# Patient Record
Sex: Male | Born: 1950 | Race: White | Hispanic: No | Marital: Married | Smoking: Never smoker
Health system: Southern US, Community
[De-identification: ages and names within clinical notes are randomized; demographics above are authoritative.]

## PROBLEM LIST (undated history)

## (undated) DIAGNOSIS — M1990 Unspecified osteoarthritis, unspecified site: Secondary | ICD-10-CM

## (undated) DIAGNOSIS — Z87438 Personal history of other diseases of male genital organs: Secondary | ICD-10-CM

## (undated) DIAGNOSIS — H269 Unspecified cataract: Secondary | ICD-10-CM

## (undated) DIAGNOSIS — N529 Male erectile dysfunction, unspecified: Secondary | ICD-10-CM

## (undated) DIAGNOSIS — K635 Polyp of colon: Secondary | ICD-10-CM

## (undated) DIAGNOSIS — C4492 Squamous cell carcinoma of skin, unspecified: Secondary | ICD-10-CM

## (undated) DIAGNOSIS — I2510 Atherosclerotic heart disease of native coronary artery without angina pectoris: Secondary | ICD-10-CM

## (undated) DIAGNOSIS — K219 Gastro-esophageal reflux disease without esophagitis: Secondary | ICD-10-CM

## (undated) DIAGNOSIS — A6920 Lyme disease, unspecified: Secondary | ICD-10-CM

## (undated) HISTORY — DX: Atherosclerotic heart disease of native coronary artery without angina pectoris: I25.10

## (undated) HISTORY — DX: Lyme disease, unspecified: A69.20

## (undated) HISTORY — DX: Squamous cell carcinoma of skin, unspecified: C44.92

## (undated) HISTORY — PX: UPPER GASTROINTESTINAL ENDOSCOPY: SHX188

## (undated) HISTORY — DX: Male erectile dysfunction, unspecified: N52.9

## (undated) HISTORY — DX: Polyp of colon: K63.5

## (undated) HISTORY — DX: Gastro-esophageal reflux disease without esophagitis: K21.9

## (undated) HISTORY — DX: Unspecified cataract: H26.9

## (undated) HISTORY — DX: Personal history of other diseases of male genital organs: Z87.438

## (undated) HISTORY — DX: Unspecified osteoarthritis, unspecified site: M19.90

## (undated) HISTORY — PX: TONSILLECTOMY: SUR1361

---

## 1990-08-16 HISTORY — PX: LUMBAR DISC SURGERY: SHX700

## 1995-08-17 HISTORY — PX: VARICOSE VEIN SURGERY: SHX832

## 2000-08-16 HISTORY — PX: TUMOR REMOVAL: SHX12

## 2004-08-16 HISTORY — PX: HEMICOLECTOMY: SHX854

## 2005-02-05 ENCOUNTER — Encounter: Payer: Self-pay | Admitting: Gastroenterology

## 2005-02-15 ENCOUNTER — Encounter: Payer: Self-pay | Admitting: Gastroenterology

## 2005-03-01 ENCOUNTER — Encounter: Payer: Self-pay | Admitting: Gastroenterology

## 2005-11-22 ENCOUNTER — Encounter: Payer: Self-pay | Admitting: Gastroenterology

## 2005-11-26 ENCOUNTER — Encounter: Payer: Self-pay | Admitting: Gastroenterology

## 2006-08-16 HISTORY — PX: HERNIA REPAIR: SHX51

## 2008-09-26 ENCOUNTER — Ambulatory Visit: Payer: Self-pay | Admitting: Family Medicine

## 2008-09-26 DIAGNOSIS — J309 Allergic rhinitis, unspecified: Secondary | ICD-10-CM | POA: Insufficient documentation

## 2008-09-26 DIAGNOSIS — K219 Gastro-esophageal reflux disease without esophagitis: Secondary | ICD-10-CM

## 2008-09-26 DIAGNOSIS — Z860100 Personal history of colon polyps, unspecified: Secondary | ICD-10-CM | POA: Insufficient documentation

## 2008-09-26 DIAGNOSIS — F528 Other sexual dysfunction not due to a substance or known physiological condition: Secondary | ICD-10-CM

## 2008-09-26 DIAGNOSIS — R5383 Other fatigue: Secondary | ICD-10-CM

## 2008-09-26 DIAGNOSIS — R5381 Other malaise: Secondary | ICD-10-CM | POA: Insufficient documentation

## 2008-12-02 ENCOUNTER — Ambulatory Visit: Payer: Self-pay | Admitting: Family Medicine

## 2008-12-02 LAB — CONVERTED CEMR LAB
ALT: 18 units/L (ref 0–53)
BUN: 12 mg/dL (ref 6–23)
Basophils Absolute: 0 10*3/uL (ref 0.0–0.1)
Basophils Relative: 0.3 % (ref 0.0–3.0)
CO2: 30 meq/L (ref 19–32)
Chloride: 109 meq/L (ref 96–112)
Cholesterol: 118 mg/dL (ref 0–200)
Creatinine, Ser: 1.1 mg/dL (ref 0.4–1.5)
HCT: 42.6 % (ref 39.0–52.0)
Hemoglobin: 14.3 g/dL (ref 13.0–17.0)
Lymphs Abs: 1.1 10*3/uL (ref 0.7–4.0)
MCHC: 33.7 g/dL (ref 30.0–36.0)
Monocytes Relative: 8.3 % (ref 3.0–12.0)
Neutro Abs: 2 10*3/uL (ref 1.4–7.7)
RDW: 13.8 % (ref 11.5–14.6)
Total CHOL/HDL Ratio: 6
Total Protein: 6.7 g/dL (ref 6.0–8.3)
Triglycerides: 98 mg/dL (ref 0.0–149.0)

## 2008-12-14 HISTORY — PX: ESOPHAGOGASTRODUODENOSCOPY: SHX1529

## 2008-12-14 HISTORY — PX: COLONOSCOPY: SHX174

## 2008-12-16 ENCOUNTER — Ambulatory Visit: Payer: Self-pay | Admitting: Family Medicine

## 2008-12-16 DIAGNOSIS — K2270 Barrett's esophagus without dysplasia: Secondary | ICD-10-CM

## 2008-12-16 HISTORY — DX: Barrett's esophagus without dysplasia: K22.70

## 2009-01-03 ENCOUNTER — Ambulatory Visit: Payer: Self-pay | Admitting: Gastroenterology

## 2009-01-08 ENCOUNTER — Ambulatory Visit: Payer: Self-pay | Admitting: Gastroenterology

## 2009-01-08 ENCOUNTER — Encounter: Payer: Self-pay | Admitting: Gastroenterology

## 2009-01-13 ENCOUNTER — Encounter: Payer: Self-pay | Admitting: Gastroenterology

## 2009-04-07 ENCOUNTER — Ambulatory Visit: Payer: Self-pay | Admitting: Family Medicine

## 2009-04-07 DIAGNOSIS — H6980 Other specified disorders of Eustachian tube, unspecified ear: Secondary | ICD-10-CM

## 2010-03-30 ENCOUNTER — Encounter: Payer: Self-pay | Admitting: Family Medicine

## 2010-03-31 ENCOUNTER — Ambulatory Visit: Payer: Self-pay | Admitting: Internal Medicine

## 2010-03-31 DIAGNOSIS — R0789 Other chest pain: Secondary | ICD-10-CM | POA: Insufficient documentation

## 2010-04-01 ENCOUNTER — Ambulatory Visit: Payer: Self-pay | Admitting: Cardiovascular Disease

## 2010-09-15 NOTE — Assessment & Plan Note (Signed)
Summary: 2:15 chest pain x 1 week/alc   Vital Signs:  Patient profile:   60 year old male Height:      73 inches Weight:      236 pounds BMI:     31.25 Temp:     98.1 degrees F oral Pulse rate:   60 / minute Pulse rhythm:   regular BP sitting:   138 / 80  (left arm) Cuff size:   large  Vitals Entered By: Selena Batten Dance CMA Duncan Dull) (March 31, 2010 12:02 PM) CC: Chest pain x1 week (intermitant)   History of Present Illness: CC: chest pain  60 yo with h/o GERD with barrett's presents with 1 wk h/o intermittent chest pain.  Dull in nature, substernal.  Occasional twinge in jaw, no pain down arm.  No nausea/sweating/SOB.  Not exertional, not relieved by rest.  Somewhat positional.  Usually occurs at rest.  Lasts a few hours when he does get it.  In AM completely pain free.  Has tried naprosyn, aspirin.  currently 1-2/10 dull pain.  at worst, 4/10.  Risk factors - current BP today 138/80.  + father with MI/CAD age 41yo (obesity), uncles with MIs.  No smoking.  No HTN, HLD.  + low HDL 22.  No DM, CRI.  Had nuclear study done 10 years ago for similar sxs, normal per patient.  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  + mobic really helped in past.  + right hand swelling in AM.  GERD usually controlled with 40 mg omeprazole daily.  No recent GERD flares.  1 wk ago had bug bite right leg that blistered then scabbed, then cluster of blisters developed right inner thigh, now returning to normal.  No ticks seen.  Not pruritic.  No other rashes.  + C spine arthralgias.  No knee or other joint pains.  No HA, abd pain, nausea/vomiting, changes in BM.  Allergies: 1)  Pcn  Past History:  Past medical, surgical, family and social histories (including risk factors) reviewed for relevance to current acute and chronic problems.  Past Medical History: Reviewed history from 01/03/2009 and no changes required. Arthritis Allergic rhinitis colon polyps ( severe dysplasia in a  right-sided polyp removed surgically in [place], 2006, final pathology showed this was not a cancer), followup colonoscopy at one-year interval was normal. GERD, h/o Barrett's esopphagus, originally diagnosed 2006, biopsies showed no dysplasia.. followup EGD 2007 also showed no dysplasia L4-L5 disc 1992 Lyme Disease x 2 Prostate infection Erectile Dysfunction h/o BPH  Past Surgical History: Reviewed history from 01/15/2009 and no changes required. Tonsillectomy (as a child) Hernia surgery 2008 Fatty tumor (neck) 2002 L4-5 diskectomy, 1992 Varicose vein removal, 1997, R right hemicolectomy 2006 for severely dysplastic right colon polyp  Colonoscopy 12/2008, repeat 5 years EGD, 12/2008, Barrett's, repeat 3 years  Family History: Reviewed history from 01/03/2009 and no changes required. Family History Diabetes 1st degree relative (parent) Family History Hypertension (parent) Fam Hx of Lymphoma(parent) Fam Hx of Heart Disease(parent) Family History of Alcoholism/Addiction (grandparent)   Social History: Reviewed history from 01/03/2009 and no changes required. Occupation:Pastor, [employer] Married Never Smoked Alcohol use-no Drug use-no  Regular exercise-yes  Review of Systems       per HPI  Physical Exam  General:  Well-developed,well-nourished,in no acute distress; alert,appropriate and cooperative throughout examination Head:  Normocephalic and atraumatic without obvious abnormalities. No apparent alopecia or balding. Eyes:  No corneal or conjunctival inflammation noted. EOMI. Perrla.  Mouth:  Oral  mucosa and oropharynx without lesions or exudates.  Teeth in good repair. Neck:  No deformities, masses, or tenderness noted.  no bruits, no LAD Lungs:  Normal respiratory effort, chest expands symmetrically. Lungs are clear to auscultation, no crackles or wheezes. Heart:  Normal rate and regular rhythm. S1 and S2 normal without gallop, murmur, click, rub or other extra  sounds. Abdomen:  Bowel sounds positive,abdomen soft and non-tender without masses, organomegaly or hernias noted.  + tender epigastric region to palpation Pulses:  2+ periph pulses Extremities:  No clubbing, cyanosis, edema, or deformity noted with normal full range of motion of all joints.   Skin:  right inner leg with scabbed blister, right inner thigh with cluster of rubptured blisters with dy blood, not erythematous, not pruritic.   Impression & Recommendations:  Problem # 1:  CHEST PAIN, ATYPICAL (ICD-786.59) atypical sxs in patient with low cardiac risk.  GI cocktail didn't change sxs so doubt coming from GI system.  Sounds more MSK given positional and worse pain in thoracic back.  Pain not consistent with dissecting aorta.  However, given age and low HDL, send to Cards for ETT this week.  Red flags to seek urgent medical care reviewed.  Trial of flexeril and mobic once cardiac r/o.  EKG - sinus bradycardia, normal axis, intervals, no hypertrophy, no infarct.  Orders: EKG w/ Interpretation (93000) EMR miscellaneous medications [facility]) Cardiology Referral (Cardiology)  Complete Medication List: 1)  Omeprazole 40 Mg Cpdr (Omeprazole) .... Take 1 tablet by mouth once a day 2)  Folic Acid 1 Mg Tabs (Folic acid) .... Take 1 tablet by mouth once a day 3)  Saw Palmetto 80 Mg Caps (Saw palmetto (serenoa repens)) .... One tablet by mouth once daily 4)  Mobic 7.5 Mg Tabs (Meloxicam) .... One daily for back pain 5)  Cyclobenzaprine Hcl 10 Mg Tabs (Cyclobenzaprine hcl) .... One by mouth two times a day as needed muscle spasm  Patient Instructions: 1)  It doesn't sound like it is the heart, but we will set you up with referral for cardiology appointment for an exercise stress test. 2)  If that one is low risk, we can try trial of mobic for the pain.  flexeril for back.  (prescriptions given today) 3)  Pleasure to see you today.  Call clinic with questions. Prescriptions: CYCLOBENZAPRINE  HCL 10 MG TABS (CYCLOBENZAPRINE HCL) one by mouth two times a day as needed muscle spasm  #30 x 0   Entered and Authorized by:   Eustaquio Boyden  MD   Signed by:   Eustaquio Boyden  MD on 03/31/2010   Method used:   Print then Give to Patient   RxID:   [id number] MOBIC 7.5 MG TABS (MELOXICAM) one daily for back pain  #30 x 0   Entered and Authorized by:   Eustaquio Boyden  MD   Signed by:   Eustaquio Boyden  MD on 03/31/2010   Method used:   Print then Give to Patient   RxID:   [id number]   Current Allergies (reviewed today): PCN   Medication Administration  Medication # 1:    Medication: EMR miscellaneous medications    Diagnosis: CHEST PAIN, ATYPICAL (ICD-786.59)    Dose: 45cc    Route: oral    Exp Date: 04/30/2010    Comments: GI Cocktail (30cc Mylanta, 5cc Benadryl, 10cc Donnatol) per Dr. Sharen Hones    Patient tolerated medication without complications    Given by: Selena Batten Dance CMA Duncan Dull) (March 31, 2010 1:11  PM)  Orders Added: 1)  EKG w/ Interpretation [93000] 2)  Est. Patient Level IV [81191] 3)  EMR miscellaneous medications [EMRORAL] 4)  Cardiology Referral [Cardiology]

## 2010-09-15 NOTE — Assessment & Plan Note (Signed)
Summary: NP6/AMD   Visit Type:  Initial Consult Referring Alliyah Roesler:  Hannah Beat, MD Primary Athol Bolds:  Hannah Beat, MD  CC:  c/o chest pain off & on for the past week or so.Marland Kitchen  History of Present Illness: very pleasant 60 year old gentleman with a history of arthritis in his back, colon resection and ventral hernia repair in 2006, EGD at that time showing Barrett's esophagus, also history of GERD who presents for evaluation of recent chest discomfort.  He actually thought that he had a treadmill scheduled today but this was not mentioned to our office. He reports having dull, upper chest discomfort for one week that has been waxing and waning, 3-4/10 in intensity. It comes on with rest, is not exacerbated by exertion. Rarely he has gone to his jaw. He has otherwise been active with no complaints. Typically he plays golf with no symptoms of chest discomfort.   He does not believe that his symptoms are due to GI etiology. He strongly believes it might be due to arthritic pain from his back. 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  cholesterol is at an excellent level on no medications with total cholesterol less than 150   Current Medications (verified): 1)  Omeprazole 40 Mg Cpdr (Omeprazole) .... Take 1 Tablet By Mouth Once A Day 2)  Folic Acid 1 Mg Tabs (Folic Acid) .... Take 1 Tablet By Mouth Once A Day 3)  Saw Palmetto 80 Mg Caps (Saw Palmetto (Serenoa Repens)) .... One Tablet By Mouth Once Daily 4)  Mobic 7.5 Mg Tabs (Meloxicam) .... One Daily For Back Pain 5)  Cyclobenzaprine Hcl 10 Mg Tabs (Cyclobenzaprine Hcl) .... One By Mouth Two Times A Day As Needed Muscle Spasm  Allergies (verified): 1)  Pcn  Past History:  Past Medical History: Last updated: 01/03/2009 Arthritis Allergic rhinitis colon polyps ( severe dysplasia in a right-sided polyp removed surgically in [place], 2006, final  pathology showed this was not a cancer), followup colonoscopy at one-year interval was normal. GERD, h/o Barrett's esopphagus, originally diagnosed 2006, biopsies showed no dysplasia.. followup EGD 2007 also showed no dysplasia L4-L5 disc 1992 Lyme Disease x 2 Prostate infection Erectile Dysfunction h/o BPH  Past Surgical History: Last updated: 01/15/2009 Tonsillectomy (as a child) Hernia surgery 2008 Fatty tumor (neck) 2002 L4-5 diskectomy, 1992 Varicose vein removal, 1997, R right hemicolectomy 2006 for severely dysplastic right colon polyp  Colonoscopy 12/2008, repeat 5 years EGD, 12/2008, Barrett's, repeat 3 years  Family History: Last updated: 01/03/2009 Family History Diabetes 1st degree relative (parent) Family History Hypertension (parent) Fam Hx of Lymphoma(parent) Fam Hx of Heart Disease(parent) Family History of Alcoholism/Addiction (grandparent)   Social History: Last updated: 01/03/2009 Occupation:Pastor, [employer]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  The patient denies fever, weight loss, weight gain, vision loss, decreased hearing, hoarseness, syncope, dyspnea on exertion, peripheral edema, prolonged cough, abdominal pain, incontinence, muscle weakness, depression, and enlarged lymph nodes.    Vital Signs:  Patient profile:   60 year old male Height:      73 inches Weight:      232 pounds BMI:     30.72 Pulse rate:   66 / minute BP sitting:   125 / 81  (right  arm) Cuff size:   regular  Vitals Entered By: Bishop Dublin, CMA (April 01, 2010 11:25 AM)  Physical Exam  General:  Well developed, well nourished, in no acute distress. Head:  normocephalic and atraumatic Neck:  Neck supple, no JVD. No masses, thyromegaly or abnormal  cervical nodes. Lungs:  Clear bilaterally to auscultation and percussion. Heart:  Non-displaced PMI, chest non-tender; regular rate and rhythm, S1, S2 without murmurs, rubs or gallops. Carotid upstroke normal, no bruit. Normal abdominal aortic size, no bruits.  Pedals normal pulses. No edema, no varicosities. Abdomen:   abdomen soft and non-tender without masses,  Msk:  Back normal, normal gait. Muscle strength and tone normal. Pulses:  pulses normal in all 4 extremities Extremities:  No clubbing or cyanosis. Neurologic:  Alert and oriented x 3. Skin:  Intact without lesions or rashes. Psych:  Normal affect.   Impression & Recommendations:  Problem # 1:  CHEST PAIN, ATYPICAL (ICD-786.59) etiology of his chest pain is uncertain. He does have a history of Barrett's esophagus though this was 5 years ago and he reports the symptoms are not similar. His presentation is atypical as it comes on at rest, is not worse with exertion. We have suggested that he try his Mobic for his arthritis of the back. If his symptoms resolve, we could hold off on performing a treadmill study. If he continues to have symptoms on Mobic, we could have him perform a treadmill study at [facility].  It is certainly possible that this could be pericarditis as he does have nonspecific ST changes in the precordial leads, but my suspicion is that this is due to early repolarization abnormality. There is no positional nature to his discomfort per his report, arguing against pericarditis. This would be treated with high-dose ibuprofen or aspirin. Starting Mobic and a recommendation to start low-dose aspirin could help if this is due to pericarditis.   Patient Instructions: 1)  Your physician recommends that you continue on your current medications as directed. Please refer to the Current Medication list given to you today. 2)  Your physician wants you to follow-up in:   6 months You will receive a reminder letter in the mail two  months in advance. If you don't receive a letter, please call our office to schedule the follow-up appointment.

## 2010-09-15 NOTE — Letter (Signed)
Summary: PHI  PHI   Imported By: Harlon Flor 04/02/2010 15:23:17  _____________________________________________________________________  External Attachment:    Type:   Image     Comment:   External Document

## 2011-02-22 ENCOUNTER — Encounter: Payer: Self-pay | Admitting: Cardiovascular Disease

## 2011-09-02 ENCOUNTER — Ambulatory Visit (INDEPENDENT_AMBULATORY_CARE_PROVIDER_SITE_OTHER): Payer: BC Managed Care – PPO | Admitting: Family Medicine

## 2011-09-02 ENCOUNTER — Encounter: Payer: Self-pay | Admitting: Family Medicine

## 2011-09-02 DIAGNOSIS — D696 Thrombocytopenia, unspecified: Secondary | ICD-10-CM

## 2011-09-02 DIAGNOSIS — M2550 Pain in unspecified joint: Secondary | ICD-10-CM

## 2011-09-02 LAB — RHEUMATOID FACTOR: Rhuematoid fact SerPl-aCnc: 12 IU/mL (ref <=14)

## 2011-09-02 LAB — HIGH SENSITIVITY CRP: CRP, High Sensitivity: 1.1 mg/L (ref 0.000–5.000)

## 2011-09-02 LAB — SEDIMENTATION RATE: Sed Rate: 6 mm/hr (ref 0–22)

## 2011-09-02 NOTE — Progress Notes (Signed)
  Patient Name: Phillip [name] Date of Birth: 01-09-1951 Age: 61 y.o. Medical Record Number: [id number] Gender: male Date of Encounter: 09/02/2011  History of Present Illness:  Phillip [name] is a 61 y.o. 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   A few years ago, there was some concerns about RA. Some of the work-up was positive, but right hand has been swelling at night. Stiff in the morning. Will loosen up. Lasting at least an hour but not fully sure on time. WIll have some boggy MCP's Will have some occ flare up. More with gripping.  Even better the last few days.  R > L He was seeing a rheumatologist a number of years ago - at first felt RA, then maybe not  Plays a lot of golf at least a few times a week. Cut some wood the other day.  Also will have some edema occ in the LE. Extensive varicose veins  CBC:    Component Value Date/Time   WBC 3.5* 12/02/2008 1036   HGB 14.3 12/02/2008 1036   HCT 42.6 12/02/2008 1036   PLT 109.0* 12/02/2008 1036   MCV 88.5 12/02/2008 1036   NEUTROABS 2.0 12/02/2008 1036   LYMPHSABS 1.1 12/02/2008 1036   MONOABS 0.3 12/02/2008 1036   EOSABS 0.1 12/02/2008 1036   BASOSABS 0.0 12/02/2008 1036   at fire dept physical, had thrombocytopenia with normal # WBC, normal distribution of WBC types.  Mom died of lymphoma at 24.  Spot on the back on his left ear. Recent boil  Spot in abdomen. Not particularly painful, s/p hernia repair with mesh. Rare mild pain but no bulging.  Past Medical History, Surgical History, Social History, Family History, Problem List, Medications, and Allergies have been reviewed and updated if relevant.  Review of Systems: As above. No cp, sob. No fever.   Physical Examination: Filed Vitals:   09/02/11 1350  BP: 130/82  Pulse: 63  Temp: 98.9 F (37.2 C)  TempSrc: Oral  Height: 6\' 1"  (1.854 m)  Weight: 232 lb 12.8 oz (105.597 kg)  SpO2: 100%    Body mass index  is 30.71 kg/(m^2).   GEN: WDWN, NAD, Non-toxic, A & O x 3 HEENT: Atraumatic, Normocephalic. Neck supple. No masses, No LAD. Ears and Nose: No external deformity. CV: RRR, No M/G/R. No JVD. No thrill. No extra heart sounds. PULM: CTA B, no wheezes, crackles, rhonchi. No retractions. No resp. distress. No accessory muscle use. MSK: R MCP's are boggy with moderate synovitis, 2-5. None at DIP or PIP. Normal wrist ROM EXTR: No c/c/e NEURO Normal gait.  PSYCH: Normally interactive. Conversant. Not depressed or anxious appearing.  Calm demeanor.   Skin: full check, few seb k's and hemangiomas  Assessment and Plan: 1. Arthralgia  Sedimentation rate, High sensitivity CRP, Cyclic citrul peptide antibody, IgG, Rheumatoid factor, Uric acid  2. Thrombocytopenia  Pathologist smear review    >25 minutes spent in face to face time with patient, >50% spent in counselling or coordination of care: reviewed all labs with patient, answered questions. + FH mother with lymphoma with persistent thrombocytopenia. Check peripheral smear first and review. Reviewed various rheum disease - ? If systemic vs oa

## 2011-09-03 LAB — CYCLIC CITRUL PEPTIDE ANTIBODY, IGG: Cyclic Citrullin Peptide Ab: <2 U/mL (ref 0.0–5.0)

## 2011-09-06 ENCOUNTER — Telehealth: Payer: Self-pay | Admitting: Radiology

## 2011-09-06 LAB — PATHOLOGIST SMEAR REVIEW

## 2011-09-06 NOTE — Telephone Encounter (Signed)
[facility] called to inform us that the Pathology Review was never done., now sample is to old for them to do the test. It was ordered and sent correctly.

## 2011-09-07 ENCOUNTER — Encounter: Payer: Self-pay | Admitting: *Deleted

## 2011-09-07 NOTE — Telephone Encounter (Signed)
Can you call pt, explain that the lab unfortunately did not do the smear in a timely manner, so the blood sample is too old now to do.  I would recommend that he come again at his convenience for a redraw. At that time, do now charge any "draw" or facility fee.   Please let know I am sorry - there is not much else we can say except it was a lab mistake.

## 2011-09-10 ENCOUNTER — Telehealth: Payer: Self-pay | Admitting: Family Medicine

## 2011-09-13 NOTE — Telephone Encounter (Signed)
discussed

## 2011-09-13 NOTE — Telephone Encounter (Signed)
Patient wanted results of smear and I told him as soon as Dr. Patsy Lager has chance to look at i would call him with results

## 2011-11-24 ENCOUNTER — Encounter: Payer: Self-pay | Admitting: Gastroenterology

## 2011-12-14 ENCOUNTER — Encounter: Payer: Self-pay | Admitting: Family Medicine

## 2011-12-14 ENCOUNTER — Ambulatory Visit (INDEPENDENT_AMBULATORY_CARE_PROVIDER_SITE_OTHER): Payer: BC Managed Care – PPO | Admitting: Family Medicine

## 2011-12-14 VITALS — BP 130/90 | HR 76 | Temp 97.9°F | Wt 231.0 lb

## 2011-12-14 DIAGNOSIS — J029 Acute pharyngitis, unspecified: Secondary | ICD-10-CM

## 2011-12-14 MED ORDER — AMOXICILLIN 500 MG PO CAPS: 1000.0000 mg | ORAL_CAPSULE | Freq: Two times a day (BID) | ORAL | Status: AC

## 2011-12-14 NOTE — Progress Notes (Signed)
Subjective:    Patient ID: Phillip [name], male    DOB: 1951-03-18, 61 y.o.   MRN: [id number]  HPI 61 yo male here for ? Strep throat. Wife Harriett Sine, here yesterday, diagnosed with strep and they have had same symptoms.  Over 1.5 weeks of sore throat, now having sinus pressure and tooth pain.  Dry cough. Using OTC antihistamine and Tylenol.  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  Patient Active Problem List  Diagnoses  . ERECTILE DYSFUNCTION  . ALLERGIC RHINITIS  . GERD  . BARRETTS ESOPHAGUS  . COLONIC POLYPS, HX OF   Past Medical History  Diagnosis Date  . Arthritis   . Allergic rhinitis   . Colon polyps     Severe dysplasia in a right-sided polyp removed surgically in [place], 2006; final pathology showed this was not a cancer; follow up colonoscopy at one year interval was normal  . GERD (gastroesophageal reflux disease)     H/o Barrett's esophagus, orginally diagnosed in 2006, biopsies showed no dysplasia; follow up EGD also showed no dysplasia  . Lyme disease     x2  . Prostate infection   . Erectile dysfunction   . History of BPH    Past Surgical History  Procedure Date  . Tonsillectomy     As a child  . Hernia repair 2008  . Tumor removal 2002    Fatty tumor (neck)  . Lumbar disc surgery 1992    L4-5  . Varicose vein surgery 1997    Removal, right  . Hemicolectomy 2006    Right, for severely dysplastic right colon polyp  . Colonoscopy 12/2008    Repeat 5 years  . Esophagogastroduodenoscopy 12/2008    Barrett's, repeat 3 years   History  Substance Use Topics  . Smoking status: Never Smoker   . Smokeless tobacco: Not on file  . Alcohol Use: No   Family History  Problem Relation Age of Onset  . Hypertension Other     Parent  . Diabetes Other     Parent  . Lymphoma Other     Parent  . Alcohol abuse Other     Grandparent  . Heart disease Other     Parent   Allergies  Allergen Reactions  . Penicillins    REACTION: Rash as a child   Current Outpatient Prescriptions on File Prior to Visit  Medication Sig Dispense Refill  . folic acid (FOLVITE) 1 MG tablet Take 1 mg by mouth daily.        Marland Kitchen omeprazole (PRILOSEC) 40 MG capsule Take 40 mg by mouth daily.        . saw palmetto 80 MG capsule Take 80 mg by mouth daily.         The PMH, PSH, Social History, Family History, Medications, and allergies have been reviewed in [facility], and have been updated if relevant.    Review of Systems    See HPI No fever No n/v/d Objective:   Physical Exam BP 130/90  Pulse 76  Temp(Src) 97.9 F (36.6 C) (Oral)  Wt 231 lb (104.781 kg) General:  overweght male in NAD Eyes:  PERRL Ears:  External ear exam shows no significant lesions or deformities.  Otoscopic examination reveals clear canals, tympanic membranes are intact bilaterally without bulging, retraction, inflammation or discharge. Hearing is grossly normal bilaterally. Nose:  Pos bilateral boggy turbinates, sinuses +/- TTP Mouth:  Pos pharyngeal erythema, no exudate,Teeth in good repair. Neck:  no carotid bruit or thyromegaly no cervical or supraclavicular lymphadenopathy  Lungs:  Normal respiratory effort, chest expands symmetrically. Lungs are clear to auscultation, no crackles or wheezes. Heart:  Normal rate and regular rhythm. S1 and S2 normal without gallop, murmur, click, rub or other extra sounds. Pulses:  R and L posterior tibial pulses are full and equal bilaterally  Extremities:  no edema      Assessment & Plan:   1. Sore throat  POCT rapid strep A   New- rapid strep neg but wife with diagnosed strep. Also probable developing bacterial sinusitis. States that PCN allergy was from childhood and has had amoxicillin without difficulty multiple times. Will treat with amoxicillin. The patient indicates understanding of these issues and agrees with the plan.

## 2011-12-16 ENCOUNTER — Encounter: Payer: Self-pay | Admitting: Family Medicine

## 2011-12-16 ENCOUNTER — Ambulatory Visit (INDEPENDENT_AMBULATORY_CARE_PROVIDER_SITE_OTHER): Payer: BC Managed Care – PPO | Admitting: Family Medicine

## 2011-12-16 VITALS — BP 120/72 | HR 106 | Temp 98.9°F | Ht 73.0 in | Wt 226.8 lb

## 2011-12-16 DIAGNOSIS — H109 Unspecified conjunctivitis: Secondary | ICD-10-CM

## 2011-12-16 MED ORDER — POLYMYXIN B-TRIMETHOPRIM 10000-0.1 UNIT/ML-% OP SOLN: 1.0000 [drp] | OPHTHALMIC | Status: AC

## 2011-12-16 NOTE — Progress Notes (Signed)
  Patient Name: Phillip [name] Date of Birth: [birth year] Age: 61 y.o. Medical Record Number: [id number] Gender: male Date of Encounter: 12/16/2011  History of Present Illness:  Phillip [name] is a 61 y.o. very pleasant male patient who presents with the following:  Pink eye: Pleasant gentleman awoke with right-sided crusting very much, and his left side is also now becoming itchy. Both are pinkish in the conjunctiva. No known contacts.  Past Medical History, Surgical History, Social History, Family History, Problem List, Medications, and Allergies have been reviewed and updated if relevant.  Review of Systems: ROS: GEN: Acute illness details above GI: Tolerating PO intake GU: maintaining adequate hydration and urination Pulm: No SOB Interactive and getting along well at home.  Otherwise, ROS is as per the HPI.   Physical Examination: Filed Vitals:   12/16/11 1052  BP: 120/72  Pulse: 106  Temp: 98.9 F (37.2 C)  TempSrc: Oral  Height: 6\' 1"  (1.854 m)  Weight: 226 lb 12.8 oz (102.876 kg)  SpO2: 99%    Body mass index is 29.92 kg/(m^2).   GEN: WDWN, NAD, Non-toxic, Alert & Oriented x 3 HEENT: Atraumatic, Normocephalic.  Crusting on the right eyelid. Extraocular movements are intact. Conjunctiva, particularly on the right is quite injected Ears and Nose: No external deformity. EXTR: No clubbing/cyanosis/edema NEURO: Normal gait.  PSYCH: Normally interactive. Conversant. Not depressed or anxious appearing.  Calm demeanor.    Assessment and Plan:  1. Conjunctivitis     Orders Today: No orders of the defined types were placed in this encounter.    Medications Today: Meds ordered this encounter  Medications  . aspirin 81 MG tablet    Sig: Take 81 mg by mouth daily.  Marland Kitchen trimethoprim-polymyxin b (POLYTRIM) ophthalmic solution    Sig: Place 1 drop into both eyes every 4 (four) hours.    Dispense:  10 mL    Refill:  0     Pink eye --- ok if his wife calls in  with pink eye to call her in some eye drops without being seen.

## 2012-04-10 ENCOUNTER — Other Ambulatory Visit (INDEPENDENT_AMBULATORY_CARE_PROVIDER_SITE_OTHER): Payer: BC Managed Care – PPO

## 2012-04-10 DIAGNOSIS — M129 Arthropathy, unspecified: Secondary | ICD-10-CM

## 2012-04-10 DIAGNOSIS — M1990 Unspecified osteoarthritis, unspecified site: Secondary | ICD-10-CM

## 2012-04-10 LAB — CBC WITH DIFFERENTIAL/PLATELET
Basophils Absolute: 0 10*3/uL (ref 0.0–0.1)
Eosinophils Absolute: 0.1 10*3/uL (ref 0.0–0.7)
Lymphs Abs: 1.5 10*3/uL (ref 0.7–4.0)
MCHC: 32.3 g/dL (ref 30.0–36.0)
MCV: 90.9 fl (ref 78.0–100.0)
Monocytes Absolute: 0.4 10*3/uL (ref 0.1–1.0)
Neutrophils Relative %: 53.6 % (ref 43.0–77.0)
Platelets: 120 10*3/uL — ABNORMAL LOW (ref 150.0–400.0)
RDW: 14.8 % — ABNORMAL HIGH (ref 11.5–14.6)
WBC: 4.3 10*3/uL — ABNORMAL LOW (ref 4.5–10.5)

## 2012-04-19 ENCOUNTER — Encounter: Payer: Self-pay | Admitting: Family Medicine

## 2012-04-19 ENCOUNTER — Ambulatory Visit (INDEPENDENT_AMBULATORY_CARE_PROVIDER_SITE_OTHER): Payer: BC Managed Care – PPO | Admitting: Family Medicine

## 2012-04-19 ENCOUNTER — Telehealth: Payer: Self-pay | Admitting: Gastroenterology

## 2012-04-19 VITALS — BP 118/70 | HR 76 | Temp 98.1°F | Wt 233.0 lb

## 2012-04-19 DIAGNOSIS — D696 Thrombocytopenia, unspecified: Secondary | ICD-10-CM | POA: Insufficient documentation

## 2012-04-19 DIAGNOSIS — R109 Unspecified abdominal pain: Secondary | ICD-10-CM

## 2012-04-19 NOTE — Progress Notes (Signed)
Nature conservation officer at [facility] [address] [place] [address] Phone: [phone] Fax: [phone]  Date:  04/19/2012   Name:  Phillip [name]   DOB:  21-Apr-1951   MRN:  [id number] Gender: male Age: 61 y.o.  PCP:  Hannah Beat, MD    Chief Complaint: Follow up after labs. and Pain in right side- follow up   History of Present Illness:  Phillip [name] is a 61 y.o. very pleasant male patient who presents with the following:  Intermittent right-sided abdominal pain over the last 2 months. It has been intermittent off and on before then, but it has been worsening over that time.  He denies any black color stools or bloody stools. No nausea. No diarrhea.  Pain in side, a couple of weeks before the trip, was had some discomfort, felt like maybe pulled his a muscles, and pain seems to be separate there. Also will take some NASAIDS frequently. Felt fairly bad a few weeks ago. To the point of feeling short of breath. Was pretty bad and got up there, mowed yard, felt ok.  Repeat Endo recall right now. H/o Barrett's.  Also with a history of thrombocytopenia, CBC reviewed with the patient. Also his peripheral smear from 6 months ago is reviewed with the patient. It has been globally improving, both his white blood cell count and his platelet count over time.  Patient Active Problem List  Diagnosis  . ERECTILE DYSFUNCTION  . ALLERGIC RHINITIS  . GERD  . BARRETTS ESOPHAGUS  . COLONIC POLYPS, HX OF  . Thrombocytopenia    Past Medical History  Diagnosis Date  . Arthritis   . Allergic rhinitis   . Colon polyps     Severe dysplasia in a right-sided polyp removed surgically in [place], 2006; final pathology showed this was not a cancer; follow up colonoscopy at one year interval was normal  . GERD (gastroesophageal reflux disease)     H/o Barrett's esophagus, orginally diagnosed in 2006, biopsies showed no dysplasia; follow up EGD also showed no dysplasia  . Lyme  disease     x2  . Prostate infection   . Erectile dysfunction   . History of BPH     Past Surgical History  Procedure Date  . Tonsillectomy     As a child  . Hernia repair 2008  . Tumor removal 2002    Fatty tumor (neck)  . Lumbar disc surgery 1992    L4-5  . Varicose vein surgery 1997    Removal, right  . Hemicolectomy 2006    Right, for severely dysplastic right colon polyp  . Colonoscopy 12/2008    Repeat 5 years  . Esophagogastroduodenoscopy 12/2008    Barrett's, repeat 3 years    History  Substance Use Topics  . Smoking status: Never Smoker   . Smokeless tobacco: Not on file  . Alcohol Use: No    Family History  Problem Relation Age of Onset  . Hypertension Other     Parent  . Diabetes Other     Parent  . Lymphoma Other     Parent  . Alcohol abuse Other     Grandparent  . Heart disease Other     Parent    Allergies  Allergen Reactions  . Penicillins     REACTION: Rash as a child    Current Outpatient Prescriptions on File Prior to Visit  Medication Sig Dispense Refill  . aspirin 81 MG tablet Take 81 mg  by mouth daily.      . folic acid (FOLVITE) 1 MG tablet Take 1 mg by mouth daily.        Marland Kitchen omeprazole (PRILOSEC) 40 MG capsule Take 40 mg by mouth daily.        . saw palmetto 80 MG capsule Take 80 mg by mouth daily.           Past Medical History, Surgical History, Social History, Family History, Problem List, Medications, and Allergies have been reviewed and updated if relevant.  Current Outpatient Prescriptions on File Prior to Visit  Medication Sig Dispense Refill  . aspirin 81 MG tablet Take 81 mg by mouth daily.      . folic acid (FOLVITE) 1 MG tablet Take 1 mg by mouth daily.        Marland Kitchen omeprazole (PRILOSEC) 40 MG capsule Take 40 mg by mouth daily.        . saw palmetto 80 MG capsule Take 80 mg by mouth daily.          Review of Systems: ROS: GEN: Acute illness details above GI: Tolerating PO intake, above GU: maintaining adequate  hydration and urination Pulm: No SOB Interactive and getting along well at home.  Otherwise, ROS is as per the HPI.   Physical Examination: Filed Vitals:   04/19/12 1220  BP: 118/70  Pulse: 76  Temp: 98.1 F (36.7 C)   Filed Vitals:   04/19/12 1220  Weight: 233 lb (105.688 kg)   There is no height on file to calculate BMI. Ideal Body Weight:    GEN: WDWN, NAD, Non-toxic, A & O x 3 HEENT: Atraumatic, Normocephalic. Neck supple. No masses, No LAD. Ears and Nose: No external deformity. CV: RRR, No M/G/R. No JVD. No thrill. No extra heart sounds. PULM: CTA B, no wheezes, crackles, rhonchi. No retractions. No resp. distress. No accessory muscle use. ABD: S, R sided mild tenderness R LQ and RUQ - more laterally, ND, +BS. No rebound. No HSM. EXTR: No c/c/e NEURO Normal gait.  PSYCH: Normally interactive. Conversant. Not depressed or anxious appearing.  Calm demeanor.    Assessment and Plan:  1. Abdominal  pain, other specified site : Obtain a CT of the abdomen and pelvis with contrast to evaluate for potential mass lesions, acute intra-abdominal processes, and potential diverticulitis as well as other intra-abdominal processes given history, length of time, longer than 2 months, and concerning exam.  CT Abdomen Pelvis W Contrast  2. Thrombocytopenia : Repeat repeat CBC is relatively assuring, though he does have some continued thrombocytopenia, but improved. I would continue to follow this from a laboratory follow standpoint.     CBC:    Component Value Date/Time   WBC 4.3* 04/10/2012 0929   HGB 15.1 04/10/2012 0929   HCT 46.7 04/10/2012 0929   PLT 120.0* 04/10/2012 0929   MCV 90.9 04/10/2012 0929   NEUTROABS 2.3 04/10/2012 0929   LYMPHSABS 1.5 04/10/2012 0929   MONOABS 0.4 04/10/2012 0929   EOSABS 0.1 04/10/2012 0929   BASOSABS 0.0 04/10/2012 0929     Orders Today:  Orders Placed This Encounter  Procedures  . CT Abdomen Pelvis W Contrast    Standing Status: Future     Number  of Occurrences:      Standing Expiration Date: 07/20/2013    Order Specific Question:  Preferred imaging location?    Answer:  [id number]    Order Specific Question:  Reason for exam:    Answer:  abdominal pain, R most, rule out mass lesion, acute intraabdominal process, diverticulitis    Medications Today: (Includes new updates added during medication reconciliation) No orders of the defined types were placed in this encounter.    Medications Discontinued: There are no discontinued medications.   Hannah Beat, MD

## 2012-04-19 NOTE — Telephone Encounter (Signed)
Pt has been scheduled for 05/15/12 to discuss colon with Dr Christella Hartigan

## 2012-04-19 NOTE — Patient Instructions (Addendum)
REFERRAL: GO THE THE FRONT ROOM AT THE ENTRANCE OF OUR CLINIC, NEAR CHECK IN. ASK FOR Phillip [name]. SHE WILL HELP YOU SET UP YOUR REFERRAL. DATE: TIME:  

## 2012-04-20 ENCOUNTER — Ambulatory Visit (INDEPENDENT_AMBULATORY_CARE_PROVIDER_SITE_OTHER)
Admission: RE | Admit: 2012-04-20 | Discharge: 2012-04-20 | Disposition: A | Discharge status: Home / Self Care | Payer: BC Managed Care – PPO | Source: Ambulatory Visit | Attending: Family Medicine | Admitting: Family Medicine

## 2012-04-20 DIAGNOSIS — R109 Unspecified abdominal pain: Secondary | ICD-10-CM

## 2012-04-20 MED ORDER — IOHEXOL 300 MG/ML  SOLN
100.0000 mL | Freq: Once | INTRAMUSCULAR | Status: AC | PRN
  Administered 2012-04-20: 100 mL via INTRAVENOUS

## 2012-05-15 ENCOUNTER — Ambulatory Visit (INDEPENDENT_AMBULATORY_CARE_PROVIDER_SITE_OTHER): Payer: BC Managed Care – PPO | Admitting: Gastroenterology

## 2012-05-15 ENCOUNTER — Encounter: Payer: Self-pay | Admitting: Gastroenterology

## 2012-05-15 VITALS — BP 120/80 | HR 64 | Ht 73.5 in | Wt 234.6 lb

## 2012-05-15 DIAGNOSIS — K2270 Barrett's esophagus without dysplasia: Secondary | ICD-10-CM

## 2012-05-15 DIAGNOSIS — R1011 Right upper quadrant pain: Secondary | ICD-10-CM

## 2012-05-15 NOTE — Progress Notes (Signed)
Review of pertinent gastrointestinal problems: 1.  colonoscopy in 2006, a large right sided TVA with severe dysplasia was found. this was too large to be removed endoscopically and C8 so he underwent a right hemicolectomy. Surgical pathology confirmed this was not a cancer, 18 nodes were sampled and all were negative.  Underwent second colonoscopy in 2007, clear anastomosis.  Repeat colonoscopy Christella Hartigan) 12/2008 found right hemicolectomy anastomosis, o/w normal.  Recall recommended at 5 year interval 2. Barrett's short segment:  EGD 2006 no dysplasia, EGD 2010 (jacobs) same, no dysplasia, recommended recall at 3 year interval.   HPI: This is a     very pleasant 61 year old man whom I last saw 3-4 years ago.  CT scan abdomen pelvis with IV and oral contrast September 2013 showed gallstones, otherwise normal.  Cbc last month shows slightly low plts, (chronic)  Has been having abdominal pains.  Were right sided abd pains. Has had right sided abd pains chronically, blamed constipation in the past.  Plays a lot of golf, has arthritiis    Was awoken from right sided pain at least once.  Lasted a 'few minutes' no associated nausea.  Pain radiated a bit to his back, perhaps "related to movement."  No post prandial component.   Naproxen worked.  Has never had regular BMs, but is more alternating lately.     Review of systems: Pertinent positive and negative review of systems were noted in the above HPI section. Complete review of systems was performed and was otherwise normal.    Past Medical History  Diagnosis Date  . Arthritis   . Allergic rhinitis   . Colon polyps     Severe dysplasia in a right-sided polyp removed surgically in [place], 2006; final pathology showed this was not a cancer; follow up colonoscopy at one year interval was normal  . GERD (gastroesophageal reflux disease)     H/o Barrett's esophagus, orginally diagnosed in 2006, biopsies showed no dysplasia; follow up EGD also  showed no dysplasia  . Lyme disease     x2  . Prostate infection   . Erectile dysfunction   . History of BPH     Past Surgical History  Procedure Date  . Tonsillectomy     As a child  . Hernia repair 2008  . Tumor removal 2002    Fatty tumor (neck)  . Lumbar disc surgery 1992    L4-5  . Varicose vein surgery 1997    Removal, right  . Hemicolectomy 2006    Right, for severely dysplastic right colon polyp  . Colonoscopy 12/2008    Repeat 5 years  . Esophagogastroduodenoscopy 12/2008    Barrett's, repeat 3 years    Current Outpatient Prescriptions  Medication Sig Dispense Refill  . aspirin 81 MG tablet Take 81 mg by mouth daily.      . folic acid (FOLVITE) 1 MG tablet Take 1 mg by mouth daily.        . naproxen (NAPROSYN) 250 MG tablet Take 250 mg by mouth as needed.      Marland Kitchen omeprazole (PRILOSEC) 40 MG capsule Take 40 mg by mouth daily.        . saw palmetto 80 MG capsule Take 80 mg by mouth daily.          Allergies as of 05/15/2012 - Review Complete 05/15/2012  Allergen Reaction Noted  . Penicillins      Family History  Problem Relation Age of Onset  . Hypertension Father   .  Diabetes Father   . Lymphoma Mother   . Alcohol abuse Paternal Grandfather   . Heart disease Father     History   Social History  . Marital Status: Married    Spouse Name: N/A    Number of Children: 3  . Years of Education: N/A   Occupational History  . [employer], [employer]   .     Social History Main Topics  . Smoking status: Never Smoker   . Smokeless tobacco: Never Used  . Alcohol Use: No  . Drug Use: No  . Sexually Active: Not on file   Other Topics Concern  . Not on file   Social History Narrative   MarriedGets regular exercise       Physical Exam: BP 120/80  Pulse 64  Ht 6' 1.5" (1.867 m)  Wt 234 lb 9.6 oz (106.414 kg)  BMI 30.53 kg/m2 Constitutional: generally well-appearing Psychiatric: alert and oriented x3 Eyes: extraocular movements intact Mouth:  oral pharynx moist, no lesions Neck: supple no lymphadenopathy Cardiovascular: heart regular rate and rhythm Lungs: clear to auscultation bilaterally Abdomen: soft, nontender, nondistended, no obvious ascites, no peritoneal signs, normal bowel sounds Extremities: no lower extremity edema bilaterally Skin: no lesions on visible extremities    Assessment and plan: 61 y.o. male with  right-sided abdominal pains, gallstones, history of Barrett's esophagus  First it is not clear to me that the gallstones noted by CT scan are causing his abdominal pains. The pains are clearly improved, they were not particularly postprandial. I think simply observing him clinically is reasonable and he does call here for right upper quadrant pains continue, worsen. He has known Barrett's, nondysplastic. I did not mention above but he has had no dysphagia, no weight loss, no overt GI bleeding however we should repeat Barrett's surveillance since it has been a little over 3 years.

## 2012-05-15 NOTE — Patient Instructions (Addendum)
You will be set up for an upper endoscopy for Barrett's surveillance. If your right sided abd pains worsen, please call.  Your gallstones MAY be causing the symptoms. You have been scheduled for an endoscopy with propofol. Please follow written instructions given to you at your visit today. If you use inhalers (even only as needed), please bring them with you on the day of your procedure.

## 2012-06-15 DIAGNOSIS — K2990 Gastroduodenitis, unspecified, without bleeding: Secondary | ICD-10-CM

## 2012-06-15 DIAGNOSIS — K2970 Gastritis, unspecified, without bleeding: Secondary | ICD-10-CM

## 2012-06-15 DIAGNOSIS — K2270 Barrett's esophagus without dysplasia: Secondary | ICD-10-CM

## 2012-06-16 ENCOUNTER — Encounter: Payer: Self-pay | Admitting: Gastroenterology

## 2012-06-16 ENCOUNTER — Ambulatory Visit (AMBULATORY_SURGERY_CENTER): Payer: BC Managed Care – PPO | Admitting: Gastroenterology

## 2012-06-16 VITALS — BP 122/60 | HR 55 | Temp 96.7°F | Resp 17 | Ht 73.0 in | Wt 234.0 lb

## 2012-06-16 DIAGNOSIS — K2990 Gastroduodenitis, unspecified, without bleeding: Secondary | ICD-10-CM

## 2012-06-16 DIAGNOSIS — K2270 Barrett's esophagus without dysplasia: Secondary | ICD-10-CM

## 2012-06-16 MED ORDER — SODIUM CHLORIDE 0.9 % IV SOLN: 500.0000 mL | INTRAVENOUS | Status: DC

## 2012-06-16 NOTE — Progress Notes (Signed)
Patient did not experience any of the following events: a burn prior to discharge; a fall within the facility; wrong site/side/patient/procedure/implant event; or a hospital transfer or hospital admission upon discharge from the facility. (G8907) Patient did not have preoperative order for IV antibiotic SSI prophylaxis. (G8918)  

## 2012-06-16 NOTE — Op Note (Signed)
[facility] [address] [place], [address]   ENDOSCOPY PROCEDURE REPORT  PATIENT: Phillip, [name]  MR#: [id number] BIRTHDATE: 18-Nov-1950 , 61  yrs. old GENDER: Male ENDOSCOPIST: Rachael Fee, MD PROCEDURE DATE:  06/16/2012 PROCEDURE:  EGD w/ biopsy ASA CLASS:     Class II INDICATIONS:  Barrett's short segment: EGD 2006 no dysplasia, EGD 2010 (jacobs) same, no dysplasia, recommended recall at 3 year interval.. MEDICATIONS: Fentanyl 50 mcg IV, Versed 6 mg IV, and These medications were titrated to patient response per physician's verbal order TOPICAL ANESTHETIC: Cetacaine Spray  DESCRIPTION OF PROCEDURE: After the risks benefits and alternatives of the procedure were thoroughly explained, informed consent was obtained.  The LB GIF-H180 [id number] endoscope was introduced through the mouth and advanced to the second portion of the duodenum. Without limitations.  The instrument was slowly withdrawn as the mucosa was fully examined.  There was moderat gastritis throughout stomach, biopsies taken distally and sent to pathology.  There was non-nodular 1.5cm tongue of Barrett's appearing esophagus above otherwise normal GE junction.  This was biopsied and sent to pathology.  The examination was otherwise normal.  Retroflexed views revealed no abnormalities.     The scope was then withdrawn from the patient and the procedure completed. COMPLICATIONS: There were no complications  ENDOSCOPIC IMPRESSION: There was moderate gastritis, biopsies taken  and sent to pathology to check for H. pylori. Non nodular, short segment of Barrett's was biopsied and sent to pathology. The examination was otherwise normal.  RECOMMENDATIONS: Await pathology results You have a few "tiny" gallstones in your gallbladder.  Please call if your right sided  or upper abdominal pains become more bothersome or if you are interested in discussing your pains with a surgeon.   eSigned:   Rachael Fee, MD 06/16/2012 10:37 AM   XB:[id number] Ward Chatters, MD

## 2012-06-16 NOTE — Patient Instructions (Signed)
Biopsies taken today. Resume current medications. Call us with any questions or concerns. Thank you!!  YOU HAD AN ENDOSCOPIC PROCEDURE TODAY AT THE [facility]: Refer to the procedure report that was given to you for any specific questions about what was found during the examination.  If the procedure report does not answer your questions, please call your gastroenterologist to clarify.  If you requested that your care partner not be given the details of your procedure findings, then the procedure report has been included in a sealed envelope for you to review at your convenience later.  YOU SHOULD EXPECT: Some feelings of bloating in the abdomen. Passage of more gas than usual.  Walking can help get rid of the air that was put into your GI tract during the procedure and reduce the bloating. If you had a lower endoscopy (such as a colonoscopy or flexible sigmoidoscopy) you may notice spotting of blood in your stool or on the toilet paper. If you underwent a bowel prep for your procedure, then you may not have a normal bowel movement for a few days.  DIET: Your first meal following the procedure should be a light meal and then it is ok to progress to your normal diet.  A half-sandwich or bowl of soup is an example of a good first meal.  Heavy or fried foods are harder to digest and may make you feel nauseous or bloated.  Likewise meals heavy in dairy and vegetables can cause extra gas to form and this can also increase the bloating.  Drink plenty of fluids but you should avoid alcoholic beverages for 24 hours.  ACTIVITY: Your care partner should take you home directly after the procedure.  You should plan to take it easy, moving slowly for the rest of the day.  You can resume normal activity the day after the procedure however you should NOT DRIVE or use heavy machinery for 24 hours (because of the sedation medicines used during the test).    SYMPTOMS TO REPORT IMMEDIATELY: A  gastroenterologist can be reached at any hour.  During normal business hours, 8:30 AM to 5:00 PM Monday through Friday, call [phone].  After hours and on weekends, please call the GI answering service at [phone] who will take a message and have the physician on call contact you.   Following upper endoscopy (EGD)  Vomiting of blood or coffee ground material  New chest pain or pain under the shoulder blades  Painful or persistently difficult swallowing  New shortness of breath  Fever of 100F or higher  Black, tarry-looking stools  FOLLOW UP: If any biopsies were taken you will be contacted by phone or by letter within the next 1-3 weeks.  Call your gastroenterologist if you have not heard about the biopsies in 3 weeks.  Our staff will call the home number listed on your records the next business day following your procedure to check on you and address any questions or concerns that you may have at that time regarding the information given to you following your procedure. This is a courtesy call and so if there is no answer at the home number and we have not heard from you through the emergency physician on call, we will assume that you have returned to your regular daily activities without incident.  SIGNATURES/CONFIDENTIALITY: You and/or your care partner have signed paperwork which will be entered into your electronic medical record.  These signatures attest to the fact that that the  information above on your After Visit Summary has been reviewed and is understood.  Full responsibility of the confidentiality of this discharge information lies with you and/or your care-partner.

## 2012-06-19 ENCOUNTER — Telehealth: Payer: Self-pay

## 2012-06-19 NOTE — Telephone Encounter (Signed)
Left message on answering machine. 

## 2012-06-21 ENCOUNTER — Encounter: Payer: Self-pay | Admitting: Gastroenterology

## 2012-08-24 ENCOUNTER — Encounter: Payer: Self-pay | Admitting: Family Medicine

## 2012-08-24 ENCOUNTER — Ambulatory Visit (INDEPENDENT_AMBULATORY_CARE_PROVIDER_SITE_OTHER): Payer: BC Managed Care – PPO | Admitting: Family Medicine

## 2012-08-24 VITALS — BP 130/80 | HR 68 | Temp 98.0°F | Ht 73.0 in | Wt 237.0 lb

## 2012-08-24 DIAGNOSIS — J0100 Acute maxillary sinusitis, unspecified: Secondary | ICD-10-CM

## 2012-08-24 MED ORDER — AMOXICILLIN 500 MG PO CAPS: 1000.0000 mg | ORAL_CAPSULE | Freq: Two times a day (BID) | ORAL | Status: DC

## 2012-08-24 NOTE — Progress Notes (Signed)
Nature conservation officer at [facility] [address] [place] [address] Phone: [phone] Fax: [phone]  Date:  08/24/2012   Name:  Phillip [name]   DOB:  01-25-1951   MRN:  [id number] Gender: male Age: 62 y.o.  PCP:  Hannah Beat, MD  Evaluating MD: Hannah Beat, MD   Chief Complaint: Sinusitis   History of Present Illness:  Phillip [name] is a 62 y.o. pleasant patient who presents with the following:  10 days, head congestion, now blowing out brown mixed in Some blood  Throat is sore Chest not really boterhing him.   Ears plugged some.   Sinus Pain: Patient complains of bilateral ear pressure/pain, congestion, cough described as nonproductive, facial pain, headache described as frontal, nasal congestion, post nasal drip, purulent nasal discharge and sinus pressure. Symptoms include above with no fever, chills, night sweats or weight loss. Onset of symptoms was 10 days ago, gradually worsening since that time. He is drinking plenty of fluids.  Past history is significant for occaisional sinus disease. Patient is non-smoker  Patient Active Problem List  Diagnosis  . ERECTILE DYSFUNCTION  . ALLERGIC RHINITIS  . GERD  . BARRETTS ESOPHAGUS  . COLONIC POLYPS, HX OF  . Thrombocytopenia    Past Medical History  Diagnosis Date  . Arthritis   . Allergic rhinitis   . Colon polyps     Severe dysplasia in a right-sided polyp removed surgically in [place], 2006; final pathology showed this was not a cancer; follow up colonoscopy at one year interval was normal  . GERD (gastroesophageal reflux disease)     H/o Barrett's esophagus, orginally diagnosed in 2006, biopsies showed no dysplasia; follow up EGD also showed no dysplasia  . Lyme disease     x2  . Prostate infection   . Erectile dysfunction   . History of BPH     Past Surgical History  Procedure Date  . Tonsillectomy     As a child  . Hernia repair 2008  . Tumor removal 2002    Fatty tumor  (neck)  . Lumbar disc surgery 1992    L4-5  . Varicose vein surgery 1997    Removal, right  . Hemicolectomy 2006    Right, for severely dysplastic right colon polyp  . Colonoscopy 12/2008    Repeat 5 years  . Esophagogastroduodenoscopy 12/2008    Barrett's, repeat 3 years    History  Substance Use Topics  . Smoking status: Never Smoker   . Smokeless tobacco: Never Used  . Alcohol Use: No    Family History  Problem Relation Age of Onset  . Hypertension Father   . Diabetes Father   . Lymphoma Mother   . Alcohol abuse Paternal Grandfather   . Heart disease Father     Allergies  Allergen Reactions  . Penicillins     REACTION: Rash as a child    Medication list has been reviewed and updated.  Outpatient Prescriptions Prior to Visit  Medication Sig Dispense Refill  . aspirin 81 MG tablet Take 81 mg by mouth daily.      . folic acid (FOLVITE) 1 MG tablet Take 1 mg by mouth daily.        . naproxen (NAPROSYN) 250 MG tablet Take 250 mg by mouth as needed.      Marland Kitchen omeprazole (PRILOSEC) 40 MG capsule Take 40 mg by mouth daily.        . saw palmetto 80 MG capsule Take 80  mg by mouth daily.         Last reviewed on 08/24/2012  2:32 PM by Hannah Beat, MD  Review of Systems:  ROS: GEN: Acute illness details above GI: Tolerating PO intake GU: maintaining adequate hydration and urination Pulm: No SOB Interactive and getting along well at home.  Otherwise, ROS is as per the HPI.   Physical Examination: BP 130/80  Pulse 68  Temp 98 F (36.7 C) (Oral)  Ht 6\' 1"  (1.854 m)  Wt 237 lb (107.502 kg)  BMI 31.27 kg/m2  SpO2 98%  Ideal Body Weight: Weight in (lb) to have BMI = 25: 189.1    Gen: WDWN, NAD; alert,appropriate and cooperative throughout exam  HEENT: Normocephalic and atraumatic. Throat clear, w/o exudate, no LAD, R TM clear, L TM - good landmarks, No fluid present. rhinnorhea.  Left frontal and maxillary sinuses: Tender, max Right frontal and maxillary  sinuses: Tender, max  Neck: No ant or post LAD CV: RRR, No M/G/R Pulm: Breathing comfortably in no resp distress. no w/c/r Abd: S,NT,ND,+BS Extr: no c/c/e Psych: full affect, pleasant   Assessment and Plan:  1. Sinusitis, acute maxillary    Acute sinusitis: ABX as below.  Reviewed symptomatic care as well as ABX in this case.    Orders Today:  No orders of the defined types were placed in this encounter.    Updated Medication List: (Includes new medications, updates to list, dose adjustments) Meds ordered this encounter  Medications  . amoxicillin (AMOXIL) 500 MG capsule    Sig: Take 2 capsules (1,000 mg total) by mouth 2 (two) times daily.    Dispense:  40 capsule    Refill:  0    Medications Discontinued: There are no discontinued medications.   Hannah Beat, MD

## 2012-09-18 ENCOUNTER — Ambulatory Visit (INDEPENDENT_AMBULATORY_CARE_PROVIDER_SITE_OTHER): Payer: BC Managed Care – PPO | Admitting: Family Medicine

## 2012-09-18 ENCOUNTER — Encounter: Payer: Self-pay | Admitting: Family Medicine

## 2012-09-18 VITALS — BP 118/80 | HR 66 | Temp 98.6°F | Wt 233.8 lb

## 2012-09-18 DIAGNOSIS — J209 Acute bronchitis, unspecified: Secondary | ICD-10-CM | POA: Insufficient documentation

## 2012-09-18 MED ORDER — AZITHROMYCIN 250 MG PO TABS: ORAL_TABLET | ORAL | Status: DC

## 2012-09-18 MED ORDER — GUAIFENESIN-CODEINE 100-10 MG/5ML PO SYRP: 5.0000 mL | ORAL_SOLUTION | Freq: Every evening | ORAL | Status: DC | PRN

## 2012-09-18 NOTE — Assessment & Plan Note (Signed)
Discussed clinical picture consistent with acute viral bronchitis after head cold. Supportive care as per instructions. cheratussin for cough. mucinex or IR guaifenesin. WASP script for azithromycin provided today in case not improving as expected or any worsening sxs. Pt agrees with plan.

## 2012-09-18 NOTE — Patient Instructions (Addendum)
You have developed bronchitis, but I think likely viral process currently. Antibiotics are not needed for this.  Viral infections usually take 7-10 days to resolve.  The cough can last a few weeks to go away. Use medication as prescribed: cheratussin for cough at night time Push fluids and plenty of rest. May use simple mucinex or immediate release guaifenesin with plenty of water to mobilize mucous out. Fill zpack if fever >101, or progressively worsening instead of improving daily. Call clinic with questions.  Good to see you today.

## 2012-09-18 NOTE — Progress Notes (Signed)
  Subjective:    Patient ID: Phillip [name], male    DOB: November 01, 1950, 62 y.o.   MRN: [id number]  HPI CC: cold  2wk h/o cold sxs, he thought the had the flu initially - in bed several days.  Felt better, then 4d ago deteriorated again.  In bed/at home all weekend.  + low grade fever, chest congestion.  Cough worse during day productive of brown mucous.  No body aches or PNdrainage.  2 wks ago started in head, now progressed to chest.  So far has tried nyquil.  No abd pain, n/v, diarrhea, ear or tooth pain.  [employer] - around a lot of ppl and hospitals recently. No smokers at home.  Never smoker. No h/o asthma.  Never been on inhalers.  Past Medical History  Diagnosis Date  . Arthritis   . Allergic rhinitis   . Colon polyps     Severe dysplasia in a right-sided polyp removed surgically in [place], 2006; final pathology showed this was not a cancer; follow up colonoscopy at one year interval was normal  . GERD (gastroesophageal reflux disease)     H/o Barrett's esophagus, orginally diagnosed in 2006, biopsies showed no dysplasia; follow up EGD also showed no dysplasia  . Lyme disease     x2  . Prostate infection   . Erectile dysfunction   . History of BPH       Review of Systems Per HPI    Objective:   Physical Exam  Nursing note and vitals reviewed. Constitutional: He appears well-developed and well-nourished. No distress.  HENT:  Head: Normocephalic and atraumatic.  Right Ear: Hearing, tympanic membrane, external ear and ear canal normal.  Left Ear: Hearing, tympanic membrane, external ear and ear canal normal.  Nose: Nose normal. No mucosal edema or rhinorrhea. Right sinus exhibits no maxillary sinus tenderness and no frontal sinus tenderness. Left sinus exhibits no maxillary sinus tenderness and no frontal sinus tenderness.  Mouth/Throat: Uvula is midline, oropharynx is clear and moist and mucous membranes are normal. No oropharyngeal exudate, posterior oropharyngeal  edema, posterior oropharyngeal erythema or tonsillar abscesses.  Eyes: Conjunctivae normal and EOM are normal. Pupils are equal, round, and reactive to light. No scleral icterus.  Neck: Normal range of motion. Neck supple.  Cardiovascular: Normal rate, regular rhythm, normal heart sounds and intact distal pulses.   No murmur heard. Pulmonary/Chest: Effort normal and breath sounds normal. No respiratory distress. He has no wheezes. He has no rales.       Initial LUL rhonchi that clear with deep breaths  Lymphadenopathy:    He has no cervical adenopathy.  Skin: Skin is warm and dry. No rash noted.      Assessment & Plan:

## 2013-01-26 ENCOUNTER — Other Ambulatory Visit: Payer: Self-pay | Admitting: Family Medicine

## 2013-02-05 ENCOUNTER — Other Ambulatory Visit (INDEPENDENT_AMBULATORY_CARE_PROVIDER_SITE_OTHER): Payer: BC Managed Care – PPO

## 2013-02-05 DIAGNOSIS — Z79899 Other long term (current) drug therapy: Secondary | ICD-10-CM

## 2013-02-05 DIAGNOSIS — Z13220 Encounter for screening for lipoid disorders: Secondary | ICD-10-CM

## 2013-02-05 DIAGNOSIS — Z125 Encounter for screening for malignant neoplasm of prostate: Secondary | ICD-10-CM

## 2013-02-05 LAB — BASIC METABOLIC PANEL
BUN: 14 mg/dL (ref 6–23)
Chloride: 105 mEq/L (ref 96–112)
Glucose, Bld: 118 mg/dL — ABNORMAL HIGH (ref 70–99)
Potassium: 4.1 mEq/L (ref 3.5–5.1)

## 2013-02-05 LAB — LIPID PANEL
Cholesterol: 128 mg/dL (ref 0–200)
LDL Cholesterol: 71 mg/dL (ref 0–99)
Total CHOL/HDL Ratio: 5

## 2013-02-05 LAB — CBC WITH DIFFERENTIAL/PLATELET
Basophils Absolute: 0 10*3/uL (ref 0.0–0.1)
Eosinophils Absolute: 0.1 10*3/uL (ref 0.0–0.7)
MCHC: 33.9 g/dL (ref 30.0–36.0)
MCV: 90.5 fl (ref 78.0–100.0)
Monocytes Absolute: 0.3 10*3/uL (ref 0.1–1.0)
Neutrophils Relative %: 54.8 % (ref 43.0–77.0)
Platelets: 116 10*3/uL — ABNORMAL LOW (ref 150.0–400.0)
WBC: 3.8 10*3/uL — ABNORMAL LOW (ref 4.5–10.5)

## 2013-02-05 LAB — HEPATIC FUNCTION PANEL
Bilirubin, Direct: 0.1 mg/dL (ref 0.0–0.3)
Total Bilirubin: 0.6 mg/dL (ref 0.3–1.2)

## 2013-02-08 ENCOUNTER — Ambulatory Visit (INDEPENDENT_AMBULATORY_CARE_PROVIDER_SITE_OTHER): Payer: BC Managed Care – PPO | Admitting: Family Medicine

## 2013-02-08 ENCOUNTER — Encounter: Payer: Self-pay | Admitting: Family Medicine

## 2013-02-08 VITALS — BP 120/84 | HR 64 | Temp 97.8°F | Ht 73.0 in | Wt 232.5 lb

## 2013-02-08 DIAGNOSIS — Z0000 Encounter for general adult medical examination without abnormal findings: Secondary | ICD-10-CM

## 2013-02-08 DIAGNOSIS — C4491 Basal cell carcinoma of skin, unspecified: Secondary | ICD-10-CM

## 2013-02-08 DIAGNOSIS — M19049 Primary osteoarthritis, unspecified hand: Secondary | ICD-10-CM

## 2013-02-08 MED ORDER — TRAMADOL HCL 50 MG PO TABS: 50.0000 mg | ORAL_TABLET | Freq: Four times a day (QID) | ORAL | Status: DC | PRN

## 2013-02-08 MED ORDER — MELOXICAM 7.5 MG PO TABS: 7.5000 mg | ORAL_TABLET | Freq: Every day | ORAL | Status: DC

## 2013-02-08 NOTE — Patient Instructions (Addendum)
F/u 10-14 days (not on a Monday), procedure appt with Dr. Patsy Lager (skin biopsy)

## 2013-02-08 NOTE — Progress Notes (Signed)
Nature conservation officer at [facility] [address] [place] [address] Phone: [phone] Fax: [phone]  Date:  02/08/2013   Name:  Phillip [name]   DOB:  [birth year]   MRN:  [id number] Gender: male Age: 62 y.o.  Primary Physician:  Hannah Beat, MD  Evaluating MD: Hannah Beat, MD   Chief Complaint: Annual Exam   History of Present Illness:  Phillip [name] is a 62 y.o. pleasant patient who presents with the following:  CPX:  Lower WBC and lower platelets.   Playing a lot of golf. Had a reoccuring chest infection. Stayed sick all winter.  Felt all the way in the chest through the spring.   Skin exam.   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 Will loosen up during the day. Backed off of alleve. Mobic will help, but rarely.   L4-5 back surgery in 1992. More numbness in great toe. And has flared up some. Some pain down the left leg.   R forearm, basal cell ca?  Preventative Health Maintenance Visit:  Health Maintenance Summary Reviewed and updated, unless pt declines services.  Tobacco History Reviewed. Alcohol: No concerns, no excessive use Exercise Habits: Some activity, limited by OA and back STD concerns: no risk or activity to increase risk Drug Use: None Encouraged self-testicular check  Health Maintenance  Topic Date Due  . Zostavax  01/04/2011  . Influenza Vaccine  04/16/2013  . Tetanus/tdap  12/17/2018  . Colonoscopy  02/21/2021    Labs reviewed with the patient.  Results for orders placed in visit on 02/05/13  LIPID PANEL      Result Value Range   Cholesterol 128  0 - 200 mg/dL   Triglycerides 865.7 (*) 0.0 - 149.0 mg/dL   HDL 84.69 (*) >62.95 mg/dL   VLDL 28.4  0.0 - 13.2 mg/dL   LDL Cholesterol 71  0 - 99 mg/dL   Total CHOL/HDL Ratio 5    CBC WITH DIFFERENTIAL      Result Value Range   WBC 3.8 (*) 4.5 - 10.5 K/uL   RBC 4.79  4.22 - 5.81 Mil/uL   Hemoglobin 14.7  13.0 - 17.0 g/dL   HCT 44.0  10.2 - 72.5 %   MCV 90.5  78.0 -  100.0 fl   MCHC 33.9  30.0 - 36.0 g/dL   RDW 36.6 (*) 44.0 - 34.7 %   Platelets 116.0 (*) 150.0 - 400.0 K/uL   Neutrophils Relative % 54.8  43.0 - 77.0 %   Lymphocytes Relative 34.6  12.0 - 46.0 %   Monocytes Relative 8.3  3.0 - 12.0 %   Eosinophils Relative 1.8  0.0 - 5.0 %   Basophils Relative 0.5  0.0 - 3.0 %   Neutro Abs 2.1  1.4 - 7.7 K/uL   Lymphs Abs 1.3  0.7 - 4.0 K/uL   Monocytes Absolute 0.3  0.1 - 1.0 K/uL   Eosinophils Absolute 0.1  0.0 - 0.7 K/uL   Basophils Absolute 0.0  0.0 - 0.1 K/uL  HEPATIC FUNCTION PANEL      Result Value Range   Total Bilirubin 0.6  0.3 - 1.2 mg/dL   Bilirubin, Direct 0.1  0.0 - 0.3 mg/dL   Alkaline Phosphatase 91  39 - 117 U/L   AST 24  0 - 37 U/L   ALT 18  0 - 53 U/L   Total Protein 6.9  6.0 - 8.3 g/dL   Albumin 3.9  3.5 - 5.2 g/dL  BASIC METABOLIC PANEL      Result Value Range   Sodium 138  135 - 145 mEq/L   Potassium 4.1  3.5 - 5.1 mEq/L   Chloride 105  96 - 112 mEq/L   CO2 29  19 - 32 mEq/L   Glucose, Bld 118 (*) 70 - 99 mg/dL   BUN 14  6 - 23 mg/dL   Creatinine, Ser 1.1  0.4 - 1.5 mg/dL   Calcium 8.7  8.4 - 19.1 mg/dL   GFR 47.82  >95.62 mL/min  PSA      Result Value Range   PSA 0.57  0.10 - 4.00 ng/mL     Patient Active Problem List   Diagnosis Date Noted  . Thrombocytopenia 04/19/2012  . BARRETTS ESOPHAGUS 12/16/2008  . ERECTILE DYSFUNCTION 09/26/2008  . ALLERGIC RHINITIS 09/26/2008  . GERD 09/26/2008  . COLONIC POLYPS, HX OF 09/26/2008    Past Medical History  Diagnosis Date  . Arthritis   . Allergic rhinitis   . Colon polyps     Severe dysplasia in a right-sided polyp removed surgically in [place], 2006; final pathology showed this was not a cancer; follow up colonoscopy at one year interval was normal  . GERD (gastroesophageal reflux disease)     H/o Barrett's esophagus, orginally diagnosed in 2006, biopsies showed no dysplasia; follow up EGD also showed no dysplasia  . Lyme disease     x2  . Prostate  infection   . Erectile dysfunction   . History of BPH     Past Surgical History  Procedure Laterality Date  . Tonsillectomy      As a child  . Hernia repair  2008  . 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 Lumbar disc surgery  1992    L4-5  . Varicose vein surgery  1997    Removal, right  . Hemicolectomy  2006    Right, for severely dysplastic right colon polyp  . Colonoscopy  12/2008    Repeat 5 years  . Esophagogastroduodenoscopy  12/2008    Barrett's, repeat 3 years    History   Social History  . Marital Status: Married    Spouse Name: N/A    Number of Children: 3  . Years of Education: N/A   Occupational History  . [employer], [employer]   .     Social History Main Topics  . Smoking status: Never Smoker   . Smokeless tobacco: Never Used  . Alcohol Use: No  . Drug Use: No  . Sexually Active: Not on file   Other Topics Concern  . Not on file   Social History Narrative   Married   Gets regular exercise    Family History  Problem Relation Age of Onset  . Hypertension Father   . Diabetes Father   . Lymphoma Mother   . Alcohol abuse Paternal Grandfather   . Heart disease Father     Allergies  Allergen Reactions  . Penicillins     REACTION: Rash as a child - Tolerated AMOXICILLIN SEVERAL TIMES AS ADULT RECENTLY    Medication list has been reviewed and updated.  Outpatient Prescriptions Prior to Visit  Medication Sig Dispense Refill  . aspirin 81 MG tablet Take 81 mg by mouth daily.      . folic acid (FOLVITE) 1 MG tablet Take 1 mg by mouth daily.        . naproxen (NAPROSYN) 250 MG tablet Take 250 mg  by mouth as needed.      Marland Kitchen omeprazole (PRILOSEC) 40 MG capsule Take 40 mg by mouth daily.        . saw palmetto 80 MG capsule Take 80 mg by mouth daily.         No facility-administered medications prior to visit.    Review of Systems:   General: Denies fever, chills, sweats. No significant weight loss. Eyes: Denies blurring,significant  itching ENT: Denies earache, sore throat, and hoarseness. Cardiovascular: Denies chest pains, palpitations, dyspnea on exertion Respiratory: Denies cough, dyspnea at rest,wheeezing Breast: no concerns about lumps GI: Denies nausea, vomiting, diarrhea, constipation, change in bowel habits, abdominal pain, melena, hematochezia GU: Denies penile discharge, ED, urinary flow / outflow problems. No STD concerns. Musculoskeletal: back and joint pains Derm: Denies rash, itching Neuro:numbness, l medial toe and foot. Psych: Denies depression, anxiety Endocrine: Denies cold intolerance, heat intolerance, polydipsia Heme: Denies enlarged lymph nodes Allergy: No hayfever   Physical Examination: BP 120/84  Pulse 64  Temp(Src) 97.8 F (36.6 C) (Oral)  Ht 6\' 1"  (1.854 m)  Wt 232 lb 8 oz (105.461 kg)  BMI 30.68 kg/m2  SpO2 99%  Ideal Body Weight: Weight in (lb) to have BMI = 25: 189.1   Wt Readings from Last 3 Encounters:  02/08/13 232 lb 8 oz (105.461 kg)  09/18/12 233 lb 12 oz (106.028 kg)  08/24/12 237 lb (107.502 kg)    GEN: well developed, well nourished, no acute distress Eyes: conjunctiva and lids normal, PERRLA, EOMI ENT: TM clear, nares clear, oral exam WNL Neck: supple, no lymphadenopathy, no thyromegaly, no JVD Pulm: clear to auscultation and percussion, respiratory effort normal CV: regular rate and rhythm, S1-S2, no murmur, rub or gallop, no bruits, peripheral pulses normal and symmetric, no cyanosis, clubbing, edema or varicosities Chest: no scars, masses GI: soft, non-tender; no hepatosplenomegaly, masses; active bowel sounds all quadrants GU: no hernia, testicular mass, penile discharge, or prostate enlargement Lymph: no cervical, axillary or inguinal adenopathy MSK: OA changes on hands. SKIN: 3 - 4 mm pinkish ulcerated lesion R forearm. Scattered scars, legs, buttocks. Neuro: normal mental status. L LE and L medial toe with decreased sensation. Psych: alert; oriented  to person, place and time, normally interactive and not anxious or depressed in appearance.   Assessment and Plan:  Routine general medical examination at a health care facility  Basal cell carcinoma  Arthritis of hand, degenerative, unspecified laterality  The patient's preventative maintenance and recommended screening tests for an annual wellness exam were reviewed in full today. Brought up to date unless services declined.  Counselled on the importance of diet, exercise, and its role in overall health and mortality. The patient's FH and SH was reviewed, including their home life, tobacco status, and drug and alcohol status.   Doing well overall  F/u for biopsy of R arm lesion  Orders Today:  No orders of the defined types were placed in this encounter.    Updated Medication List: (Includes new medications, updates to list, dose adjustments) Meds ordered this encounter  Medications  . traMADol (ULTRAM) 50 MG tablet    Sig: Take 1 tablet (50 mg total) by mouth every 6 (six) hours as needed for pain.    Dispense:  50 tablet    Refill:  2  . meloxicam (MOBIC) 7.5 MG tablet    Sig: Take 1 tablet (7.5 mg total) by mouth daily.    Dispense:  30 tablet    Refill:  5  Medications Discontinued: There are no discontinued medications.    Signed, Elpidio Galea. Francies Inch, MD 02/08/2013 8:53 AM

## 2013-02-23 ENCOUNTER — Ambulatory Visit (INDEPENDENT_AMBULATORY_CARE_PROVIDER_SITE_OTHER): Payer: BC Managed Care – PPO | Admitting: Family Medicine

## 2013-02-23 ENCOUNTER — Encounter: Payer: Self-pay | Admitting: Family Medicine

## 2013-02-23 VITALS — HR 90 | Temp 97.6°F | Ht 73.0 in | Wt 231.4 lb

## 2013-02-23 DIAGNOSIS — L989 Disorder of the skin and subcutaneous tissue, unspecified: Secondary | ICD-10-CM

## 2013-02-23 NOTE — Progress Notes (Signed)
SUBJECTIVE:  Phillip [name] is a 62 y.o. male who presents for lesion removal. We have already discussed this procedure, including option of not performing surgery, technique of surgery and potential for scarring at a recent visit.  OBJECTIVE:  Patient appears well. Vitals are normal. Skin: R forearm, 3 mm lesion mildly elevated, scale, some pearly appearance.  ASSESSMENT:  normal complete skin exam, no suspicious lesions, possible basal cell carcinoma pigment even, raised, pearly edges and ulcerated size 3 mm noted on the R forearm.  PLAN:  After informed consent was obtained, using Betadine for cleansing and 1% Lidocaine with epinephrine for anesthetic, with sterile technique, punch biopsy size 4 mm was performed. Antibiotic dressing is applied, and wound care instructions provided.  Be alert for any signs of cutaneous infection. The procedure was well tolerated without complications. Follow up: the patient may return prn. Sent to path.   Hannah Beat, MD 02/23/2013, 8:33 AM

## 2013-05-21 ENCOUNTER — Emergency Department (HOSPITAL_COMMUNITY): Payer: BC Managed Care – PPO

## 2013-05-21 ENCOUNTER — Encounter (HOSPITAL_COMMUNITY): Payer: Self-pay | Admitting: Emergency Medicine

## 2013-05-21 ENCOUNTER — Emergency Department (HOSPITAL_COMMUNITY)
Admission: EM | Admit: 2013-05-21 | Discharge: 2013-05-22 | Disposition: A | Discharge status: Home / Self Care | Payer: BC Managed Care – PPO | Attending: Emergency Medicine | Admitting: Emergency Medicine

## 2013-05-21 DIAGNOSIS — Z880 Allergy status to penicillin: Secondary | ICD-10-CM | POA: Insufficient documentation

## 2013-05-21 DIAGNOSIS — R5381 Other malaise: Secondary | ICD-10-CM | POA: Insufficient documentation

## 2013-05-21 DIAGNOSIS — J309 Allergic rhinitis, unspecified: Secondary | ICD-10-CM | POA: Insufficient documentation

## 2013-05-21 DIAGNOSIS — R112 Nausea with vomiting, unspecified: Secondary | ICD-10-CM | POA: Insufficient documentation

## 2013-05-21 DIAGNOSIS — Z860100 Personal history of colon polyps, unspecified: Secondary | ICD-10-CM | POA: Insufficient documentation

## 2013-05-21 DIAGNOSIS — R296 Repeated falls: Secondary | ICD-10-CM | POA: Insufficient documentation

## 2013-05-21 DIAGNOSIS — Z7982 Long term (current) use of aspirin: Secondary | ICD-10-CM | POA: Insufficient documentation

## 2013-05-21 DIAGNOSIS — R197 Diarrhea, unspecified: Secondary | ICD-10-CM | POA: Insufficient documentation

## 2013-05-21 DIAGNOSIS — K219 Gastro-esophageal reflux disease without esophagitis: Secondary | ICD-10-CM | POA: Insufficient documentation

## 2013-05-21 DIAGNOSIS — M6281 Muscle weakness (generalized): Secondary | ICD-10-CM | POA: Insufficient documentation

## 2013-05-21 DIAGNOSIS — Z79899 Other long term (current) drug therapy: Secondary | ICD-10-CM | POA: Insufficient documentation

## 2013-05-21 DIAGNOSIS — Y92009 Unspecified place in unspecified non-institutional (private) residence as the place of occurrence of the external cause: Secondary | ICD-10-CM | POA: Insufficient documentation

## 2013-05-21 DIAGNOSIS — R42 Dizziness and giddiness: Secondary | ICD-10-CM | POA: Insufficient documentation

## 2013-05-21 DIAGNOSIS — M129 Arthropathy, unspecified: Secondary | ICD-10-CM | POA: Insufficient documentation

## 2013-05-21 DIAGNOSIS — R109 Unspecified abdominal pain: Secondary | ICD-10-CM | POA: Insufficient documentation

## 2013-05-21 DIAGNOSIS — R55 Syncope and collapse: Secondary | ICD-10-CM | POA: Insufficient documentation

## 2013-05-21 DIAGNOSIS — S0003XA Contusion of scalp, initial encounter: Secondary | ICD-10-CM | POA: Insufficient documentation

## 2013-05-21 DIAGNOSIS — E860 Dehydration: Secondary | ICD-10-CM

## 2013-05-21 DIAGNOSIS — Z8619 Personal history of other infectious and parasitic diseases: Secondary | ICD-10-CM | POA: Insufficient documentation

## 2013-05-21 DIAGNOSIS — Z87898 Personal history of other specified conditions: Secondary | ICD-10-CM | POA: Insufficient documentation

## 2013-05-21 DIAGNOSIS — Y9389 Activity, other specified: Secondary | ICD-10-CM | POA: Insufficient documentation

## 2013-05-21 DIAGNOSIS — N529 Male erectile dysfunction, unspecified: Secondary | ICD-10-CM | POA: Insufficient documentation

## 2013-05-21 DIAGNOSIS — S0990XA Unspecified injury of head, initial encounter: Secondary | ICD-10-CM | POA: Insufficient documentation

## 2013-05-21 LAB — CBC WITH DIFFERENTIAL/PLATELET
Eosinophils Absolute: 0 10*3/uL (ref 0.0–0.7)
Eosinophils Relative: 0 % (ref 0–5)
Hemoglobin: 17.2 g/dL — ABNORMAL HIGH (ref 13.0–17.0)
Lymphs Abs: 1 10*3/uL (ref 0.7–4.0)
MCH: 28.8 pg (ref 26.0–34.0)
MCV: 85.5 fL (ref 78.0–100.0)
Monocytes Absolute: 0.5 10*3/uL (ref 0.1–1.0)
Monocytes Relative: 6 % (ref 3–12)
Neutro Abs: 8.3 10*3/uL — ABNORMAL HIGH (ref 1.7–7.7)
Platelets: 165 10*3/uL (ref 150–400)
RBC: 5.98 MIL/uL — ABNORMAL HIGH (ref 4.22–5.81)

## 2013-05-21 LAB — COMPREHENSIVE METABOLIC PANEL
BUN: 20 mg/dL (ref 6–23)
Calcium: 10.2 mg/dL (ref 8.4–10.5)
Creatinine, Ser: 1.57 mg/dL — ABNORMAL HIGH (ref 0.50–1.35)
GFR calc Af Amer: 53 mL/min — ABNORMAL LOW (ref >90)
Glucose, Bld: 142 mg/dL — ABNORMAL HIGH (ref 70–99)
Total Protein: 9 g/dL — ABNORMAL HIGH (ref 6.0–8.3)

## 2013-05-21 MED ORDER — ONDANSETRON HCL 4 MG/2ML IJ SOLN
4.0000 mg | Freq: Once | INTRAMUSCULAR | Status: AC
  Administered 2013-05-21: 4 mg via INTRAVENOUS
  Filled 2013-05-21: qty 2

## 2013-05-21 MED ORDER — SODIUM CHLORIDE 0.9 % IV BOLUS (SEPSIS)
1000.0000 mL | Freq: Once | INTRAVENOUS | Status: AC
  Administered 2013-05-21: 1000 mL via INTRAVENOUS

## 2013-05-21 MED ORDER — SODIUM CHLORIDE 0.9 % IV BOLUS (SEPSIS)
1000.0000 mL | Freq: Once | INTRAVENOUS | Status: AC
  Administered 2013-05-22: 1000 mL via INTRAVENOUS

## 2013-05-21 MED ORDER — LOPERAMIDE HCL 2 MG PO CAPS
4.0000 mg | ORAL_CAPSULE | Freq: Once | ORAL | Status: AC
  Administered 2013-05-21: 4 mg via ORAL
  Filled 2013-05-21: qty 2

## 2013-05-21 NOTE — ED Notes (Signed)
Pt. reports nausea , vomitting , diarrhea and 2 syncopal episode at home this evening , presents with abrasion at upper forehead , alert and oriented at arrival , lightheaded and generalized weakness.

## 2013-05-21 NOTE — ED Provider Notes (Signed)
CSN: [id number]     Arrival date & time 05/21/13  1920 History   First MD Initiated Contact with Patient 05/21/13 2301     Chief Complaint  Patient presents with  . Loss of Consciousness  . Emesis  . Diarrhea   (Consider location/radiation/quality/duration/timing/severity/associated sxs/prior Treatment) HPI Patient presents with multiple episodes of diarrhea that started roughly 24 hours ago. The diarrhea is watery consistency and he states he's probably had greater than 20 episodes. He has episodic abdominal cramping that is mildly relieved after having a bowel movement. He's had no recent hospitalizations or antibiotic use. Patient also had 3 episodes of vomiting after the diarrhea started. He states he still feels mildly nauseated. This afternoon when trying to stand up from a lying position he got lightheaded and found himself on the floor. He thinks he had a brief loss of consciousness. This happened 2 times. He has a mild contusion to his left forehead where he hit an unknown object. He has just very mild pain at the site. He denies any posterior cervical spine pain. He denies any focal weakness or numbness. He just complains of generalized fatigue and generalized weakness. No fevers or chills. There has been no mucous or blood in the vomit or diarrhea. States he ate at a church cookout on Saturday night but does not know of anyone else getting sick from the same food. Denies chest pain or shortness of breath. Past Medical History  Diagnosis Date  . Arthritis   . Allergic rhinitis   . Colon polyps     Severe dysplasia in a right-sided polyp removed surgically in [place], 2006; final pathology showed this was not a cancer; follow up colonoscopy at one year interval was normal  . GERD (gastroesophageal reflux disease)     H/o Barrett's esophagus, orginally diagnosed in 2006, biopsies showed no dysplasia; follow up EGD also showed no dysplasia  . Lyme disease     x2  . Prostate infection    . Erectile dysfunction   . History of BPH    Past Surgical History  Procedure Laterality Date  . Tonsillectomy      As a child  . Hernia repair  2008  . 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 Lumbar disc surgery  1992    L4-5  . Varicose vein surgery  1997    Removal, right  . Hemicolectomy  2006    Right, for severely dysplastic right colon polyp  . Colonoscopy  12/2008    Repeat 5 years  . Esophagogastroduodenoscopy  12/2008    Barrett's, repeat 3 years   Family History  Problem Relation Age of Onset  . Hypertension Father   . Diabetes Father   . Lymphoma Mother   . Alcohol abuse Paternal Grandfather   . Heart disease Father    History  Substance Use Topics  . Smoking status: Never Smoker   . Smokeless tobacco: Never Used  . Alcohol Use: No    Review of Systems  Constitutional: Positive for fatigue. Negative for fever and chills.  HENT: Negative for neck pain and neck stiffness.   Eyes: Negative for visual disturbance.  Respiratory: Negative for shortness of breath.   Cardiovascular: Negative for chest pain and palpitations.  Gastrointestinal: Positive for nausea, vomiting, abdominal pain and diarrhea. Negative for constipation.  Genitourinary: Negative for dysuria, frequency and hematuria.  Musculoskeletal: Negative for myalgias and back pain.  Skin: Negative for rash and wound.  Neurological: Positive  for dizziness, syncope, light-headedness and headaches. Negative for weakness and numbness.  All other systems reviewed and are negative.    Allergies  Penicillins  Home Medications   Current Outpatient Rx  Name  Route  Sig  Dispense  Refill  . aspirin 81 MG tablet   Oral   Take 81 mg by mouth daily.         . folic acid (FOLVITE) 1 MG tablet   Oral   Take 1 mg by mouth daily.           . meloxicam (MOBIC) 7.5 MG tablet   Oral   Take 1 tablet (7.5 mg total) by mouth daily.   30 tablet   5   . naproxen (NAPROSYN) 250 MG tablet    Oral   Take 250 mg by mouth as needed.         Marland Kitchen omeprazole (PRILOSEC) 40 MG capsule   Oral   Take 40 mg by mouth daily.           . saw palmetto 80 MG capsule   Oral   Take 80 mg by mouth daily.           . traMADol (ULTRAM) 50 MG tablet   Oral   Take 1 tablet (50 mg total) by mouth every 6 (six) hours as needed for pain.   50 tablet   2    BP 110/79  Pulse 102  Temp(Src) 98.9 F (37.2 C) (Oral)  Resp 14  Ht 6\' 1"  (1.854 m)  Wt 221 lb (100.245 kg)  BMI 29.16 kg/m2  SpO2 98% Physical Exam  Nursing note and vitals reviewed. Constitutional: He is oriented to person, place, and time. He appears well-developed and well-nourished. No distress.  HENT:  Head: Normocephalic.  Small left frontal contusion. No underlying bony deformity. Bilateral TMs are normal. Dry mucous membranes  Eyes: EOM are normal. Pupils are equal, round, and reactive to light.  Neck: Normal range of motion. Neck supple.  The posterior cervical spine tenderness.  Cardiovascular: Normal rate and regular rhythm.   Pulmonary/Chest: Effort normal and breath sounds normal. No respiratory distress. He has no wheezes. He has no rales.  Abdominal: Soft. Bowel sounds are normal. He exhibits no distension and no mass. There is no tenderness (no focal abdominal tenderness to palpation.). There is no rebound and no guarding.  Musculoskeletal: Normal range of motion. He exhibits no edema and no tenderness.  No calf swelling or tenderness.  Neurological: He is alert and oriented to person, place, and time.  Patient is alert and oriented x3 with clear, goal oriented speech. Patient has 5/5 motor in all extremities. Sensation is intact to light touch. Bilateral finger-to-nose is normal with no signs of dysmetria.    Skin: Skin is warm and dry. No rash noted. No erythema.  Psychiatric: He has a normal mood and affect. His behavior is normal.    ED Course  Procedures (including critical care time) Labs  Review Labs Reviewed  CBC WITH DIFFERENTIAL - Abnormal; Notable for the following:    RBC 5.98 (*)    Hemoglobin 17.2 (*)    Neutrophils Relative % 84 (*)    Neutro Abs 8.3 (*)    Lymphocytes Relative 10 (*)    All other components within normal limits  COMPREHENSIVE METABOLIC PANEL - Abnormal; Notable for the following:    Glucose, Bld 142 (*)    Creatinine, Ser 1.57 (*)    Total Protein 9.0 (*)  Alkaline Phosphatase 129 (*)    GFR calc non Af Amer 46 (*)    GFR calc Af Amer 53 (*)    All other components within normal limits  URINALYSIS, ROUTINE W REFLEX MICROSCOPIC   Imaging Review No results found.   Date: 05/21/2013  Rate: 98  Rhythm: normal sinus rhythm  QRS Axis: normal  Intervals: normal  ST/T Wave abnormalities: normal  Conduction Disutrbances:none  Narrative Interpretation:   Old EKG Reviewed: none available   MDM   Suspect patient has a gastroenteritis versus food poisoning. Symptoms appear to be caused by dehydration. Both clinically and by labs patient is dehydrated. Abdominal exam and neurologic exam is normal. Will treat symptoms and rehydrate. Given the patient has had multiple episodes of syncope and mild head trauma will get CT of the head.  Patient says he is feeling much better after 2 L of IV fluid. His heart rate has come down from over 100 to being in the 60s currently. His blood pressure has come up to a systolic greater than 120. He is ambulated to the bathroom and states he did not feel dizzy. His abdominal cramping has improved. He states the diarrhea has improved. Patient has been advised to return immediately to the emergency department for worsening abdominal pain, fever, lightheadedness or syncope. She is advised to followup with his primary MD. He has being given head injury precautions.  Loren Racer, MD 05/22/13 [phone]

## 2013-05-22 ENCOUNTER — Telehealth: Payer: Self-pay | Admitting: *Deleted

## 2013-05-22 LAB — URINALYSIS, ROUTINE W REFLEX MICROSCOPIC
Hgb urine dipstick: NEGATIVE
Leukocytes, UA: NEGATIVE
Specific Gravity, Urine: 1.03 (ref 1.005–1.030)
Urobilinogen, UA: 0.2 mg/dL (ref 0.0–1.0)

## 2013-05-22 LAB — URINE MICROSCOPIC-ADD ON

## 2013-05-22 MED ORDER — DICYCLOMINE HCL 20 MG PO TABS: 20.0000 mg | ORAL_TABLET | Freq: Two times a day (BID) | ORAL | Status: DC | PRN

## 2013-05-22 MED ORDER — ONDANSETRON HCL 4 MG/2ML IJ SOLN
4.0000 mg | Freq: Once | INTRAMUSCULAR | Status: AC
  Administered 2013-05-22: 4 mg via INTRAVENOUS
  Filled 2013-05-22: qty 2

## 2013-05-22 MED ORDER — ONDANSETRON 4 MG PO TBDP: ORAL_TABLET | ORAL | Status: DC

## 2013-05-22 NOTE — Telephone Encounter (Signed)
We have received ED report ( in epic) I reviewed and Dr. Patsy Lager will as well when he returns.  At this point pushing fluids is most important.  Diarrhea from viral infection or food poisening can take a few days to resolve.  I would recommend follow up  if not improving toward the end of this week.

## 2013-05-22 NOTE — Telephone Encounter (Signed)
FYI

## 2013-05-22 NOTE — Telephone Encounter (Signed)
Phillip [name] left voicemail stating Phillip [name] was seen in the ER on 05/21/2013 for severe dehydration and diarrhea.  Was given two bags of fluids. Wanting to know if we have gotten the report from the ER.  Still having diarrhea.  Asking if there is anything else she can do?  Will forward to Dr. Patsy Lager for review.

## 2013-05-22 NOTE — Telephone Encounter (Signed)
Wife advised. 

## 2013-05-22 NOTE — ED Notes (Signed)
Pt updated that a urine sample is needed.

## 2013-06-21 ENCOUNTER — Other Ambulatory Visit: Payer: Self-pay

## 2013-12-15 ENCOUNTER — Encounter: Payer: Self-pay | Admitting: Gastroenterology

## 2014-03-22 ENCOUNTER — Encounter: Payer: Self-pay | Admitting: Gastroenterology

## 2014-05-23 ENCOUNTER — Encounter: Payer: Self-pay | Admitting: Gastroenterology

## 2014-05-30 ENCOUNTER — Ambulatory Visit (INDEPENDENT_AMBULATORY_CARE_PROVIDER_SITE_OTHER): Payer: BC Managed Care – PPO

## 2014-05-30 DIAGNOSIS — Z23 Encounter for immunization: Secondary | ICD-10-CM

## 2014-07-09 ENCOUNTER — Ambulatory Visit (AMBULATORY_SURGERY_CENTER): Payer: Self-pay

## 2014-07-09 VITALS — Ht 73.0 in | Wt 223.6 lb

## 2014-07-09 DIAGNOSIS — Z86010 Personal history of colonic polyps: Secondary | ICD-10-CM

## 2014-07-09 MED ORDER — MOVIPREP 100 G PO SOLR: ORAL | Status: DC

## 2014-07-09 NOTE — Progress Notes (Signed)
Per pt, no allergies to soy or egg products.Pt not taking any weight loss meds or using  O2 at home. 

## 2014-07-23 ENCOUNTER — Ambulatory Visit (AMBULATORY_SURGERY_CENTER): Payer: BC Managed Care – PPO | Admitting: Gastroenterology

## 2014-07-23 ENCOUNTER — Encounter: Payer: Self-pay | Admitting: Gastroenterology

## 2014-07-23 VITALS — BP 118/70 | HR 55 | Temp 96.9°F | Resp 14 | Ht 73.0 in | Wt 223.0 lb

## 2014-07-23 DIAGNOSIS — Z86010 Personal history of colonic polyps: Secondary | ICD-10-CM

## 2014-07-23 DIAGNOSIS — K639 Disease of intestine, unspecified: Secondary | ICD-10-CM

## 2014-07-23 MED ORDER — SODIUM CHLORIDE 0.9 % IV SOLN: 500.0000 mL | INTRAVENOUS | Status: DC

## 2014-07-23 NOTE — Progress Notes (Signed)
Called to room to assist during endoscopic procedure.  Patient ID and intended procedure confirmed with present staff. Received instructions for my participation in the procedure from the performing physician.  

## 2014-07-23 NOTE — Progress Notes (Signed)
Procedure ends, to recovery, report given and VSS. 

## 2014-07-23 NOTE — Patient Instructions (Signed)
YOU HAD AN ENDOSCOPIC PROCEDURE TODAY AT THE [facility]: Refer to the procedure report that was given to you for any specific questions about what was found during the examination.  If the procedure report does not answer your questions, please call your gastroenterologist to clarify.  If you requested that your care partner not be given the details of your procedure findings, then the procedure report has been included in a sealed envelope for you to review at your convenience later.  YOU SHOULD EXPECT: Some feelings of bloating in the abdomen. Passage of more gas than usual.  Walking can help get rid of the air that was put into your GI tract during the procedure and reduce the bloating. If you had a lower endoscopy (such as a colonoscopy or flexible sigmoidoscopy) you may notice spotting of blood in your stool or on the toilet paper. If you underwent a bowel prep for your procedure, then you may not have a normal bowel movement for a few days.  DIET: Your first meal following the procedure should be a light meal and then it is ok to progress to your normal diet.  A half-sandwich or bowl of soup is an example of a good first meal.  Heavy or fried foods are harder to digest and may make you feel nauseous or bloated.  Likewise meals heavy in dairy and vegetables can cause extra gas to form and this can also increase the bloating.  Drink plenty of fluids but you should avoid alcoholic beverages for 24 hours.  ACTIVITY: Your care partner should take you home directly after the procedure.  You should plan to take it easy, moving slowly for the rest of the day.  You can resume normal activity the day after the procedure however you should NOT DRIVE or use heavy machinery for 24 hours (because of the sedation medicines used during the test).    SYMPTOMS TO REPORT IMMEDIATELY: A gastroenterologist can be reached at any hour.  During normal business hours, 8:30 AM to 5:00 PM Monday through Friday,  call [phone].  After hours and on weekends, please call the GI answering service at [phone] who will take a message and have the physician on call contact you.   Following lower endoscopy (colonoscopy or flexible sigmoidoscopy):  Excessive amounts of blood in the stool  Significant tenderness or worsening of abdominal pains  Swelling of the abdomen that is new, acute  Fever of 100F or higher  FOLLOW UP: If any biopsies were taken you will be contacted by phone or by letter within the next 1-3 weeks.  Call your gastroenterologist if you have not heard about the biopsies in 3 weeks.  Our staff will call the home number listed on your records the next business day following your procedure to check on you and address any questions or concerns that you may have at that time regarding the information given to you following your procedure. This is a courtesy call and so if there is no answer at the home number and we have not heard from you through the emergency physician on call, we will assume that you have returned to your regular daily activities without incident.  SIGNATURES/CONFIDENTIALITY: You and/or your care partner have signed paperwork which will be entered into your electronic medical record.  These signatures attest to the fact that that the information above on your After Visit Summary has been reviewed and is understood.  Full responsibility of the confidentiality of this   discharge information lies with you and/or your care-partner.  Recommendations Await pathology report.

## 2014-07-24 ENCOUNTER — Telehealth: Payer: Self-pay

## 2014-07-24 NOTE — Telephone Encounter (Signed)
  Follow up Call-  Call back number 07/23/2014 06/16/2012  Post procedure Call Back phone  # [phone]  Permission to leave phone message Yes Yes     Patient questions:  Do you have a fever, pain , or abdominal swelling? No. Pain Score  0 *  Have you tolerated food without any problems? Yes.    Have you been able to return to your normal activities? Yes.    Do you have any questions about your discharge instructions: Diet   No. Medications  No. Follow up visit  No.  Do you have questions or concerns about your Care? No.  Actions: * If pain score is 4 or above: No action needed, pain <4.  No problems per the pt. maw

## 2014-07-24 NOTE — Op Note (Signed)
[facility]  [address] [place], [id number]   COLONOSCOPY PROCEDURE REPORT  PATIENT: Phillip [name], Phillip [name]  MR#: [id number] BIRTHDATE: 03-22-1951 , 83  yrs. old GENDER: male ENDOSCOPIST: Milus Banister, MD PROCEDURE DATE:  07/23/2014 PROCEDURE:   Colonoscopy with biopsy and Submucosal injection, any substance First Screening Colonoscopy - Avg.  risk and is 50 yrs.  old or older - No.  Prior Negative Screening - Now for repeat screening. N/A  History of Adenoma - Now for follow-up colonoscopy & has been > or = to 3 yrs.  Yes hx of adenoma.  Has been 3 or more years since last colonoscopy.  Polyps Removed Today? No.  Recommend repeat exam, <10 yrs? Yes.  High risk (family or personal hx). ASA CLASS:   Class II INDICATIONS:Colonoscopy in 2006, a large right sided TVA with severe dysplasia was found.  this was too large to be removed endoscopically and so he underwent a right hemicolectomy.  Surgical pathology confirmed this was not a cancer, 18 nodes were sampled and all were negative.  Underwent second colonoscopy in 2007, clear anastomosis.  Repeat colonoscopy [name] 12/2008 found right hemicolectomy anastomosis, o/w normal.  Recall recommended at 5 year interval. MEDICATIONS: Monitored anesthesia care and Propofol 260 mg IV  DESCRIPTION OF PROCEDURE:   After the risks benefits and alternatives of the procedure were thoroughly explained, informed consent was obtained.  The digital rectal exam revealed no abnormalities of the rectum.   The [id number]  endoscope was introduced through the anus and advanced to the cecum, which was identified by both the appendix and ileocecal valve. No adverse events experienced.   The quality of the prep was excellent.  The instrument was then slowly withdrawn as the colon was fully examined.   COLON FINDINGS: The right hemicolectomy anastomosis was normal appearing.  There was an area in sigmoid colon (20cm from  anus) with irregular appearing mucosa along about 1/3 the lumen circumference.  This was NOT clearly neoplastic appearing and may just represent mild scope trauma however given his previous high grade dysplasia in polyp 9 years ago I biopsied the site extensively and then labeled the site with submucosal injection of Spot.  The examination was othewise normal.  Retroflexed views revealed no abnormalities. The time to cecum= NA.  Withdrawal time= NA.  The scope was withdrawn and the procedure completed. COMPLICATIONS: There were no immediate complications.  ENDOSCOPIC IMPRESSION: The right hemicolectomy anastomosis was normal appearing.  There was an area in sigmoid colon (20cm from anus) with irregular appearing mucosa along about 1/3 the lumen circumference.  This was NOT clearly neoplastic appearing and may just represent mild scope trauma however given his previous high grade dysplasia in polyp 9 years ago I biopsied the site extensively and then labeled the site with submucosal injection of Spot.  The examination was othewise normal  RECOMMENDATIONS: Await biopsy results, you will likely need repeat colonoscopy in 5 years.  eSigned:  Milus Banister, MD 07/23/2014 10:27 AM   cc: Owens Loffler, MD

## 2014-07-30 ENCOUNTER — Encounter: Payer: Self-pay | Admitting: Gastroenterology

## 2015-06-20 ENCOUNTER — Encounter: Payer: Self-pay | Admitting: Gastroenterology

## 2015-07-21 ENCOUNTER — Other Ambulatory Visit: Payer: Self-pay | Admitting: Family Medicine

## 2015-07-21 ENCOUNTER — Other Ambulatory Visit (INDEPENDENT_AMBULATORY_CARE_PROVIDER_SITE_OTHER): Payer: BLUE CROSS/BLUE SHIELD

## 2015-07-21 DIAGNOSIS — N289 Disorder of kidney and ureter, unspecified: Secondary | ICD-10-CM

## 2015-07-21 DIAGNOSIS — E785 Hyperlipidemia, unspecified: Secondary | ICD-10-CM

## 2015-07-21 DIAGNOSIS — Z125 Encounter for screening for malignant neoplasm of prostate: Secondary | ICD-10-CM

## 2015-07-21 DIAGNOSIS — D696 Thrombocytopenia, unspecified: Secondary | ICD-10-CM

## 2015-07-21 LAB — COMPREHENSIVE METABOLIC PANEL
ALT: 15 U/L (ref 0–53)
AST: 16 U/L (ref 0–37)
Albumin: 3.8 g/dL (ref 3.5–5.2)
Alkaline Phosphatase: 89 U/L (ref 39–117)
BUN: 12 mg/dL (ref 6–23)
CO2: 28 meq/L (ref 19–32)
Calcium: 8.8 mg/dL (ref 8.4–10.5)
Chloride: 106 mEq/L (ref 96–112)
Creatinine, Ser: 1.21 mg/dL (ref 0.40–1.50)
GFR: 64.06 mL/min (ref >60.00)
GLUCOSE: 103 mg/dL — AB (ref 70–99)
POTASSIUM: 4.3 meq/L (ref 3.5–5.1)
Sodium: 141 mEq/L (ref 135–145)
Total Bilirubin: 0.6 mg/dL (ref 0.2–1.2)
Total Protein: 6.6 g/dL (ref 6.0–8.3)

## 2015-07-21 LAB — LIPID PANEL
CHOL/HDL RATIO: 4
Cholesterol: 105 mg/dL (ref 0–200)
HDL: 25.1 mg/dL — ABNORMAL LOW (ref >39.00)
LDL Cholesterol: 56 mg/dL (ref 0–99)
NONHDL: 79.87
Triglycerides: 121 mg/dL (ref 0.0–149.0)
VLDL: 24.2 mg/dL (ref 0.0–40.0)

## 2015-07-21 LAB — CBC WITH DIFFERENTIAL/PLATELET
BASOS PCT: 0 % (ref 0.0–3.0)
Basophils Absolute: 0 10*3/uL (ref 0.0–0.1)
EOS PCT: 1.7 % (ref 0.0–5.0)
Eosinophils Absolute: 0.1 10*3/uL (ref 0.0–0.7)
HEMATOCRIT: 42.3 % (ref 39.0–52.0)
Hemoglobin: 13.5 g/dL (ref 13.0–17.0)
LYMPHS ABS: 1.4 10*3/uL (ref 0.7–4.0)
LYMPHS PCT: 37.9 % (ref 12.0–46.0)
MCHC: 32 g/dL (ref 30.0–36.0)
MCV: 82.8 fl (ref 78.0–100.0)
MONOS PCT: 8.9 % (ref 3.0–12.0)
Monocytes Absolute: 0.3 10*3/uL (ref 0.1–1.0)
NEUTROS ABS: 2 10*3/uL (ref 1.4–7.7)
NEUTROS PCT: 51.5 % (ref 43.0–77.0)
PLATELETS: 126 10*3/uL — AB (ref 150.0–400.0)
RBC: 5.11 Mil/uL (ref 4.22–5.81)
RDW: 17.5 % — AB (ref 11.5–15.5)
WBC: 3.8 10*3/uL — ABNORMAL LOW (ref 4.0–10.5)

## 2015-07-21 LAB — VITAMIN D 25 HYDROXY (VIT D DEFICIENCY, FRACTURES): VITD: 31.08 ng/mL (ref 30.00–100.00)

## 2015-07-21 LAB — PSA: PSA: 0.57 ng/mL (ref 0.10–4.00)

## 2015-07-28 ENCOUNTER — Encounter: Payer: Self-pay | Admitting: Family Medicine

## 2015-07-28 ENCOUNTER — Ambulatory Visit (INDEPENDENT_AMBULATORY_CARE_PROVIDER_SITE_OTHER): Payer: BLUE CROSS/BLUE SHIELD | Admitting: Family Medicine

## 2015-07-28 ENCOUNTER — Telehealth: Payer: Self-pay

## 2015-07-28 VITALS — BP 128/72 | HR 75 | Temp 98.3°F | Ht 73.0 in | Wt 233.5 lb

## 2015-07-28 DIAGNOSIS — Z0000 Encounter for general adult medical examination without abnormal findings: Secondary | ICD-10-CM

## 2015-07-28 MED ORDER — MELOXICAM 7.5 MG PO TABS: 7.5000 mg | ORAL_TABLET | Freq: Every day | ORAL | Status: DC | PRN

## 2015-07-28 MED ORDER — MELOXICAM 7.5 MG PO TABS: 7.5000 mg | ORAL_TABLET | ORAL | Status: DC | PRN

## 2015-07-28 NOTE — Progress Notes (Signed)
Dr. Frederico Hamman T. Ariahna Smiddy, MD, [facility] [place], [address] Phone: [phone] Fax: [phone]  07/28/2015  Patient: Phillip [name], MRN: [id number], DOB: 1951/04/28, 64 y.o.  Primary Physician:  Owens Loffler, MD   Chief Complaint  Patient presents with  . Annual Exam   Subjective:   Phillip [name] is a 64 y.o. pleasant patient who presents with the following:  Preventative Health Maintenance Visit:  Health Maintenance Summary Reviewed and updated, unless pt declines services.  Tobacco History Reviewed. Alcohol: No concerns, no excessive use Exercise Habits: Some activity, rec at least 30 mins 5 times a week STD concerns: no risk or activity to increase risk Drug Use: None Encouraged self-testicular check  Got shingles vaccine from pharmacy  Health Maintenance  Topic Date Due  . Hepatitis C Screening  June 09, 1951  . HIV Screening  01/03/1966  . ZOSTAVAX  01/04/2011  . INFLUENZA VACCINE  03/16/2016  . TETANUS/TDAP  12/17/2018  . COLONOSCOPY  07/24/2019   Immunization History  Administered Date(s) Administered  . H1N1 09/26/2008  . Influenza Whole 09/26/2008  . Influenza,inj,Quad PF,36+ Mos 05/30/2014, 06/07/2015  . Pneumococcal Polysaccharide-23 08/17/2007  . Td 12/16/2008   Patient Active Problem List   Diagnosis Date Noted  . Renal insufficiency 07/21/2015  . Thrombocytopenia ([place]) 04/19/2012  . BARRETTS ESOPHAGUS 12/16/2008  . ERECTILE DYSFUNCTION 09/26/2008  . ALLERGIC RHINITIS 09/26/2008  . GERD 09/26/2008  . COLONIC POLYPS, HX OF 09/26/2008   Past Medical History  Diagnosis Date  . Arthritis   . Allergic rhinitis   . Colon polyps     Severe dysplasia in a right-sided polyp removed surgically in [place], 2006; final pathology showed this was not a cancer; follow up colonoscopy at one year interval was normal  . GERD (gastroesophageal reflux disease)     H/o Barrett's  esophagus, orginally diagnosed in 2006, biopsies showed no dysplasia; follow up EGD also showed no dysplasia  . Lyme disease     x2  . Prostate infection   . Erectile dysfunction   . History of BPH    Past Surgical History  Procedure Laterality Date  . Tonsillectomy      As a child  . Hernia repair  1017    UMBILICAL  . 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 Lumbar disc surgery  1992    L4-5  . Varicose vein surgery  1997    Removal, right  . Hemicolectomy  2006    Right, for severely dysplastic right colon polyp  . Colonoscopy  12/2008    Repeat 5 years  . Esophagogastroduodenoscopy  12/2008    Barrett's, repeat 3 years  . Upper gastrointestinal endoscopy     Social History   Social History  . Marital Status: Married    Spouse Name: N/A  . Number of Children: 3  . Years of Education: N/A   Occupational History  . [employer], [employer] History Main Topics  . Smoking status: Never Smoker   . Smokeless tobacco: Never Used  . Alcohol Use: No  . Drug Use: No  . Sexual Activity: Not on file   Other Topics Concern  . Not on file   Social History Narrative   Married   Gets regular exercise   Family History  Problem Relation Age of Onset  . Hypertension Father   . Diabetes Father   .  Heart disease Father   . Lymphoma Mother   . Alcohol abuse Paternal Grandfather    Allergies  Allergen Reactions  . Penicillins     REACTION: Rash as a child - Tolerated AMOXICILLIN SEVERAL TIMES AS ADULT RECENTLY    Medication list has been reviewed and updated.   General: Denies fever, chills, sweats. No significant weight loss. Eyes: Denies blurring,significant itching ENT: Denies earache, sore throat, and hoarseness. Cardiovascular: Denies chest pains, palpitations, dyspnea on exertion Respiratory: Denies cough, dyspnea at rest,wheeezing Breast: no concerns about lumps GI: Denies nausea, vomiting, diarrhea, constipation, change in bowel habits,  abdominal pain, melena, hematochezia GU: Denies penile discharge, ED, urinary flow / outflow problems. No STD concerns. Musculoskeletal: Denies back pain, joint pain Derm: Denies rash, itching Neuro: Denies  paresthesias, frequent falls, frequent headaches Psych: Denies depression, anxiety Endocrine: Denies cold intolerance, heat intolerance, polydipsia Heme: Denies enlarged lymph nodes Allergy: No hayfever  Objective:   BP 128/72 mmHg  Pulse 75  Temp(Src) 98.3 F (36.8 C) (Oral)  Ht _0  (1.854 m)  Wt 233 lb 8 oz (105.915 kg)  BMI 30.81 kg/m2 Ideal Body Weight: Weight in (lb) to have BMI = 25: 189.1  No exam data present  GEN: well developed, well nourished, no acute distress Eyes: conjunctiva and lids normal, PERRLA, EOMI ENT: TM clear, nares clear, oral exam WNL Neck: supple, no lymphadenopathy, no thyromegaly, no JVD Pulm: clear to auscultation and percussion, respiratory effort normal CV: regular rate and rhythm, S1-S2, no murmur, rub or gallop, no bruits, peripheral pulses normal and symmetric, no cyanosis, clubbing, edema or varicosities GI: soft, non-tender; no hepatosplenomegaly, masses; active bowel sounds all quadrants GU: no hernia, testicular mass, penile discharge Lymph: no cervical, axillary or inguinal adenopathy MSK: gait normal, muscle tone and strength WNL, no joint swelling, effusions, discoloration, crepitus  SKIN: clear, good turgor, color WNL, no rashes, lesions, or ulcerations Neuro: normal mental status, normal strength, sensation, and motion Psych: alert; oriented to person, place and time, normally interactive and not anxious or depressed in appearance. All labs reviewed with patient.  Lipids:    Component Value Date/Time   CHOL 105 07/21/2015 1318   TRIG 121.0 07/21/2015 1318   HDL 25.10* 07/21/2015 1318   VLDL 24.2 07/21/2015 1318   CHOLHDL 4 07/21/2015 1318   CBC: CBC Latest Ref Rng 07/21/2015 05/21/2013 02/05/2013  WBC 4.0 - 10.5 K/uL  3.8(L) 9.9 3.8(L)  Hemoglobin 13.0 - 17.0 g/dL 13.5 17.2(H) 14.7  Hematocrit 39.0 - 52.0 % 42.3 51.1 43.3  Platelets 150.0 - 400.0 K/uL 126.0(L) 165 116.0(L)    Basic Metabolic Panel:    Component Value Date/Time   NA 141 07/21/2015 1318   K 4.3 07/21/2015 1318   CL 106 07/21/2015 1318   CO2 28 07/21/2015 1318   BUN 12 07/21/2015 1318   CREATININE 1.21 07/21/2015 1318   GLUCOSE 103* 07/21/2015 1318   CALCIUM 8.8 07/21/2015 1318   Hepatic Function Latest Ref Rng 07/21/2015 05/21/2013 02/05/2013  Total Protein 6.0 - 8.3 g/dL 6.6 9.0(H) 6.9  Albumin 3.5 - 5.2 g/dL 3.8 4.8 3.9  AST 0 - 37 U/L _1 ALT 0 - 53 U/L _2 Alk Phosphatase 39 - 117 U/L 89 129(H) 91  Total Bilirubin 0.2 - 1.2 mg/dL 0.6 1.0 0.6  Bilirubin, Direct 0.0 - 0.3 mg/dL - - 0.1    Lab Results  Component Value Date   TSH 3.19 12/02/2008   Lab Results  Component Value  Date   PSA 0.57 07/21/2015   PSA 0.57 02/05/2013   PSA 0.51 12/02/2008    Assessment and Plan:   Healthcare maintenance  Health Maintenance Exam: The patient's preventative maintenance and recommended screening tests for an annual wellness exam were reviewed in full today. Brought up to date unless services declined.  Counselled on the importance of diet, exercise, and its role in overall health and mortality. The patient's FH and SH was reviewed, including their home life, tobacco status, and drug and alcohol status.  Follow-up: No Follow-up on file. Unless noted, follow-up in 1 year for Health Maintenance Exam.  New Prescriptions   No medications on file   Modified Medications   Modified Medication Previous Medication   MELOXICAM (MOBIC) 7.5 MG TABLET meloxicam (MOBIC) 7.5 MG tablet      Take 1 tablet (7.5 mg total) by mouth as needed.    Take 1 tablet (7.5 mg total) by mouth daily.   No orders of the defined types were placed in this encounter.    Signed,  Maud Deed. Shayona Hibbitts, MD   Patient's Medications  New  Prescriptions   No medications on file  Previous Medications   ASPIRIN 81 MG TABLET    Take 81 mg by mouth daily.   FOLIC ACID (FOLVITE) 1 MG TABLET    Take 0.8 mg by mouth daily.    NAPROXEN (NAPROSYN) 250 MG TABLET    Take 250 mg by mouth as needed.   OMEPRAZOLE (PRILOSEC) 40 MG CAPSULE    Take 40 mg by mouth daily.    SAW PALMETTO 80 MG CAPSULE    Take 80 mg by mouth daily.    TRAMADOL (ULTRAM) 50 MG TABLET    Take 1 tablet (50 mg total) by mouth every 6 (six) hours as needed for pain.  Modified Medications   Modified Medication Previous Medication   MELOXICAM (MOBIC) 7.5 MG TABLET meloxicam (MOBIC) 7.5 MG tablet      Take 1 tablet (7.5 mg total) by mouth as needed.    Take 1 tablet (7.5 mg total) by mouth daily.  Discontinued Medications   No medications on file

## 2015-07-28 NOTE — Telephone Encounter (Signed)
Phillip [name] with [facility] left v/m requesting clarification of meloxicam instructions; how often per day can pt take medication?

## 2015-07-28 NOTE — Telephone Encounter (Signed)
once

## 2015-07-28 NOTE — Telephone Encounter (Signed)
Anna at [facility] notified once daily as needed, as well as a new prescription with updated instructions sent to [facility].

## 2015-07-28 NOTE — Progress Notes (Signed)
Pre visit review using our clinic review tool, if applicable. No additional management support is needed unless otherwise documented below in the visit note. 

## 2015-09-08 ENCOUNTER — Encounter: Payer: Self-pay | Admitting: Internal Medicine

## 2015-09-08 ENCOUNTER — Ambulatory Visit (INDEPENDENT_AMBULATORY_CARE_PROVIDER_SITE_OTHER): Payer: BLUE CROSS/BLUE SHIELD | Admitting: Internal Medicine

## 2015-09-08 VITALS — BP 126/76 | HR 82 | Temp 98.3°F | Wt 231.5 lb

## 2015-09-08 DIAGNOSIS — J069 Acute upper respiratory infection, unspecified: Secondary | ICD-10-CM

## 2015-09-08 MED ORDER — AMOXICILLIN 500 MG PO CAPS: 500.0000 mg | ORAL_CAPSULE | Freq: Three times a day (TID) | ORAL | Status: DC

## 2015-09-08 NOTE — Patient Instructions (Signed)
Upper Respiratory Infection, Adult Most upper respiratory infections (URIs) are a viral infection of the air passages leading to the lungs. A URI affects the nose, throat, and upper air passages. The most common type of URI is nasopharyngitis and is typically referred to as "the common cold." URIs run their course and usually go away on their own. Most of the time, a URI does not require medical attention, but sometimes a bacterial infection in the upper airways can follow a viral infection. This is called a secondary infection. Sinus and middle ear infections are common types of secondary upper respiratory infections. Bacterial pneumonia can also complicate a URI. A URI can worsen asthma and chronic obstructive pulmonary disease (COPD). Sometimes, these complications can require emergency medical care and may be life threatening.  CAUSES Almost all URIs are caused by viruses. A virus is a type of germ and can spread from one person to another.  RISKS FACTORS You may be at risk for a URI if:   You smoke.   You have chronic heart or lung disease.  You have a weakened defense (immune) system.   You are very young or very old.   You have nasal allergies or asthma.  You work in crowded or poorly ventilated areas.  You work in health care facilities or schools. SIGNS AND SYMPTOMS  Symptoms typically develop 2-3 days after you come in contact with a cold virus. Most viral URIs last 7-10 days. However, viral URIs from the influenza virus (flu virus) can last 14-18 days and are typically more severe. Symptoms may include:   Runny or stuffy (congested) nose.   Sneezing.   Cough.   Sore throat.   Headache.   Fatigue.   Fever.   Loss of appetite.   Pain in your forehead, behind your eyes, and over your cheekbones (sinus pain).  Muscle aches.  DIAGNOSIS  Your health care provider may diagnose a URI by:  Physical exam.  Tests to check that your symptoms are not due to  another condition such as:  Strep throat.  Sinusitis.  Pneumonia.  Asthma. TREATMENT  A URI goes away on its own with time. It cannot be cured with medicines, but medicines may be prescribed or recommended to relieve symptoms. Medicines may help:  Reduce your fever.  Reduce your cough.  Relieve nasal congestion. HOME CARE INSTRUCTIONS   Take medicines only as directed by your health care provider.   Gargle warm saltwater or take cough drops to comfort your throat as directed by your health care provider.  Use a warm mist humidifier or inhale steam from a shower to increase air moisture. This may make it easier to breathe.  Drink enough fluid to keep your urine clear or pale yellow.   Eat soups and other clear broths and maintain good nutrition.   Rest as needed.   Return to work when your temperature has returned to normal or as your health care provider advises. You may need to stay home longer to avoid infecting others. You can also use a face mask and careful hand washing to prevent spread of the virus.  Increase the usage of your inhaler if you have asthma.   Do not use any tobacco products, including cigarettes, chewing tobacco, or electronic cigarettes. If you need help quitting, ask your health care provider. PREVENTION  The best way to protect yourself from getting a cold is to practice good hygiene.   Avoid oral or hand contact with people with cold   symptoms.   Wash your hands often if contact occurs.  There is no clear evidence that vitamin C, vitamin E, echinacea, or exercise reduces the chance of developing a cold. However, it is always recommended to get plenty of rest, exercise, and practice good nutrition.  SEEK MEDICAL CARE IF:   You are getting worse rather than better.   Your symptoms are not controlled by medicine.   You have chills.  You have worsening shortness of breath.  You have brown or red mucus.  You have yellow or brown nasal  discharge.  You have pain in your face, especially when you bend forward.  You have a fever.  You have swollen neck glands.  You have pain while swallowing.  You have white areas in the back of your throat. SEEK IMMEDIATE MEDICAL CARE IF:   You have severe or persistent:  Headache.  Ear pain.  Sinus pain.  Chest pain.  You have chronic lung disease and any of the following:  Wheezing.  Prolonged cough.  Coughing up blood.  A change in your usual mucus.  You have a stiff neck.  You have changes in your:  Vision.  Hearing.  Thinking.  Mood. MAKE SURE YOU:   Understand these instructions.  Will watch your condition.  Will get help right away if you are not doing well or get worse.   This information is not intended to replace advice given to you by your health care provider. Make sure you discuss any questions you have with your health care provider.   Document Released: 01/26/2001 Document Revised: 12/17/2014 Document Reviewed: 11/07/2013 Elsevier Interactive Patient Education 2016 [employer]  

## 2015-09-08 NOTE — Progress Notes (Signed)
Subjective:    Patient ID: Phillip [name], male    DOB: 26-Sep-1950, 65 y.o.   MRN: [id number]  HPI  Pt presents to the clinic today with c/o ear fullness, post nasal drip and sore throat. This started 3 days ago. He denies decreased hearing. He is blowing yellow mucous out of his nose. He denies difficulty swallowing. He has had a slight cough, but reports it has not been productive. He denies shortness of breath. He denies fever or chills but has had body aches. He has tried Allergy and Tylenol with minimal relief. He does have a history of seasonal allergies. He has had sick contacts. He is concerned because he is leaving for [place] in 2 hours and will be gone for 2 weeks.  Review of Systems      Past Medical History  Diagnosis Date  . Arthritis   . Allergic rhinitis   . Colon polyps     Severe dysplasia in a right-sided polyp removed surgically in [place], 2006; final pathology showed this was not a cancer; follow up colonoscopy at one year interval was normal  . GERD (gastroesophageal reflux disease)     H/o Barrett's esophagus, orginally diagnosed in 2006, biopsies showed no dysplasia; follow up EGD also showed no dysplasia  . Lyme disease     x2  . Prostate infection   . Erectile dysfunction   . History of BPH     Current Outpatient Prescriptions  Medication Sig Dispense Refill  . aspirin 81 MG tablet Take 81 mg by mouth daily.    . folic acid (FOLVITE) 1 MG tablet Take 0.8 mg by mouth daily.     . meloxicam (MOBIC) 7.5 MG tablet Take 1 tablet (7.5 mg total) by mouth daily as needed. 30 tablet 5  . naproxen (NAPROSYN) 250 MG tablet Take 250 mg by mouth as needed.    Marland Kitchen omeprazole (PRILOSEC) 40 MG capsule Take 40 mg by mouth daily.     . saw palmetto 80 MG capsule Take 80 mg by mouth daily.      No current facility-administered medications for this visit.    Allergies  Allergen Reactions  . Penicillins     REACTION: Rash as a child - Tolerated AMOXICILLIN  SEVERAL TIMES AS ADULT RECENTLY    Family History  Problem Relation Age of Onset  . Hypertension Father   . Diabetes Father   . Heart disease Father   . Lymphoma Mother   . Alcohol abuse Paternal Grandfather     Social History   Social History  . Marital Status: Married    Spouse Name: N/A  . Number of Children: 3  . Years of Education: N/A   Occupational History  . [employer], [place] History Main Topics  . Smoking status: Never Smoker   . Smokeless tobacco: Never Used  . Alcohol Use: No  . Drug Use: No  . Sexual Activity: Not on file   Other Topics Concern  . Not on file   Social History Narrative   Married   Gets regular exercise     Constitutional: Pt reports fatigue. Denies fever, malaise, headache or abrupt weight changes.  HEENT: Pt reports ear fullness, sore throat. Denies eye pain, eye redness, ear pain, ringing in the ears, wax buildup, runny nose, nasal congestion, bloody nose. Respiratory: Pt reports cough. Denies difficulty breathing, shortness of breath, or sputum production.   Cardiovascular: Denies chest pain, chest  tightness, palpitations or swelling in the hands or feet.    No other specific complaints in a complete review of systems (except as listed in HPI above).  Objective:   Physical Exam  BP 126/76 mmHg  Pulse 82  Temp(Src) 98.3 F (36.8 C) (Oral)  Wt 231 lb 8 oz (105.008 kg)  SpO2 98% Wt Readings from Last 3 Encounters:  09/08/15 231 lb 8 oz (105.008 kg)  07/28/15 233 lb 8 oz (105.915 kg)  07/23/14 223 lb (101.152 kg)    General: Appears his stated age, in NAD. HEENT: Head: normal shape and size, no sinus tenderness noted; Eyes: sclera white, no icterus, conjunctiva pink; Ears: Tm's gray and intact, normal light reflex, +serous effusion bilaterally; Nose: mucosa pink and moist, septum midline; Throat/Mouth: Teeth present, mucosa erythematous and moist, no exudate, lesions or ulcerations noted.  Neck:  No  adenopathy noted.  Cardiovascular: Normal rate and rhythm. S1,S2 noted.  No murmur, rubs or gallops noted.  Pulmonary/Chest: Normal effort and positive vesicular breath sounds. No respiratory distress. No wheezes, rales or ronchi noted.   BMET    Component Value Date/Time   NA 141 07/21/2015 1318   K 4.3 07/21/2015 1318   CL 106 07/21/2015 1318   CO2 28 07/21/2015 1318   GLUCOSE 103* 07/21/2015 1318   BUN 12 07/21/2015 1318   CREATININE 1.21 07/21/2015 1318   CALCIUM 8.8 07/21/2015 1318   GFRNONAA 46* 05/21/2013 1952   GFRAA 53* 05/21/2013 1952    Lipid Panel     Component Value Date/Time   CHOL 105 07/21/2015 1318   TRIG 121.0 07/21/2015 1318   HDL 25.10* 07/21/2015 1318   CHOLHDL 4 07/21/2015 1318   VLDL 24.2 07/21/2015 1318   LDLCALC 56 07/21/2015 1318    CBC    Component Value Date/Time   WBC 3.8* 07/21/2015 1318   RBC 5.11 07/21/2015 1318   HGB 13.5 07/21/2015 1318   HCT 42.3 07/21/2015 1318   PLT 126.0* 07/21/2015 1318   MCV 82.8 07/21/2015 1318   MCH 28.8 05/21/2013 1952   MCHC 32.0 07/21/2015 1318   RDW 17.5* 07/21/2015 1318   LYMPHSABS 1.4 07/21/2015 1318   MONOABS 0.3 07/21/2015 1318   EOSABS 0.1 07/21/2015 1318   BASOSABS 0.0 07/21/2015 1318    Hgb A1C No results found for: HGBA1C       Assessment & Plan:   Viral upper respiratory infection:  Self limiting Continue antihistamine and Tylenol Add Flonase OTC eRx for Amoxicillin 500 mg TID to start taking if you develop fever, chills, green nasal mucous or coughing productive of green mucous ( he request this despite PCN allergy, he has taken this in the last few years without incident)  RTC as needed or if symptoms persist or worsen

## 2015-09-08 NOTE — Progress Notes (Signed)
Pre visit review using our clinic review tool, if applicable. No additional management support is needed unless otherwise documented below in the visit note. 

## 2015-10-11 ENCOUNTER — Encounter: Payer: Self-pay | Admitting: Family Medicine

## 2015-10-11 ENCOUNTER — Ambulatory Visit (INDEPENDENT_AMBULATORY_CARE_PROVIDER_SITE_OTHER): Payer: BLUE CROSS/BLUE SHIELD | Admitting: Family Medicine

## 2015-10-11 VITALS — BP 140/86 | HR 75 | Temp 98.3°F | Resp 20 | Ht 73.0 in | Wt 229.8 lb

## 2015-10-11 DIAGNOSIS — J209 Acute bronchitis, unspecified: Secondary | ICD-10-CM

## 2015-10-11 MED ORDER — DOXYCYCLINE HYCLATE 100 MG PO TABS: 100.0000 mg | ORAL_TABLET | Freq: Two times a day (BID) | ORAL | Status: DC

## 2015-10-11 MED ORDER — ALBUTEROL SULFATE HFA 108 (90 BASE) MCG/ACT IN AERS: 2.0000 | INHALATION_SPRAY | Freq: Four times a day (QID) | RESPIRATORY_TRACT | Status: DC | PRN

## 2015-10-11 MED ORDER — HYDROCODONE-HOMATROPINE 5-1.5 MG/5ML PO SYRP: 5.0000 mL | ORAL_SOLUTION | Freq: Three times a day (TID) | ORAL | Status: DC | PRN

## 2015-10-11 NOTE — Progress Notes (Signed)
Pre visit review using our clinic review tool, if applicable. No additional management support is needed unless otherwise documented below in the visit note. 

## 2015-10-11 NOTE — Assessment & Plan Note (Signed)
Encouraged increased rest and hydration, add probiotics, zinc such as Coldeze or Xicam. Treat fevers as needed. Given Doxycycline. Hyrodromet and Albuterol to use as needed

## 2015-10-11 NOTE — Patient Instructions (Addendum)
Encouraged increased rest and hydration, add probiotics, zinc such as Coldeze or Xicam. Treat fevers as needed. Elderberry, aged or black garlic, Vitamin [id number] to [id number] mg daily. Mucinex  Acute Bronchitis Bronchitis is inflammation of the airways that extend from the windpipe into the lungs (bronchi). The inflammation often causes mucus to develop. This leads to a cough, which is the most common symptom of bronchitis.  In acute bronchitis, the condition usually develops suddenly and goes away over time, usually in a couple weeks. Smoking, allergies, and asthma can make bronchitis worse. Repeated episodes of bronchitis may cause further lung problems.  CAUSES Acute bronchitis is most often caused by the same virus that causes a cold. The virus can spread from person to person (contagious) through coughing, sneezing, and touching contaminated objects. SIGNS AND SYMPTOMS   Cough.   Fever.   Coughing up mucus.   Body aches.   Chest congestion.   Chills.   Shortness of breath.   Sore throat.  DIAGNOSIS  Acute bronchitis is usually diagnosed through a physical exam. Your health care provider will also ask you questions about your medical history. Tests, such as chest X-rays, are sometimes done to rule out other conditions.  TREATMENT  Acute bronchitis usually goes away in a couple weeks. Oftentimes, no medical treatment is necessary. Medicines are sometimes given for relief of fever or cough. Antibiotic medicines are usually not needed but may be prescribed in certain situations. In some cases, an inhaler may be recommended to help reduce shortness of breath and control the cough. A cool mist vaporizer may also be used to help thin bronchial secretions and make it easier to clear the chest.  HOME CARE INSTRUCTIONS  Get plenty of rest.   Drink enough fluids to keep your urine clear or pale yellow (unless you have a medical condition that requires fluid restriction). Increasing fluids  may help thin your respiratory secretions (sputum) and reduce chest congestion, and it will prevent dehydration.   Take medicines only as directed by your health care provider.  If you were prescribed an antibiotic medicine, finish it all even if you start to feel better.  Avoid smoking and secondhand smoke. Exposure to cigarette smoke or irritating chemicals will make bronchitis worse. If you are a smoker, consider using nicotine gum or skin patches to help control withdrawal symptoms. Quitting smoking will help your lungs heal faster.   Reduce the chances of another bout of acute bronchitis by washing your hands frequently, avoiding people with cold symptoms, and trying not to touch your hands to your mouth, nose, or eyes.   Keep all follow-up visits as directed by your health care provider.  SEEK MEDICAL CARE IF: Your symptoms do not improve after 1 week of treatment.  SEEK IMMEDIATE MEDICAL CARE IF:  You develop an increased fever or chills.   You have chest pain.   You have severe shortness of breath.  You have bloody sputum.   You develop dehydration.  You faint or repeatedly feel like you are going to pass out.  You develop repeated vomiting.  You develop a severe headache. MAKE SURE YOU:   Understand these instructions.  Will watch your condition.  Will get help right away if you are not doing well or get worse.   This information is not intended to replace advice given to you by your health care provider. Make sure you discuss any questions you have with your health care provider.   Document Released: 09/09/2004 Document Revised:  08/23/2014 Document Reviewed: 01/23/2013 Elsevier Interactive Patient Education [employer].

## 2015-10-12 NOTE — Progress Notes (Signed)
Patient ID: Phillip [name], male   DOB: [birth year], 65 y.o.   MRN: [id number]   Subjective:    Patient ID: Phillip [name], male    DOB: [birth year], 65 y.o.   MRN: [id number]  Chief Complaint  Patient presents with  . Cough  . chest congestion    HPI Patient is in today for evaluation of respiratory symptoms. Is that her brother acute respiratory symptoms. He was treated a couple weeks ago for sinusitis with symptoms of headache ear pain and throat pain with amoxicillin and he improved somewhat but presently is struggling with chest congestion low-grade headache fatigue green rhinorrhea malaise and myalgias. Denies fevers and chills. No GI complaints. Past Medical History  Diagnosis Date  . Arthritis   . Allergic rhinitis   . Colon polyps     Severe dysplasia in a right-sided polyp removed surgically in [place], 2006; final pathology showed this was not a cancer; follow up colonoscopy at one year interval was normal  . GERD (gastroesophageal reflux disease)     H/o Barrett's esophagus, orginally diagnosed in 2006, biopsies showed no dysplasia; follow up EGD also showed no dysplasia  . Lyme disease     x2  . Prostate infection   . Erectile dysfunction   . History of BPH   . Acute bronchitis 10/11/2015    Past Surgical History  Procedure Laterality Date  . Tonsillectomy      As a child  . Hernia repair  [id number]    UMBILICAL  . 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 Lumbar disc surgery  1992    L4-5  . Varicose vein surgery  1997    Removal, right  . Hemicolectomy  2006    Right, for severely dysplastic right colon polyp  . Colonoscopy  12/2008    Repeat 5 years  . Esophagogastroduodenoscopy  12/2008    Barrett's, repeat 3 years  . Upper gastrointestinal endoscopy      Family History  Problem Relation Age of Onset  . Hypertension Father   . Diabetes Father   . Heart disease Father   . Lymphoma Mother   . Alcohol abuse Paternal Grandfather     Social  History   Social History  . Marital Status: Married    Spouse Name: N/A  . Number of Children: 3  . Years of Education: N/A   Occupational History  . [employer], [place] History Main Topics  . Smoking status: Never Smoker   . Smokeless tobacco: Never Used  . Alcohol Use: No  . Drug Use: No  . Sexual Activity: Not on file   Other Topics Concern  . Not on file   Social History Narrative   Married   Gets regular exercise    Outpatient Prescriptions Prior to Visit  Medication Sig Dispense Refill  . aspirin 81 MG tablet Take 81 mg by mouth daily.    . folic acid (FOLVITE) 1 MG tablet Take 0.8 mg by mouth daily.     . meloxicam (MOBIC) 7.5 MG tablet Take 1 tablet (7.5 mg total) by mouth daily as needed. 30 tablet 5  . naproxen (NAPROSYN) 250 MG tablet Take 250 mg by mouth as needed.    Marland Kitchen omeprazole (PRILOSEC) 40 MG capsule Take 40 mg by mouth daily.     . saw palmetto 80 MG capsule Take 80 mg by mouth daily.     Marland Kitchen amoxicillin (  AMOXIL) 500 MG capsule Take 1 capsule (500 mg total) by mouth 3 (three) times daily. 30 capsule 0   No facility-administered medications prior to visit.    Allergies  Allergen Reactions  . Penicillins     REACTION: Rash as a child - Tolerated AMOXICILLIN SEVERAL TIMES AS ADULT RECENTLY    Review of Systems  Constitutional: Positive for malaise/fatigue. Negative for fever.  HENT: Positive for ear pain and sore throat. Negative for congestion.   Eyes: Negative for discharge.  Respiratory: Positive for cough and sputum production. Negative for shortness of breath.   Cardiovascular: Negative for chest pain, palpitations and leg swelling.  Gastrointestinal: Negative for nausea and abdominal pain.  Genitourinary: Negative for dysuria.  Musculoskeletal: Negative for falls.  Skin: Negative for rash.  Neurological: Positive for weakness. Negative for loss of consciousness.  Endo/Heme/Allergies: Negative for environmental allergies.    Psychiatric/Behavioral: Negative for depression. The patient is not nervous/anxious.        Objective:    Physical Exam  Constitutional: He is oriented to person, place, and time. He appears well-developed and well-nourished. No distress.  HENT:  Head: Normocephalic and atraumatic.  Nose: Nose normal.  Eyes: Right eye exhibits no discharge. Left eye exhibits no discharge.  Neck: Normal range of motion. Neck supple.  Cardiovascular: Normal rate and regular rhythm.   No murmur heard. Pulmonary/Chest: Effort normal and breath sounds normal.  Rhonchi b/l bases  Abdominal: Soft. Bowel sounds are normal. There is no tenderness.  Musculoskeletal: He exhibits no edema.  Neurological: He is alert and oriented to person, place, and time.  Skin: Skin is warm and dry.  Psychiatric: He has a normal mood and affect.  Nursing note and vitals reviewed.   BP 140/86 mmHg  Pulse 75  Temp(Src) 98.3 F (36.8 C) (Oral)  Resp 20  Ht 6\' 1"  (1.854 m)  Wt 229 lb 12 oz (104.214 kg)  BMI 30.32 kg/m2  SpO2 99% Wt Readings from Last 3 Encounters:  10/11/15 229 lb 12 oz (104.214 kg)  09/08/15 231 lb 8 oz (105.008 kg)  07/28/15 233 lb 8 oz (105.915 kg)     Lab Results  Component Value Date   WBC 3.8* 07/21/2015   HGB 13.5 07/21/2015   HCT 42.3 07/21/2015   PLT 126.0* 07/21/2015   GLUCOSE 103* 07/21/2015   CHOL 105 07/21/2015   TRIG 121.0 07/21/2015   HDL 25.10* 07/21/2015   LDLCALC 56 07/21/2015   ALT 15 07/21/2015   AST 16 07/21/2015   NA 141 07/21/2015   K 4.3 07/21/2015   CL 106 07/21/2015   CREATININE 1.21 07/21/2015   BUN 12 07/21/2015   CO2 28 07/21/2015   TSH 3.19 12/02/2008   PSA 0.57 07/21/2015    Lab Results  Component Value Date   TSH 3.19 12/02/2008   Lab Results  Component Value Date   WBC 3.8* 07/21/2015   HGB 13.5 07/21/2015   HCT 42.3 07/21/2015   MCV 82.8 07/21/2015   PLT 126.0* 07/21/2015   Lab Results  Component Value Date   NA 141 07/21/2015   K  4.3 07/21/2015   CO2 28 07/21/2015   GLUCOSE 103* 07/21/2015   BUN 12 07/21/2015   CREATININE 1.21 07/21/2015   BILITOT 0.6 07/21/2015   ALKPHOS 89 07/21/2015   AST 16 07/21/2015   ALT 15 07/21/2015   PROT 6.6 07/21/2015   ALBUMIN 3.8 07/21/2015   CALCIUM 8.8 07/21/2015   GFR 64.06 07/21/2015   Lab Results  Component Value Date   CHOL 105 07/21/2015   Lab Results  Component Value Date   HDL 25.10* 07/21/2015   Lab Results  Component Value Date   LDLCALC 56 07/21/2015   Lab Results  Component Value Date   TRIG 121.0 07/21/2015   Lab Results  Component Value Date   CHOLHDL 4 07/21/2015   No results found for: HGBA1C     Assessment & Plan:   Problem List Items Addressed This Visit    Acute bronchitis - Primary    Encouraged increased rest and hydration, add probiotics, zinc such as Coldeze or Xicam. Treat fevers as needed. Given Doxycycline. Hyrodromet and Albuterol to use as needed         I have discontinued Mr. Geddis's amoxicillin. I am also having him start on albuterol, doxycycline, and HYDROcodone-homatropine. Additionally, I am having him maintain his folic acid, omeprazole, saw palmetto, aspirin, naproxen, and meloxicam.  Meds ordered this encounter  Medications  . albuterol (PROVENTIL HFA;VENTOLIN HFA) 108 (90 Base) MCG/ACT inhaler    Sig: Inhale 2 puffs into the lungs every 6 (six) hours as needed for wheezing or shortness of breath.    Dispense:  1 Inhaler    Refill:  0  . doxycycline (VIBRA-TABS) 100 MG tablet    Sig: Take 1 tablet (100 mg total) by mouth 2 (two) times daily.    Dispense:  20 tablet    Refill:  0  . HYDROcodone-homatropine (HYCODAN) 5-1.5 MG/5ML syrup    Sig: Take 5 mLs by mouth every 8 (eight) hours as needed for cough.    Dispense:  120 mL    Refill:  0     Penni Homans, MD

## 2015-11-03 ENCOUNTER — Other Ambulatory Visit: Payer: Self-pay

## 2015-11-03 MED ORDER — DICYCLOMINE HCL 20 MG PO TABS: 20.0000 mg | ORAL_TABLET | Freq: Two times a day (BID) | ORAL | Status: DC | PRN

## 2015-11-03 MED ORDER — ONDANSETRON 4 MG PO TBDP: ORAL_TABLET | ORAL | Status: DC

## 2015-11-03 NOTE — Telephone Encounter (Signed)
Pt left v/m; pt is not sick but is traveling to [place] and request refill ondansetron(last refilled # 4 on 05/22/2013) and dicyclomine(last refilled # 20 on 05/22/2013) in case needed while out of the country.last annual exam 07/28/15.

## 2016-05-10 ENCOUNTER — Ambulatory Visit (INDEPENDENT_AMBULATORY_CARE_PROVIDER_SITE_OTHER): Payer: Medicare Other

## 2016-05-10 DIAGNOSIS — Z23 Encounter for immunization: Secondary | ICD-10-CM

## 2016-06-07 ENCOUNTER — Encounter: Payer: Self-pay | Admitting: Gastroenterology

## 2016-07-12 ENCOUNTER — Inpatient Hospital Stay (HOSPITAL_COMMUNITY)
Admission: EM | Admit: 2016-07-12 | Discharge: 2016-07-15 | DRG: 247 | Disposition: A | Discharge status: Home / Self Care | Payer: Medicare Other | Attending: Internal Medicine | Admitting: Internal Medicine

## 2016-07-12 ENCOUNTER — Emergency Department (HOSPITAL_COMMUNITY): Payer: Medicare Other

## 2016-07-12 ENCOUNTER — Encounter (HOSPITAL_COMMUNITY): Payer: Self-pay

## 2016-07-12 ENCOUNTER — Telehealth: Payer: Self-pay | Admitting: Family Medicine

## 2016-07-12 DIAGNOSIS — I209 Angina pectoris, unspecified: Secondary | ICD-10-CM

## 2016-07-12 DIAGNOSIS — I129 Hypertensive chronic kidney disease with stage 1 through stage 4 chronic kidney disease, or unspecified chronic kidney disease: Secondary | ICD-10-CM | POA: Diagnosis present

## 2016-07-12 DIAGNOSIS — Z880 Allergy status to penicillin: Secondary | ICD-10-CM

## 2016-07-12 DIAGNOSIS — E669 Obesity, unspecified: Secondary | ICD-10-CM | POA: Diagnosis present

## 2016-07-12 DIAGNOSIS — N183 Chronic kidney disease, stage 3 unspecified: Secondary | ICD-10-CM

## 2016-07-12 DIAGNOSIS — R079 Chest pain, unspecified: Secondary | ICD-10-CM

## 2016-07-12 DIAGNOSIS — I200 Unstable angina: Secondary | ICD-10-CM | POA: Diagnosis not present

## 2016-07-12 DIAGNOSIS — I25110 Atherosclerotic heart disease of native coronary artery with unstable angina pectoris: Secondary | ICD-10-CM | POA: Diagnosis not present

## 2016-07-12 DIAGNOSIS — D61818 Other pancytopenia: Secondary | ICD-10-CM | POA: Diagnosis not present

## 2016-07-12 DIAGNOSIS — R072 Precordial pain: Secondary | ICD-10-CM | POA: Diagnosis not present

## 2016-07-12 DIAGNOSIS — Z6829 Body mass index (BMI) 29.0-29.9, adult: Secondary | ICD-10-CM

## 2016-07-12 DIAGNOSIS — K219 Gastro-esophageal reflux disease without esophagitis: Secondary | ICD-10-CM | POA: Diagnosis present

## 2016-07-12 DIAGNOSIS — Z807 Family history of other malignant neoplasms of lymphoid, hematopoietic and related tissues: Secondary | ICD-10-CM

## 2016-07-12 DIAGNOSIS — Z86010 Personal history of colonic polyps: Secondary | ICD-10-CM

## 2016-07-12 DIAGNOSIS — N400 Enlarged prostate without lower urinary tract symptoms: Secondary | ICD-10-CM | POA: Diagnosis present

## 2016-07-12 DIAGNOSIS — K760 Fatty (change of) liver, not elsewhere classified: Secondary | ICD-10-CM | POA: Diagnosis present

## 2016-07-12 DIAGNOSIS — R0789 Other chest pain: Secondary | ICD-10-CM | POA: Diagnosis not present

## 2016-07-12 DIAGNOSIS — K8020 Calculus of gallbladder without cholecystitis without obstruction: Secondary | ICD-10-CM | POA: Diagnosis present

## 2016-07-12 DIAGNOSIS — Z7982 Long term (current) use of aspirin: Secondary | ICD-10-CM

## 2016-07-12 DIAGNOSIS — Z955 Presence of coronary angioplasty implant and graft: Secondary | ICD-10-CM

## 2016-07-12 DIAGNOSIS — Z791 Long term (current) use of non-steroidal anti-inflammatories (NSAID): Secondary | ICD-10-CM

## 2016-07-12 DIAGNOSIS — N179 Acute kidney failure, unspecified: Secondary | ICD-10-CM | POA: Diagnosis not present

## 2016-07-12 DIAGNOSIS — Z8249 Family history of ischemic heart disease and other diseases of the circulatory system: Secondary | ICD-10-CM

## 2016-07-12 DIAGNOSIS — Z833 Family history of diabetes mellitus: Secondary | ICD-10-CM

## 2016-07-12 DIAGNOSIS — D696 Thrombocytopenia, unspecified: Secondary | ICD-10-CM | POA: Diagnosis present

## 2016-07-12 LAB — CBC
HEMATOCRIT: 41.9 % (ref 39.0–52.0)
HEMATOCRIT: 41 % (ref 39.0–52.0)
Hemoglobin: 13.2 g/dL (ref 13.0–17.0)
Hemoglobin: 13.1 g/dL (ref 13.0–17.0)
MCH: 26 pg (ref 26.0–34.0)
MCH: 26.5 pg (ref 26.0–34.0)
MCHC: 31.5 g/dL (ref 30.0–36.0)
MCHC: 32 g/dL (ref 30.0–36.0)
MCV: 82.6 fL (ref 78.0–100.0)
MCV: 82.8 fL (ref 78.0–100.0)
PLATELETS: 136 10*3/uL — AB (ref 150–400)
PLATELETS: 122 10*3/uL — AB (ref 150–400)
RBC: 5.07 MIL/uL (ref 4.22–5.81)
RBC: 4.95 MIL/uL (ref 4.22–5.81)
RDW: 16.8 % — AB (ref 11.5–15.5)
RDW: 16.7 % — AB (ref 11.5–15.5)
WBC: 4.7 10*3/uL (ref 4.0–10.5)
WBC: 4.5 10*3/uL (ref 4.0–10.5)

## 2016-07-12 LAB — COMPREHENSIVE METABOLIC PANEL
ALBUMIN: 3.9 g/dL (ref 3.5–5.0)
ALT: 18 U/L (ref 17–63)
AST: 24 U/L (ref 15–41)
Alkaline Phosphatase: 107 U/L (ref 38–126)
Anion gap: 7 (ref 5–15)
BUN: 15 mg/dL (ref 6–20)
CHLORIDE: 106 mmol/L (ref 101–111)
CO2: 24 mmol/L (ref 22–32)
CREATININE: 1.35 mg/dL — AB (ref 0.61–1.24)
Calcium: 9.1 mg/dL (ref 8.9–10.3)
GFR calc Af Amer: >60 mL/min (ref >60)
GFR calc non Af Amer: 54 mL/min — ABNORMAL LOW (ref >60)
GLUCOSE: 109 mg/dL — AB (ref 65–99)
POTASSIUM: 4.6 mmol/L (ref 3.5–5.1)
Sodium: 137 mmol/L (ref 135–145)
Total Bilirubin: 0.6 mg/dL (ref 0.3–1.2)
Total Protein: 6.7 g/dL (ref 6.5–8.1)

## 2016-07-12 LAB — RAPID URINE DRUG SCREEN, HOSP PERFORMED
Amphetamines: NOT DETECTED
BARBITURATES: NOT DETECTED
Benzodiazepines: NOT DETECTED
COCAINE: NOT DETECTED
Opiates: NOT DETECTED
TETRAHYDROCANNABINOL: NOT DETECTED

## 2016-07-12 LAB — D-DIMER, QUANTITATIVE: D-Dimer, Quant: 0.4 ug/mL-FEU (ref 0.00–0.50)

## 2016-07-12 LAB — TROPONIN I
TROPONIN I: 0.03 ng/mL — AB (ref <0.03)
Troponin I: <0.03 ng/mL (ref <0.03)
Troponin I: <0.03 ng/mL (ref <0.03)

## 2016-07-12 LAB — CREATININE, SERUM
Creatinine, Ser: 1.28 mg/dL — ABNORMAL HIGH (ref 0.61–1.24)
GFR calc Af Amer: >60 mL/min (ref >60)
GFR, EST NON AFRICAN AMERICAN: 57 mL/min — AB (ref >60)

## 2016-07-12 LAB — I-STAT TROPONIN, ED: Troponin i, poc: 0 ng/mL (ref 0.00–0.08)

## 2016-07-12 LAB — TSH: TSH: 1.9 u[IU]/mL (ref 0.350–4.500)

## 2016-07-12 MED ORDER — PANTOPRAZOLE SODIUM 40 MG PO TBEC
40.0000 mg | DELAYED_RELEASE_TABLET | Freq: Every day | ORAL | Status: DC
  Administered 2016-07-13 – 2016-07-15 (×3): 40 mg via ORAL
  Filled 2016-07-12 (×3): qty 1

## 2016-07-12 MED ORDER — ACETAMINOPHEN 325 MG PO TABS: 650.0000 mg | ORAL_TABLET | ORAL | Status: DC | PRN

## 2016-07-12 MED ORDER — ZOLPIDEM TARTRATE 5 MG PO TABS
5.0000 mg | ORAL_TABLET | Freq: Every evening | ORAL | Status: DC | PRN
  Administered 2016-07-12 – 2016-07-14 (×3): 5 mg via ORAL
  Filled 2016-07-12 (×3): qty 1

## 2016-07-12 MED ORDER — ASPIRIN 81 MG PO CHEW
81.0000 mg | CHEWABLE_TABLET | Freq: Every day | ORAL | Status: DC
  Administered 2016-07-13 – 2016-07-15 (×3): 81 mg via ORAL
  Filled 2016-07-12 (×3): qty 1

## 2016-07-12 MED ORDER — HYDROCODONE-ACETAMINOPHEN 5-325 MG PO TABS: 1.0000 | ORAL_TABLET | ORAL | Status: DC | PRN

## 2016-07-12 MED ORDER — ALBUTEROL SULFATE (2.5 MG/3ML) 0.083% IN NEBU
2.0000 mL | INHALATION_SOLUTION | Freq: Four times a day (QID) | RESPIRATORY_TRACT | Status: DC | PRN
Start: 2016-07-12 — End: 2016-07-15

## 2016-07-12 MED ORDER — SODIUM CHLORIDE 0.9 % IV SOLN
INTRAVENOUS | Status: DC
  Administered 2016-07-12 – 2016-07-13 (×2): via INTRAVENOUS

## 2016-07-12 MED ORDER — ENOXAPARIN SODIUM 40 MG/0.4ML ~~LOC~~ SOLN
40.0000 mg | SUBCUTANEOUS | Status: DC
  Administered 2016-07-12 – 2016-07-13 (×2): 40 mg via SUBCUTANEOUS
  Filled 2016-07-12 (×2): qty 0.4

## 2016-07-12 MED ORDER — SAW PALMETTO (SERENOA REPENS) 80 MG PO CAPS: 80.0000 mg | ORAL_CAPSULE | Freq: Every day | ORAL | Status: DC

## 2016-07-12 MED ORDER — ONDANSETRON HCL 4 MG/2ML IJ SOLN: 4.0000 mg | Freq: Four times a day (QID) | INTRAMUSCULAR | Status: DC | PRN

## 2016-07-12 MED ORDER — NITROGLYCERIN 0.4 MG SL SUBL: 0.4000 mg | SUBLINGUAL_TABLET | SUBLINGUAL | Status: DC | PRN

## 2016-07-12 NOTE — ED Provider Notes (Signed)
[facility] DEPT Provider Note   CSN: [id number] Arrival date & time: 07/12/16  1436     History   Chief Complaint Chief Complaint  Patient presents with  . Chest Pain    pt coming in for evalution of CP for the past four days that starts just below his sternum and radiates to jaw and L hand occurs with slight exertion    HPI Phillip [name] is a 65 y.o. male.   Chest Pain   This is a recurrent problem. The current episode started 1 to 2 hours ago. The problem occurs constantly. The problem has been resolved. The pain is associated with exertion. The pain is present in the substernal region. The pain is moderate. The quality of the pain is described as brief, pressure-like and heavy. The pain radiates to the left jaw and left neck. Duration of episode(s) is 30 minutes. Associated symptoms include shortness of breath. Pertinent negatives include no cough, no diaphoresis and no nausea. He has tried nitroglycerin for the symptoms. The treatment provided mild relief. Risk factors include male gender, obesity and being elderly.    Past Medical History:  Diagnosis Date  . Acute bronchitis 10/11/2015  . Allergic rhinitis   . Arthritis   . Colon polyps    Severe dysplasia in a right-sided polyp removed surgically in [place], 2006; final pathology showed this was not a cancer; follow up colonoscopy at one year interval was normal  . Erectile dysfunction   . GERD (gastroesophageal reflux disease)    H/o Barrett's esophagus, orginally diagnosed in 2006, biopsies showed no dysplasia; follow up EGD also showed no dysplasia  . History of BPH   . Lyme disease    x2  . Prostate infection     Patient Active Problem List   Diagnosis Date Noted  . Acute bronchitis 10/11/2015  . Renal insufficiency 07/21/2015  . Thrombocytopenia ([place]) 04/19/2012  . BARRETTS ESOPHAGUS 12/16/2008  . ERECTILE DYSFUNCTION 09/26/2008  . ALLERGIC RHINITIS 09/26/2008  . GERD 09/26/2008  . COLONIC  POLYPS, HX OF 09/26/2008    Past Surgical History:  Procedure Laterality Date  . COLONOSCOPY  12/2008   Repeat 5 years  . ESOPHAGOGASTRODUODENOSCOPY  12/2008   Barrett's, repeat 3 years  . HEMICOLECTOMY  2006   Right, for severely dysplastic right colon polyp  . HERNIA REPAIR  [id number]   UMBILICAL  . LUMBAR DISC SURGERY  1992   L4-5  . TONSILLECTOMY     As a child  . TUMOR REMOVAL  2002   Fatty tumor (neck)  . UPPER GASTROINTESTINAL ENDOSCOPY    . VARICOSE VEIN SURGERY  1997   Removal, right       Home Medications    Prior to Admission medications   Medication Sig Start Date End Date Taking? Authorizing Provider  albuterol (PROVENTIL HFA;VENTOLIN HFA) 108 (90 Base) MCG/ACT inhaler Inhale 2 puffs into the lungs every 6 (six) hours as needed for wheezing or shortness of breath. 10/11/15   Mosie Lukes, MD  aspirin 81 MG tablet Take 81 mg by mouth daily.    Historical Provider, MD  dicyclomine (BENTYL) 20 MG tablet Take 1 tablet (20 mg total) by mouth 2 (two) times daily as needed (cramps). 11/03/15   Owens Loffler, MD  doxycycline (VIBRA-TABS) 100 MG tablet Take 1 tablet (100 mg total) by mouth 2 (two) times daily. 10/11/15   Mosie Lukes, MD  folic acid (FOLVITE) 1 MG tablet Take 0.8 mg by mouth daily.  Historical Provider, MD  HYDROcodone-homatropine (HYCODAN) 5-1.5 MG/5ML syrup Take 5 mLs by mouth every 8 (eight) hours as needed for cough. 10/11/15   Mosie Lukes, MD  meloxicam (MOBIC) 7.5 MG tablet Take 1 tablet (7.5 mg total) by mouth daily as needed. 07/28/15   Owens Loffler, MD  naproxen (NAPROSYN) 250 MG tablet Take 250 mg by mouth as needed.    Historical Provider, MD  omeprazole (PRILOSEC) 40 MG capsule Take 40 mg by mouth daily.     Historical Provider, MD  ondansetron (ZOFRAN ODT) 4 MG disintegrating tablet 4mg  ODT q4 hours prn nausea/vomit 11/03/15   Owens Loffler, MD  saw palmetto 80 MG capsule Take 80 mg by mouth daily.     Historical Provider, MD     Family History Family History  Problem Relation Age of Onset  . Hypertension Father   . Diabetes Father   . Heart disease Father   . Lymphoma Mother   . Alcohol abuse Paternal Grandfather     Social History Social History  Substance Use Topics  . Smoking status: Never Smoker  . Smokeless tobacco: Never Used  . Alcohol use No     Allergies   Penicillins   Review of Systems Review of Systems  Constitutional: Negative for diaphoresis.  Respiratory: Positive for shortness of breath. Negative for cough.   Cardiovascular: Positive for chest pain.  Gastrointestinal: Negative for nausea.  All other systems reviewed and are negative.    Physical Exam Updated Vital Signs BP 126/80 (BP Location: Right Arm)   Pulse 79   Temp 98.4 F (36.9 C) (Oral)   Resp 20   Ht 6' (1.829 m)   SpO2 100%   Physical Exam  Constitutional: He appears well-developed and well-nourished.  HENT:  Head: Normocephalic and atraumatic.  Eyes: Conjunctivae and EOM are normal.  Neck: Normal range of motion.  Cardiovascular: Normal rate.   Pulmonary/Chest: Effort normal. No respiratory distress.  Abdominal: Soft. He exhibits no distension.  Musculoskeletal: Normal range of motion.  Neurological: He is alert.  Skin: Skin is warm and dry.  Nursing note and vitals reviewed.    ED Treatments / Results  Labs (all labs ordered are listed, but only abnormal results are displayed) Labs Reviewed - No data to display  EKG  EKG Interpretation None       Radiology No results found.  Procedures Procedures (including critical care time)  Medications Ordered in ED Medications - No data to display   Initial Impression / Assessment and Plan / ED Course  I have reviewed the triage vital signs and the nursing notes.  Pertinent labs & imaging results that were available during my care of the patient were reviewed by me and considered in my medical decision making (see chart for  details).  Clinical Course     65 yo w/ a couple weeks of worsening frequency and intensity of substernal chest pressure associated with sob. No h/o same. Today had an episode that improved slowly but didn't resolve until after NTG. Concern for unstable angina so will admit. First troponin negative, so no heparin and consult to medicine.   Final Clinical Impressions(s) / ED Diagnoses   Final diagnoses:  Unstable angina (HCC)  Nonspecific chest pain    New Prescriptions New Prescriptions   No medications on file     Merrily Pew, MD 07/12/16 [phone]

## 2016-07-12 NOTE — ED Notes (Signed)
Pt at xray

## 2016-07-12 NOTE — ED Notes (Signed)
Report attempted 

## 2016-07-12 NOTE — H&P (Signed)
History and Physical    Phillip [name] [id number] DOB: 05-31-1951 DOA: 07/12/2016  Referring MD/NP/PA: Dr. 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 Patient described this as chest pressure and substernal area, mostly intermittent in nature, 6/10 in severity when present, occasionally but not consistently radiating to admit the jaw area and right arm area. Patient reports noticing pain came initially with exertion, this afternoon pain came at rest and fairly quickly resolved. Patient reports being active, breaches in his church, no alcohol or tobacco use.  ED Course: In ED, pt is hemodynamically stable, VSS, blood work unremarkable, TRH asked to admit for chest pain rule out.   Review of Systems:  Constitutional: Negative for fever, chills, diaphoresis, activity change, appetite change and fatigue.  HENT: Negative for ear pain, nosebleeds, congestion, facial swelling, rhinorrhea, neck pain, neck stiffness and ear discharge.   Eyes: Negative for pain, discharge, redness, itching and visual disturbance.  Respiratory: Negative for cough, choking, chest tightness, shortness of breath, wheezing and stridor.   Cardiovascular: Per HPI Gastrointestinal: Negative for abdominal distention.  Genitourinary: Negative for dysuria, urgency, frequency, hematuria, flank pain, decreased urine volume, difficulty urinating and dyspareunia.  Musculoskeletal: Negative for back pain, joint swelling, arthralgias and gait problem.  Neurological: Negative  for dizziness, tremors, seizures, syncope, facial asymmetry, speech difficulty, weakness, light-headedness, numbness and headaches.  Hematological: Negative for adenopathy. Does not bruise/bleed easily.  Psychiatric/Behavioral: Negative for hallucinations, behavioral problems, confusion, dysphoric mood, decreased concentration and agitation.   Past Medical History:  Diagnosis Date  . Acute bronchitis 10/11/2015  . Allergic rhinitis   . Arthritis   . Colon polyps    Severe dysplasia in a right-sided polyp removed surgically in [place], 2006; final pathology showed this was not a cancer; follow up colonoscopy at one year interval was normal  . Erectile dysfunction   . GERD (gastroesophageal reflux disease)    H/o Barrett's esophagus, orginally diagnosed in 2006, biopsies showed no dysplasia; follow up EGD also showed no dysplasia  . History of BPH   . Lyme disease    x2  . Prostate infection     Past Surgical History:  Procedure Laterality Date  . COLONOSCOPY  12/2008   Repeat 5 years  . ESOPHAGOGASTRODUODENOSCOPY  12/2008   Barrett's, repeat 3 years  . HEMICOLECTOMY  2006   Right, for severely dysplastic right colon polyp  . HERNIA REPAIR  [id number]   UMBILICAL  . LUMBAR DISC SURGERY  1992   L4-5  . TONSILLECTOMY     As a child  . TUMOR REMOVAL  2002   Fatty tumor (neck)  . UPPER GASTROINTESTINAL ENDOSCOPY    . VARICOSE VEIN SURGERY  1997   Removal, right   Social Hx:  reports that he has never smoked. He has never used smokeless tobacco. He reports that he does not drink alcohol or use drugs.  Allergies  Allergen Reactions  . Penicillins     REACTION: Rash as a child - Tolerated AMOXICILLIN SEVERAL TIMES AS ADULT RECENTLY  Family History  Problem Relation Age of Onset  . Hypertension Father   . Diabetes Father   . Heart disease Father   . Lymphoma Mother   . Alcohol abuse Paternal Grandfather     Prior to Admission medications   Medication Sig Start Date End  Date Taking? Authorizing Provider  albuterol (PROVENTIL HFA;VENTOLIN HFA) 108 (90 Base) MCG/ACT inhaler Inhale 2 puffs into the lungs every 6 (six) hours as needed for wheezing or shortness of breath. 10/11/15   Mosie Lukes, MD  aspirin 81 MG tablet Take 81 mg by mouth daily.    Historical Provider, MD  dicyclomine (BENTYL) 20 MG tablet Take 1 tablet (20 mg total) by mouth 2 (two) times daily as needed (cramps). 11/03/15   Owens Loffler, MD  doxycycline (VIBRA-TABS) 100 MG tablet Take 1 tablet (100 mg total) by mouth 2 (two) times daily. 10/11/15   Mosie Lukes, MD  folic acid (FOLVITE) 1 MG tablet Take 0.8 mg by mouth daily.     Historical Provider, MD  HYDROcodone-homatropine (HYCODAN) 5-1.5 MG/5ML syrup Take 5 mLs by mouth every 8 (eight) hours as needed for cough. 10/11/15   Mosie Lukes, MD  meloxicam (MOBIC) 7.5 MG tablet Take 1 tablet (7.5 mg total) by mouth daily as needed. 07/28/15   Owens Loffler, MD  naproxen (NAPROSYN) 250 MG tablet Take 250 mg by mouth as needed.    Historical Provider, MD  omeprazole (PRILOSEC) 40 MG capsule Take 40 mg by mouth daily.     Historical Provider, MD  ondansetron (ZOFRAN ODT) 4 MG disintegrating tablet 4mg  ODT q4 hours prn nausea/vomit 11/03/15   Owens Loffler, MD  saw palmetto 80 MG capsule Take 80 mg by mouth daily.     Historical Provider, MD    Physical Exam: Vitals:   07/12/16 1445 07/12/16 1525 07/12/16 1526 07/12/16 1541  BP: 128/75 130/82  126/78  Pulse: 64 61 (!) 58 60  Resp: 16  15 15   Temp:      TempSrc:      SpO2: 100% 100% 100% 100%  Height:        Constitutional: NAD, calm, comfortable Vitals:   07/12/16 1445 07/12/16 1525 07/12/16 1526 07/12/16 1541  BP: 128/75 130/82  126/78  Pulse: 64 61 (!) 58 60  Resp: 16  15 15   Temp:      TempSrc:      SpO2: 100% 100% 100% 100%  Height:       Eyes: PERRL, lids and conjunctivae normal ENMT: Mucous membranes are moist. Posterior pharynx clear of any exudate or lesions.Normal  dentition.  Neck: normal, supple, no masses, no thyromegaly Respiratory: clear to auscultation bilaterally, no wheezing, no crackles. Normal respiratory effort. No accessory muscle use.  Cardiovascular: Regular rate and rhythm, no murmurs / rubs / gallops. No extremity edema. 2+ pedal pulses. No carotid bruits.  Abdomen: Mild tenderness in right upper quadrant area, no masses palpated. No hepatosplenomegaly. Bowel sounds positive.  Musculoskeletal: no clubbing / cyanosis. No joint deformity upper and lower extremities. Good ROM, no contractures. Normal muscle tone.  Skin: no rashes, lesions, ulcers. No induration Neurologic: CN 2-12 grossly intact. Sensation intact, DTR normal. Strength 5/5 in all 4.  Psychiatric: Normal judgment and insight. Alert and oriented x 3. Normal mood.    Labs on Admission: I have personally reviewed following labs and imaging studies  CBC:  Recent Labs Lab 07/12/16 1500  WBC 4.7  HGB 13.2  HCT 41.9  MCV 82.6  PLT [id number]*   Basic Metabolic Panel:  Recent Labs Lab 07/12/16 1500  NA 137  K 4.6  CL 106  CO2 24  GLUCOSE 109*  BUN 15  CREATININE 1.35*  CALCIUM 9.1   Liver Function Tests:  Recent Labs Lab 07/12/16 1500  AST 24  ALT 18  ALKPHOS 107  BILITOT 0.6  PROT 6.7  ALBUMIN 3.9   Cardiac Enzymes : Recent Labs Lab 07/12/16 1500  TROPONINI <0.03    Radiological Exams on Admission: Dg Chest 2 View  Result Date: 07/12/2016 CLINICAL DATA:  Chest discomfort over last several days, worsening today. EXAM: CHEST  2 VIEW COMPARISON:  None. FINDINGS: The heart size and mediastinal contours are within normal limits. Both lungs are clear. The visualized skeletal structures are unremarkable. IMPRESSION: No active cardiopulmonary disease. Electronically Signed   By: Rolm Baptise M.D.   On: 07/12/2016 15:14    EKG: pending  Assessment/Plan Active Problems:   Chest pain - unclear etiology but certainly with some worrisome, typical and  atypical symptoms - Admit to telemetry unit for observation - I have discussed with cardiologist on-call, agreeable with stressed Myoview test  - We'll make patient nothing by mouth after midnight for anticipated stress test in the morning - Overnight continue to cycle cardiac enzymes - Echocardiogram also requested - Provide analgesia as needed - Check TSH    Acute kidney injury - Unclear etiology, patient reports not eating much over the past day with no specific reason - We'll place on IV fluids 75 mL an hour - BMP in the morning    History of cholelithiasis - Mild right upper quadrant discomfort on exam noted - We'll ask for right upper quadrant ultrasound    Obesity - Body mass index is 29.83 kg/m.  DVT prophylaxis: Lovenox SQ Code Status: Full  Family Communication: Pt and wife updated at bedside Disposition Plan: Home  Consults called: None Admission status: Observation   Faye Ramsay MD [facility] Pager [phone]  If 7PM-7AM, please contact night-coverage [email or web address] Password [email or web address]  07/12/2016, 4:31 PM

## 2016-07-12 NOTE — ED Notes (Signed)
2nd Report attempted

## 2016-07-12 NOTE — Telephone Encounter (Signed)
Spoke with [name]. Phillip [name] is in the process of being transported to the ER by EMS.

## 2016-07-12 NOTE — Telephone Encounter (Signed)
Patient Name: Phillip [name]  DOB: 12-07-1950    Initial Comment Caller says, husband has chest pains on / off for a few days, with pressure    Nurse Assessment  Nurse: Verlin Fester RN, Stanton Kidney Date/Time (Eastern Time): 07/12/2016 1:32:50 PM  Confirm and document reason for call. If symptomatic, describe symptoms. You must click the next button to save text entered. ---Patient states he is having chest pain off and on with pressure.  Does the patient have any new or worsening symptoms? ---Yes  Will a triage be completed? ---Yes  Related visit to physician within the last 2 weeks? ---No  Does the PT have any chronic conditions? (i.e. diabetes, asthma, etc.) ---Yes  List chronic conditions. ---"Barrett's esophagus"  Is this a behavioral health or substance abuse call? 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 Encouraged him to go in the ED if he doesn't want to call 911   Referrals  GO TO FACILITY OTHER - SPECIFY   Disagree/Comply: Disagree  Disagree/Comply Reason: Disagree with instructions

## 2016-07-12 NOTE — Telephone Encounter (Signed)
He should go to ER. Chest pain is not something to play around with.

## 2016-07-12 NOTE — ED Triage Notes (Signed)
Pt has had intermittent episodes of chest pain that has been on going for the past 4 days, pt today states that today he had three episodes

## 2016-07-12 NOTE — ED Notes (Signed)
Patient transported to X-ray 

## 2016-07-12 NOTE — ED Triage Notes (Signed)
324mg  ASA given en route and 1 Nitro

## 2016-07-12 NOTE — Telephone Encounter (Signed)
I called contact # and spoke with Mrs Cleto; pt is at EMT station now getting checked out and pt will decide what to do after EMT advises.

## 2016-07-12 NOTE — ED Notes (Signed)
Placed patient into a gown on the monitor did ekg shown to Dr Dayna Barker

## 2016-07-13 ENCOUNTER — Observation Stay (HOSPITAL_BASED_OUTPATIENT_CLINIC_OR_DEPARTMENT_OTHER): Payer: Medicare Other

## 2016-07-13 ENCOUNTER — Observation Stay (HOSPITAL_COMMUNITY): Payer: Medicare Other

## 2016-07-13 ENCOUNTER — Encounter (HOSPITAL_COMMUNITY): Payer: Self-pay | Admitting: Nurse Practitioner

## 2016-07-13 DIAGNOSIS — I200 Unstable angina: Secondary | ICD-10-CM

## 2016-07-13 DIAGNOSIS — K8020 Calculus of gallbladder without cholecystitis without obstruction: Secondary | ICD-10-CM | POA: Diagnosis not present

## 2016-07-13 DIAGNOSIS — N183 Chronic kidney disease, stage 3 unspecified: Secondary | ICD-10-CM

## 2016-07-13 DIAGNOSIS — R079 Chest pain, unspecified: Secondary | ICD-10-CM | POA: Diagnosis not present

## 2016-07-13 DIAGNOSIS — I209 Angina pectoris, unspecified: Secondary | ICD-10-CM | POA: Diagnosis not present

## 2016-07-13 DIAGNOSIS — D696 Thrombocytopenia, unspecified: Secondary | ICD-10-CM | POA: Diagnosis not present

## 2016-07-13 LAB — NM MYOCAR MULTI W/SPECT W/WALL MOTION / EF
CHL CUP MPHR: 141 {beats}/min
CHL CUP NUCLEAR SDS: 3
CHL CUP NUCLEAR SSS: 13
CHL CUP RESTING HR STRESS: 60 {beats}/min
CHL CUP STRESS STAGE 1 HR: 77 {beats}/min
CHL CUP STRESS STAGE 1 SPEED: 0 mph
CHL CUP STRESS STAGE 2 HR: 77 {beats}/min
CHL CUP STRESS STAGE 2 SPEED: 0 mph
CHL CUP STRESS STAGE 3 SPEED: 1.7 mph
CHL CUP STRESS STAGE 4 SBP: 170 mmHg
CHL CUP STRESS STAGE 4 SPEED: 2.5 mph
CHL CUP STRESS STAGE 5 GRADE: 0 %
CHL CUP STRESS STAGE 5 HR: 129 {beats}/min
CHL CUP STRESS STAGE 6 DBP: 76 mmHg
CHL CUP STRESS STAGE 6 GRADE: 0 %
CHL CUP STRESS STAGE 6 HR: 93 {beats}/min
CHL CUP STRESS STAGE 6 SBP: 171 mmHg
CSEPED: 7 min
CSEPEDS: 28 s
CSEPEW: 7 METS
CSEPHR: 90 %
CSEPPMHR: 89 %
LHR: 0.25
LV dias vol: 90 mL (ref 62–150)
LV sys vol: 40 mL
Peak BP: 170 mmHg
Peak HR: 139 {beats}/min
SRS: 10
Stage 1 Grade: 0 %
Stage 2 Grade: 0 %
Stage 3 Grade: 10 %
Stage 3 HR: 100 {beats}/min
Stage 4 DBP: 69 mmHg
Stage 4 Grade: 12 %
Stage 4 HR: 139 {beats}/min
Stage 5 DBP: 75 mmHg
Stage 5 SBP: 173 mmHg
Stage 5 Speed: 0 mph
Stage 6 Speed: 0 mph
TID: 1.64

## 2016-07-13 LAB — ECHOCARDIOGRAM COMPLETE
CHL CUP MV DEC (S): 352
CHL CUP TV REG PEAK VELOCITY: 261 cm/s
EERAT: 7.49
EWDT: 352 ms
FS: 34 % (ref 28–44)
Height: 73 in
IVS/LV PW RATIO, ED: 0.91
LADIAMINDEX: 1.89 cm/m2
LASIZE: 44 mm
LAVOL: 60.2 mL
LAVOLA4C: 51.2 mL
LAVOLIN: 25.9 mL/m2
LEFT ATRIUM END SYS DIAM: 44 mm
LV PW d: 10.2 mm — AB (ref 0.6–1.1)
LV TDI E'MEDIAL: 7.2
LVEEAVG: 7.49
LVEEMED: 7.49
LVELAT: 8.22 cm/s
LVOT area: 3.46 cm2
LVOT diameter: 21 mm
Lateral S' vel: 13 cm/s
MV pk E vel: 61.6 m/s
MVPKAVEL: 55.8 m/s
RV TAPSE: 23.5 mm
RV sys press: 30 mmHg
TDI e' lateral: 8.22
TRMAXVEL: 261 cm/s
Weight: 3625.6 oz

## 2016-07-13 LAB — BASIC METABOLIC PANEL
ANION GAP: 7 (ref 5–15)
BUN: 13 mg/dL (ref 6–20)
CHLORIDE: 108 mmol/L (ref 101–111)
CO2: 25 mmol/L (ref 22–32)
Calcium: 8.5 mg/dL — ABNORMAL LOW (ref 8.9–10.3)
Creatinine, Ser: 1.19 mg/dL (ref 0.61–1.24)
GFR calc non Af Amer: >60 mL/min (ref >60)
Glucose, Bld: 106 mg/dL — ABNORMAL HIGH (ref 65–99)
POTASSIUM: 3.6 mmol/L (ref 3.5–5.1)
Sodium: 140 mmol/L (ref 135–145)

## 2016-07-13 LAB — CBC
HEMATOCRIT: 38.1 % — AB (ref 39.0–52.0)
HEMOGLOBIN: 12.1 g/dL — AB (ref 13.0–17.0)
MCH: 26.2 pg (ref 26.0–34.0)
MCHC: 31.8 g/dL (ref 30.0–36.0)
MCV: 82.5 fL (ref 78.0–100.0)
Platelets: 109 10*3/uL — ABNORMAL LOW (ref 150–400)
RBC: 4.62 MIL/uL (ref 4.22–5.81)
RDW: 17.1 % — ABNORMAL HIGH (ref 11.5–15.5)
WBC: 3.7 10*3/uL — AB (ref 4.0–10.5)

## 2016-07-13 LAB — TROPONIN I: Troponin I: <0.03 ng/mL (ref <0.03)

## 2016-07-13 MED ORDER — TECHNETIUM TC 99M TETROFOSMIN IV KIT
30.0000 | PACK | Freq: Once | INTRAVENOUS | Status: AC | PRN
  Administered 2016-07-13: 30 via INTRAVENOUS

## 2016-07-13 MED ORDER — SODIUM CHLORIDE 0.9 % WEIGHT BASED INFUSION
3.0000 mL/kg/h | INTRAVENOUS | Status: DC
  Administered 2016-07-14: 3 mL/kg/h via INTRAVENOUS

## 2016-07-13 MED ORDER — SODIUM CHLORIDE 0.9% FLUSH: 3.0000 mL | INTRAVENOUS | Status: DC | PRN

## 2016-07-13 MED ORDER — SODIUM CHLORIDE 0.9 % WEIGHT BASED INFUSION
1.0000 mL/kg/h | INTRAVENOUS | Status: DC
  Administered 2016-07-14: 1 mL/kg/h via INTRAVENOUS

## 2016-07-13 MED ORDER — SODIUM CHLORIDE 0.9 % IV SOLN: 250.0000 mL | INTRAVENOUS | Status: DC | PRN

## 2016-07-13 MED ORDER — SODIUM CHLORIDE 0.9% FLUSH: 3.0000 mL | Freq: Two times a day (BID) | INTRAVENOUS | Status: DC

## 2016-07-13 MED ORDER — TECHNETIUM TC 99M TETROFOSMIN IV KIT
10.0000 | PACK | Freq: Once | INTRAVENOUS | Status: AC | PRN
  Administered 2016-07-13: 10 via INTRAVENOUS

## 2016-07-13 NOTE — Progress Notes (Signed)
  Echocardiogram 2D Echocardiogram has been performed.  Phillip [name] 07/13/2016, 10:57 AM

## 2016-07-13 NOTE — Progress Notes (Signed)
   Tad Moore presented for an exercise cardiolite today.  No immediate complications.  Stress imaging is pending at this time.  Murray Hodgkins, NP 07/13/2016, 9:17 AM

## 2016-07-13 NOTE — Consult Note (Signed)
Cardiology Consult    Patient ID: Phillip [name] MRN: [id number], DOB/AGE: [birth year]   Admit date: 07/12/2016 Date of Consult: 07/13/2016  Primary Physician: Owens Loffler, MD Primary Cardiologist: New  Requesting Provider: D. Tat  Patient Profile    65 y/o ? without a prior cardiac hx who was admitted 11/27 with chest pain, r/o, and had an intermediate nuclear stress test.  Past Medical History   Past Medical History:  Diagnosis Date  . Acute bronchitis 10/11/2015  . Allergic rhinitis   . Arthritis   . Chest pain    a. 06/2016 Echo: EF 55-60%, no rwma, mild AI, triv MR/TR/PR, lipomatous hypertrophy of atrial septum;  b. 06/2016 Ex MV: EF 56%, large, partially reversible anteroseptal, inferoseptal, inferior,and apical perfusion defect - borderline for ischemia (TID 1.64) - equivocal study.  . Colon polyps    a. Severe dysplasia in a right-sided polyp removed surgically in [place], 2006--> final pathology showed this was not a cancer.  Follow up colonoscopy at one year interval was normal.  . Erectile dysfunction   . GERD (gastroesophageal reflux disease)    H/o Barrett's esophagus, orginally diagnosed in 2006, biopsies showed no dysplasia; follow up EGD also showed no dysplasia  . History of BPH   . Lyme disease    x2  . Prostate infection     Past Surgical History:  Procedure Laterality Date  . COLONOSCOPY  12/2008   Repeat 5 years  . ESOPHAGOGASTRODUODENOSCOPY  12/2008   Barrett's, repeat 3 years  . HEMICOLECTOMY  2006   Right, for severely dysplastic right colon polyp  . HERNIA REPAIR  [id number]   UMBILICAL  . LUMBAR DISC SURGERY  1992   L4-5  . TONSILLECTOMY     As a child  . TUMOR REMOVAL  2002   Fatty tumor (neck)  . UPPER GASTROINTESTINAL ENDOSCOPY    . VARICOSE VEIN SURGERY  1997   Removal, right     Allergies  Allergies  Allergen Reactions  . Penicillins     REACTION: Rash as a child - Tolerated AMOXICILLIN SEVERAL TIMES AS ADULT RECENTLY Has  patient had a PCN reaction causing immediate rash, facial/tongue/throat swelling, SOB or lightheadedness with hypotension: YES Has patient had a PCN reaction causing severe rash involving mucus membranes or skin necrosis:NO Has patient had a PCN reaction that required hospitalization NO Has patient had a PCN reaction occurring within the last 10 years: NO If all of the above answers are "NO", then may proceed with     History of Present Illness    65 y/o ? w/o prior cardiac history.  Other hx includes BPH, GERD, and ED.  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  Since then, he has noted generalized fatigue/low energy.  On the day prior to Thanksgiving, he had recurrent chest pressure after eating a large meal.  This was w/o associated Ss, lasted only a few mins and resolved spontaneously.  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  On 11/27, he again developed exertional c/p that resolved with rest.  Because of recurrent Ss, he presented to his local [facility] and EMS was called.  He was admitted to [facility] on 11/27 and r/o for MI.  He continued to report mild chest discomfort.  He underwent exercise  stress testing this AM, walking for 7:28.  He did have slight worsening of his baseline level of discomfort but did not have any ECG changes.  Imaging revealed a large defect involving the basal inferoseptal, mid anteroseptal, mid inferoseptal, apical septal, apical inferior, and apical walls.  This was felt to be partially reversible.  As a result, we've been asked to eval.    Inpatient Medications    . aspirin  81 mg Oral Daily  . enoxaparin (LOVENOX) injection  40 mg Subcutaneous Q24H  . pantoprazole  40 mg Oral Daily    Family History    Family History  Problem Relation Age of Onset  . Hypertension Father   . Diabetes  Father   . Heart disease Father   . Lymphoma Mother   . Alcohol abuse Paternal Grandfather     Social History    Social History   Social History  . Marital status: Married    Spouse name: N/A  . Number of children: 3  . Years of education: N/A   Occupational History  . [employer], [place]   Social History Main Topics  . Smoking status: Never Smoker  . Smokeless tobacco: Never Used  . Alcohol use No  . Drug use: No  . Sexual activity: Not on file   Other Topics Concern  . Not on file   Social History Narrative   Lives in [place] with his wife.  Tax inspector.        Review of Systems    General:  No chills, fever, night sweats or weight changes.  Cardiovascular:  +++ chest pain, no dyspnea on exertion, edema, orthopnea, palpitations, paroxysmal nocturnal dyspnea. Dermatological: No rash, lesions/masses Respiratory: No cough, dyspnea Urologic: No hematuria, dysuria Abdominal:   No nausea, vomiting, diarrhea, bright red blood per rectum, melena, or hematemesis Neurologic:  No visual changes, wkns, changes in mental status. All other systems reviewed and are otherwise negative except as noted above.  Physical Exam    Blood pressure 123/80, pulse 60, temperature 98 F (36.7 C), temperature source Oral, resp. rate 18, height 6\' 1"  (1.854 m), weight 226 lb 9.6 oz (102.8 kg), SpO2 100 %.  General: Pleasant, NAD Psych: Normal affect. Neuro: Alert and oriented X 3. Moves all extremities spontaneously. HEENT: Normal  Neck: Supple without bruits or JVD. Lungs:  Resp regular and unlabored, CTA. Heart: RRR no s3, s4, or murmurs. Abdomen: Soft, non-tender, non-distended, BS + x 4.  Extremities: No clubbing, cyanosis or edema. DP/PT/Radials 2+ and equal bilaterally.  Labs    Troponin [facility] Test)  Recent Labs  07/12/16 1505  TROPIPOC 0.00    Recent Labs  07/12/16 1500 07/12/16 1817 07/12/16 2154  07/13/16 0013  TROPONINI <0.03 <0.03 0.03* <0.03   Lab Results  Component Value Date   WBC 3.7 (L) 07/13/2016   HGB 12.1 (L) 07/13/2016   HCT 38.1 (L) 07/13/2016   MCV 82.5 07/13/2016   PLT 109 (L) 07/13/2016    Recent Labs Lab 07/12/16 1500  07/13/16 0013  NA 137  --  140  K 4.6  --  3.6  CL 106  --  108  CO2 24  --  25  BUN 15  --  13  CREATININE 1.35*  < > 1.19  CALCIUM 9.1  --  8.5*  PROT 6.7  --   --   BILITOT 0.6  --   --   [facility]  107  --   --   ALT 18  --   --   AST 24  --   --   GLUCOSE 109*  --  106*  < > = values in this interval not displayed. Lab Results  Component Value Date   CHOL 105 07/21/2015   HDL 25.10 (L) 07/21/2015   LDLCALC 56 07/21/2015   TRIG 121.0 07/21/2015   Lab Results  Component Value Date   DDIMER 0.40 07/12/2016     Radiology Studies    Dg Chest 2 View  Result Date: 07/12/2016 CLINICAL DATA:  Chest discomfort over last several days, worsening today. EXAM: CHEST  2 VIEW COMPARISON:  None. FINDINGS: The heart size and mediastinal contours are within normal limits. Both lungs are clear. The visualized skeletal structures are unremarkable. IMPRESSION: No active cardiopulmonary disease. Electronically Signed   By: Rolm Baptise M.D.   On: 07/12/2016 15:14   Nm Myocar Multi W/spect W/wall Motion / Ef  Result Date: 07/13/2016  Blood pressure demonstrated a hypertensive response to exercise.  There was no ST segment deviation noted during stress.  No T wave inversion was noted during stress.  Defect 1: There is a large defect of moderate severity.  This is a low risk study.  Nuclear stress EF: 56%.  Large size, partially reversible (SDS 3) anteroseptal, inferoseptal, inferior and apical perfusion defect which is borderline for ischemia. The TID ratio, however, is elevated at 1.64, which can sometimes be associated with proximal left main or multivessel CAD.  LVEF 56% with normal wall motion. This is an equivocal study. Clinical  correlation is advised and further testing may be indicated.   US Abdomen Limited Ruq  Result Date: 07/13/2016 CLINICAL DATA:  Six days of chest pain.  Known gallstones. EXAM: US ABDOMEN LIMITED - RIGHT UPPER QUADRANT COMPARISON:  Abdominal and pelvic CT scan of April 20, 2012 FINDINGS: Gallbladder: The gallbladder is adequately distended. An 8 mm stone is observed near the gallbladder neck. There is no gallbladder wall thickening, pericholecystic fluid, or positive sonographic Murphy's sign. Common bile duct: Diameter: 2.5 mm Liver: The hepatic echotexture is heterogeneously increased. There is no focal mass nor ductal dilation. The surface contour of the liver is normal. IMPRESSION: Gallstones without sonographic evidence of acute cholecystitis. Increased hepatic echotexture most compatible with fatty infiltrative change. Electronically Signed   By: David  Martinique M.D.   On: 07/13/2016 09:47    ECG & Cardiac Imaging    RSR, 70, no acute ST/T changes.  Assessment & Plan    1.  [place]:  Pt presented with a several wk h/o intermittent exertional c/p.  He was admitted 11/27 after an episode that was worse than others and lasted longer (~ 10 mins).  He r/o for MI and underwent stress testing this AM, which showed a large perfusion defect and possible ischemia.  The TID ratio was elevated @ 1.64.  In that setting, we've been asked to eval.  He still reports very mild chest discomfort that is not reproducible with position changes, deep breathing, or palpation.  We discussed the role of diagnostic catheterizaton in the setting of his Ss and abnl nuc study.  The patient understands that risks include but are not limited to stroke (1 in 1000), death (1 in 57), kidney failure [usually temporary] (1 in 500), bleeding (1 in 200), allergic reaction [possibly serious] (1 in 200), and agrees to proceed.  We will schedule him for tomorrow morning.  Cont asa  Signed,  Murray Hodgkins, NP 07/13/2016, 3:43 PM    I have personally seen and examined this patient with Ignacia Bayley, NP. I agree with the assessment and plan as outlined above. He is admitted with classic symptoms of unstable angina. Stress test is intermediate risk. My exam shows a WDWN male in NAD. CV:RRR. Lungs:clear bilaterally. Ext: no edema. Labs reviewed.  EKG without ischemic changes.  Plan cardiac cath tomorrow with possible PCI. NPO at midnight. Risks and benefits reviewed with pt.   Lauree Chandler 07/13/2016 4:01 PM

## 2016-07-13 NOTE — Care Management Obs Status (Signed)
[place] NOTIFICATION   Patient Details  Name: Phillip [name] MRN: [id number] Date of Birth: [birth year]   Medicare Observation Status Notification Given:  Yes (explained letter to patient, answered patient's questions)    Apolonio Schneiders, RN 07/13/2016, 6:55 PM

## 2016-07-13 NOTE — Progress Notes (Addendum)
PROGRESS NOTE  Phillip [name] [id number] DOB: 1951-06-06 DOA: 07/12/2016 PCP: Owens Loffler, MD  Brief History:  65 year old male with history of GERD and Barrett's esophagus presented with 2-3 week history of intermittent chest discomfort. 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 Since that period of time, the patient has had intermittent chest discomfort with exertion as well as at rest. On 07/12/2016, the patient experienced unrelenting chest discomfort while painting his house. He states that his chest discomfort intermittently radiated to his jaw. As a result, the patient presents emergency department for further evaluation. He states that his father died from an MI at age 50. Troponins have been negative, and d-dimer was unremarkable. Myoview stress test was ordered.   Assessment/Plan: Angina -await results of stress test -if abnormal stress test-->cardiology consult -check lipids -check a1c -continue ASA -Echo--EF 55-60%, no WMA, trivial MR/TR  CKD stage II-III  -Baseline creatinine 1.2-1.5   Thrombocytopenia  -Chronic dating back to 12/02/2008  -Serum B12  -Review of the medical record also shows that he has had intermittent leukopenia  -May need hematology evaluation in the outpatient setting    Disposition Plan:   Home if unremarkable stress test Family Communication:   No Family at bedside  Consultants:  none  Code Status:  FULL  DVT Prophylaxis:  [place] Lovenox   Procedures: As Listed in Progress Note Above  Antibiotics: None    Subjective: Patient denies fevers, chills, headache, chest pain, dyspnea, nausea, vomiting, diarrhea, abdominal pain, dysuria, hematuria, hematochezia, and melena.   Objective: Vitals:   07/13/16 0859 07/13/16 0919 07/13/16 0922 07/13/16 0923  BP: 136/81 (!) 211/73  (!) 170/69  Pulse: (!) 59 96 (!) 115  (!) 124  Resp:      Temp:      TempSrc:      SpO2:      Weight:      Height:        Intake/Output Summary (Last 24 hours) at 07/13/16 1107 Last data filed at 07/13/16 0600  Gross per 24 hour  Intake          1018.75 ml  Output              970 ml  Net            48.75 ml   Weight change:  Exam:   General:  Pt is alert, follows commands appropriately, not in acute distress  HEENT: No icterus, No thrush, No neck mass, [place]/AT  Cardiovascular: RRR, S1/S2, no rubs, no gallops  Respiratory: CTA bilaterally, no wheezing, no crackles, no rhonchi  Abdomen: Soft/+BS, non tender, non distended, no guarding  Extremities: 1 + LE edema, No lymphangitis, No petechiae, No rashes, no synovitis   Data Reviewed: I have personally reviewed following labs and imaging studies Basic Metabolic Panel:  Recent Labs Lab 07/12/16 1500 07/12/16 1817 07/13/16 0013  NA 137  --  140  K 4.6  --  3.6  CL 106  --  108  CO2 24  --  25  GLUCOSE 109*  --  106*  BUN 15  --  13  CREATININE 1.35* 1.28* 1.19  CALCIUM 9.1  --  8.5*   Liver Function Tests:  Recent Labs Lab 07/12/16 1500  AST 24  ALT 18  ALKPHOS 107  BILITOT 0.6  PROT 6.7  ALBUMIN 3.9  No results for input(s): LIPASE, AMYLASE in the last 168 hours. No results for input(s): AMMONIA in the last 168 hours. Coagulation Profile: No results for input(s): INR, PROTIME in the last 168 hours. CBC:  Recent Labs Lab 07/12/16 1500 07/12/16 1817 07/13/16 0013  WBC 4.7 4.5 3.7*  HGB 13.2 13.1 12.1*  HCT 41.9 41.0 38.1*  MCV 82.6 82.8 82.5  PLT 136* 122* 109*   Cardiac Enzymes:  Recent Labs Lab 07/12/16 1500 07/12/16 1817 07/12/16 2154 07/13/16 0013  TROPONINI <0.03 <0.03 0.03* <0.03   BNP: Invalid input(s): POCBNP CBG: No results for input(s): GLUCAP in the last 168 hours. HbA1C: No results for input(s): HGBA1C in the last 72 hours. Urine analysis:    Component Value Date/Time   COLORURINE AMBER (A) 05/22/2013  0155   APPEARANCEUR TURBID (A) 05/22/2013 0155   LABSPEC 1.030 05/22/2013 0155   PHURINE 5.0 05/22/2013 0155   GLUCOSEU NEGATIVE 05/22/2013 0155   HGBUR NEGATIVE 05/22/2013 0155   BILIRUBINUR SMALL (A) 05/22/2013 0155   KETONESUR 15 (A) 05/22/2013 0155   PROTEINUR 100 (A) 05/22/2013 0155   UROBILINOGEN 0.2 05/22/2013 0155   NITRITE NEGATIVE 05/22/2013 0155   LEUKOCYTESUR NEGATIVE 05/22/2013 0155   Sepsis Labs: @LABRCNTIP (procalcitonin:4,lacticidven:4) )No results found for this or any previous visit (from the past 240 hour(s)).   Scheduled Meds: . aspirin  81 mg Oral Daily  . enoxaparin (LOVENOX) injection  40 mg Subcutaneous Q24H  . pantoprazole  40 mg Oral Daily   Continuous Infusions: . sodium chloride 75 mL/hr at 07/12/16 1825    Procedures/Studies: Dg Chest 2 View  Result Date: 07/12/2016 CLINICAL DATA:  Chest discomfort over last several days, worsening today. EXAM: CHEST  2 VIEW COMPARISON:  None. FINDINGS: The heart size and mediastinal contours are within normal limits. Both lungs are clear. The visualized skeletal structures are unremarkable. IMPRESSION: No active cardiopulmonary disease. Electronically Signed   By: Rolm Baptise M.D.   On: 07/12/2016 15:14   US Abdomen Limited Ruq  Result Date: 07/13/2016 CLINICAL DATA:  Six days of chest pain.  Known gallstones. EXAM: US ABDOMEN LIMITED - RIGHT UPPER QUADRANT COMPARISON:  Abdominal and pelvic CT scan of April 20, 2012 FINDINGS: Gallbladder: The gallbladder is adequately distended. An 8 mm stone is observed near the gallbladder neck. There is no gallbladder wall thickening, pericholecystic fluid, or positive sonographic Murphy's sign. Common bile duct: Diameter: 2.5 mm Liver: The hepatic echotexture is heterogeneously increased. There is no focal mass nor ductal dilation. The surface contour of the liver is normal. IMPRESSION: Gallstones without sonographic evidence of acute cholecystitis. Increased hepatic echotexture  most compatible with fatty infiltrative change. Electronically Signed   By: Jerel Sardina  [place] M.D.   On: 07/13/2016 09:47    Madailein Londo, DO  [facility] Pager [phone]  If 7PM-7AM, please contact night-coverage [email or web address] Password [email or web address] 07/13/2016, 11:07 AM   LOS: 0 days

## 2016-07-13 NOTE — Progress Notes (Signed)
Pt and wife at bedside had stress done plan is for a cardiac cath in the am, vitals stable. Verbalizes understanding, and plan to watch video on procedure.

## 2016-07-13 NOTE — Progress Notes (Signed)
Pt alert and oriented, NAD noted.

## 2016-07-14 ENCOUNTER — Encounter (HOSPITAL_COMMUNITY)
Admission: EM | Disposition: A | Discharge status: Home / Self Care | Payer: Self-pay | Source: Home / Self Care | Attending: Internal Medicine

## 2016-07-14 DIAGNOSIS — D696 Thrombocytopenia, unspecified: Secondary | ICD-10-CM | POA: Diagnosis not present

## 2016-07-14 DIAGNOSIS — I129 Hypertensive chronic kidney disease with stage 1 through stage 4 chronic kidney disease, or unspecified chronic kidney disease: Secondary | ICD-10-CM | POA: Diagnosis present

## 2016-07-14 DIAGNOSIS — N179 Acute kidney failure, unspecified: Secondary | ICD-10-CM | POA: Diagnosis not present

## 2016-07-14 DIAGNOSIS — Z880 Allergy status to penicillin: Secondary | ICD-10-CM | POA: Diagnosis not present

## 2016-07-14 DIAGNOSIS — I209 Angina pectoris, unspecified: Secondary | ICD-10-CM

## 2016-07-14 DIAGNOSIS — Z8249 Family history of ischemic heart disease and other diseases of the circulatory system: Secondary | ICD-10-CM | POA: Diagnosis not present

## 2016-07-14 DIAGNOSIS — Z86010 Personal history of colonic polyps: Secondary | ICD-10-CM | POA: Diagnosis not present

## 2016-07-14 DIAGNOSIS — Z833 Family history of diabetes mellitus: Secondary | ICD-10-CM | POA: Diagnosis not present

## 2016-07-14 DIAGNOSIS — Z6829 Body mass index (BMI) 29.0-29.9, adult: Secondary | ICD-10-CM | POA: Diagnosis not present

## 2016-07-14 DIAGNOSIS — Z7982 Long term (current) use of aspirin: Secondary | ICD-10-CM | POA: Diagnosis not present

## 2016-07-14 DIAGNOSIS — K8020 Calculus of gallbladder without cholecystitis without obstruction: Secondary | ICD-10-CM | POA: Diagnosis present

## 2016-07-14 DIAGNOSIS — I200 Unstable angina: Secondary | ICD-10-CM | POA: Diagnosis not present

## 2016-07-14 DIAGNOSIS — D61818 Other pancytopenia: Secondary | ICD-10-CM | POA: Diagnosis not present

## 2016-07-14 DIAGNOSIS — I25110 Atherosclerotic heart disease of native coronary artery with unstable angina pectoris: Principal | ICD-10-CM

## 2016-07-14 DIAGNOSIS — R079 Chest pain, unspecified: Secondary | ICD-10-CM | POA: Diagnosis not present

## 2016-07-14 DIAGNOSIS — K219 Gastro-esophageal reflux disease without esophagitis: Secondary | ICD-10-CM | POA: Diagnosis present

## 2016-07-14 DIAGNOSIS — Z791 Long term (current) use of non-steroidal anti-inflammatories (NSAID): Secondary | ICD-10-CM | POA: Diagnosis not present

## 2016-07-14 DIAGNOSIS — N183 Chronic kidney disease, stage 3 (moderate): Secondary | ICD-10-CM | POA: Diagnosis present

## 2016-07-14 DIAGNOSIS — N400 Enlarged prostate without lower urinary tract symptoms: Secondary | ICD-10-CM | POA: Diagnosis not present

## 2016-07-14 DIAGNOSIS — Z807 Family history of other malignant neoplasms of lymphoid, hematopoietic and related tissues: Secondary | ICD-10-CM | POA: Diagnosis not present

## 2016-07-14 DIAGNOSIS — E669 Obesity, unspecified: Secondary | ICD-10-CM | POA: Diagnosis not present

## 2016-07-14 DIAGNOSIS — K760 Fatty (change of) liver, not elsewhere classified: Secondary | ICD-10-CM | POA: Diagnosis present

## 2016-07-14 HISTORY — PX: CARDIAC CATHETERIZATION: SHX172

## 2016-07-14 LAB — BASIC METABOLIC PANEL
ANION GAP: 8 (ref 5–15)
BUN: 13 mg/dL (ref 6–20)
CALCIUM: 8.4 mg/dL — AB (ref 8.9–10.3)
CO2: 24 mmol/L (ref 22–32)
CREATININE: 1.2 mg/dL (ref 0.61–1.24)
Chloride: 107 mmol/L (ref 101–111)
GFR calc Af Amer: >60 mL/min (ref >60)
GLUCOSE: 103 mg/dL — AB (ref 65–99)
Potassium: 3.8 mmol/L (ref 3.5–5.1)
Sodium: 139 mmol/L (ref 135–145)

## 2016-07-14 LAB — LIPID PANEL
CHOL/HDL RATIO: 5.7 ratio
CHOLESTEROL: 102 mg/dL (ref 0–200)
HDL: 18 mg/dL — ABNORMAL LOW (ref >40)
LDL CALC: 55 mg/dL (ref 0–99)
TRIGLYCERIDES: 147 mg/dL (ref <150)
VLDL: 29 mg/dL (ref 0–40)

## 2016-07-14 LAB — PROTIME-INR
INR: 1.13
Prothrombin Time: 14.5 seconds (ref 11.4–15.2)

## 2016-07-14 LAB — VITAMIN B12: Vitamin B-12: 203 pg/mL (ref 180–914)

## 2016-07-14 LAB — POCT ACTIVATED CLOTTING TIME: ACTIVATED CLOTTING TIME: 290 s

## 2016-07-14 SURGERY — LEFT HEART CATH AND CORONARY ANGIOGRAPHY
Anesthesia: LOCAL

## 2016-07-14 MED ORDER — LIDOCAINE HCL (PF) 1 % IJ SOLN
INTRAMUSCULAR | Status: AC
  Filled 2016-07-14: qty 30

## 2016-07-14 MED ORDER — ANGIOPLASTY BOOK
Freq: Once | Status: AC
  Administered 2016-07-14: 20:00:00
  Filled 2016-07-14: qty 1

## 2016-07-14 MED ORDER — FENTANYL CITRATE (PF) 100 MCG/2ML IJ SOLN
INTRAMUSCULAR | Status: DC | PRN
  Administered 2016-07-14: 25 ug via INTRAVENOUS
  Administered 2016-07-14 (×2): 50 ug via INTRAVENOUS

## 2016-07-14 MED ORDER — SODIUM CHLORIDE 0.9% FLUSH
3.0000 mL | Freq: Two times a day (BID) | INTRAVENOUS | Status: DC
  Administered 2016-07-14: 3 mL via INTRAVENOUS

## 2016-07-14 MED ORDER — IOPAMIDOL (ISOVUE-370) INJECTION 76%
INTRAVENOUS | Status: AC
  Filled 2016-07-14: qty 50

## 2016-07-14 MED ORDER — VERAPAMIL HCL 2.5 MG/ML IV SOLN
INTRAVENOUS | Status: DC | PRN
  Administered 2016-07-14: 16:00:00 via INTRA_ARTERIAL

## 2016-07-14 MED ORDER — SODIUM CHLORIDE 0.9% FLUSH: 3.0000 mL | INTRAVENOUS | Status: DC | PRN

## 2016-07-14 MED ORDER — HEPARIN SODIUM (PORCINE) 1000 UNIT/ML IJ SOLN
INTRAMUSCULAR | Status: AC
  Filled 2016-07-14: qty 1

## 2016-07-14 MED ORDER — MIDAZOLAM HCL 2 MG/2ML IJ SOLN
INTRAMUSCULAR | Status: AC
  Filled 2016-07-14: qty 2

## 2016-07-14 MED ORDER — HYDRALAZINE HCL 20 MG/ML IJ SOLN: 5.0000 mg | INTRAMUSCULAR | Status: AC | PRN

## 2016-07-14 MED ORDER — TICAGRELOR 90 MG PO TABS
ORAL_TABLET | ORAL | Status: AC
  Filled 2016-07-14: qty 2

## 2016-07-14 MED ORDER — LABETALOL HCL 5 MG/ML IV SOLN: 10.0000 mg | INTRAVENOUS | Status: AC | PRN

## 2016-07-14 MED ORDER — FENTANYL CITRATE (PF) 100 MCG/2ML IJ SOLN
INTRAMUSCULAR | Status: AC
Start: 2016-07-14 — End: 2016-07-14
  Filled 2016-07-14: qty 2

## 2016-07-14 MED ORDER — LIDOCAINE HCL (PF) 1 % IJ SOLN
INTRAMUSCULAR | Status: DC | PRN
  Administered 2016-07-14: 5 mL

## 2016-07-14 MED ORDER — HEPARIN (PORCINE) IN NACL 2-0.9 UNIT/ML-% IJ SOLN
INTRAMUSCULAR | Status: AC
  Filled 2016-07-14: qty 1000

## 2016-07-14 MED ORDER — NITROGLYCERIN 1 MG/10 ML FOR IR/CATH LAB
INTRA_ARTERIAL | Status: AC
  Filled 2016-07-14: qty 10

## 2016-07-14 MED ORDER — HEPARIN SODIUM (PORCINE) 1000 UNIT/ML IJ SOLN
INTRAMUSCULAR | Status: DC | PRN
  Administered 2016-07-14 (×2): 5000 [IU] via INTRAVENOUS

## 2016-07-14 MED ORDER — IOPAMIDOL (ISOVUE-370) INJECTION 76%
INTRAVENOUS | Status: AC
  Filled 2016-07-14: qty 100

## 2016-07-14 MED ORDER — LIDOCAINE IN D5W 4-5 MG/ML-% IV SOLN
INTRAVENOUS | Status: AC
  Filled 2016-07-14: qty 500

## 2016-07-14 MED ORDER — NITROGLYCERIN 1 MG/10 ML FOR IR/CATH LAB
INTRA_ARTERIAL | Status: DC | PRN
  Administered 2016-07-14 (×2): 200 ug via INTRACORONARY

## 2016-07-14 MED ORDER — MIDAZOLAM HCL 2 MG/2ML IJ SOLN
INTRAMUSCULAR | Status: DC | PRN
  Administered 2016-07-14: 1 mg via INTRAVENOUS
  Administered 2016-07-14: 2 mg via INTRAVENOUS

## 2016-07-14 MED ORDER — TICAGRELOR 90 MG PO TABS
ORAL_TABLET | ORAL | Status: DC | PRN
  Administered 2016-07-14: 180 mg via ORAL

## 2016-07-14 MED ORDER — SODIUM CHLORIDE 0.9 % WEIGHT BASED INFUSION: 1.0000 mL/kg/h | INTRAVENOUS | Status: AC

## 2016-07-14 MED ORDER — VERAPAMIL HCL 2.5 MG/ML IV SOLN
INTRAVENOUS | Status: AC
Start: 2016-07-14 — End: 2016-07-14
  Filled 2016-07-14: qty 2

## 2016-07-14 MED ORDER — TICAGRELOR 90 MG PO TABS
90.0000 mg | ORAL_TABLET | Freq: Two times a day (BID) | ORAL | Status: DC
  Administered 2016-07-15: 90 mg via ORAL
  Filled 2016-07-14: qty 1

## 2016-07-14 MED ORDER — HEPARIN (PORCINE) IN NACL 2-0.9 UNIT/ML-% IJ SOLN
INTRAMUSCULAR | Status: DC | PRN
Start: 2016-07-14 — End: 2016-07-14
  Administered 2016-07-14: 1000 mL

## 2016-07-14 MED ORDER — FENTANYL CITRATE (PF) 100 MCG/2ML IJ SOLN
INTRAMUSCULAR | Status: AC
  Filled 2016-07-14: qty 2

## 2016-07-14 MED ORDER — IOPAMIDOL (ISOVUE-370) INJECTION 76%
INTRAVENOUS | Status: DC | PRN
  Administered 2016-07-14: 135 mL via INTRA_ARTERIAL

## 2016-07-14 MED ORDER — SODIUM CHLORIDE 0.9 % IV SOLN: 250.0000 mL | INTRAVENOUS | Status: DC | PRN

## 2016-07-14 SURGICAL SUPPLY — 22 items
BALLN EMERGE MR 2.5X12 (BALLOONS) ×2
BALLN ~~LOC~~ EUPHORA RX 3.5X20 (BALLOONS) ×2
BALLOON EMERGE MR 2.5X12 (BALLOONS) ×1 IMPLANT
BALLOON ~~LOC~~ EUPHORA RX 3.5X20 (BALLOONS) ×1 IMPLANT
CATH INFINITI 5 FR JL3.5 (CATHETERS) ×2 IMPLANT
CATH INFINITI 5 FR JR5 (CATHETERS) ×2 IMPLANT
CATH INFINITI 5FR AL1 (CATHETERS) ×2 IMPLANT
CATH INFINITI 5FR ANG PIGTAIL (CATHETERS) ×2 IMPLANT
CATH INFINITI JR4 5F (CATHETERS) ×2 IMPLANT
CATH VISTA GUIDE 6FR XBLAD3.5 (CATHETERS) ×2 IMPLANT
DEVICE RAD COMP TR BAND LRG (VASCULAR PRODUCTS) ×2 IMPLANT
GLIDESHEATH INTRODUCER 6F 10CM (SHEATH) ×2 IMPLANT
GUIDEWIRE INQWIRE 1.5J.035X260 (WIRE) ×1 IMPLANT
INQWIRE 1.5J .035X260CM (WIRE) ×2
KIT ENCORE 26 ADVANTAGE (KITS) ×2 IMPLANT
KIT HEART LEFT (KITS) ×2 IMPLANT
NEEDLE PERC 21GX4CM (NEEDLE) ×2 IMPLANT
PACK CARDIAC CATHETERIZATION (CUSTOM PROCEDURE TRAY) ×2 IMPLANT
STENT PROMUS PREM MR 3.0X24 (Permanent Stent) ×2 IMPLANT
TRANSDUCER W/STOPCOCK (MISCELLANEOUS) ×2 IMPLANT
TUBING CIL FLEX 10 FLL-RA (TUBING) ×2 IMPLANT
WIRE COUGAR XT STRL 190CM (WIRE) ×2 IMPLANT

## 2016-07-14 NOTE — Interval H&P Note (Signed)
Cath Lab Visit (complete for each Cath Lab visit)  Clinical Evaluation Leading to the Procedure:   ACS: Yes.    Non-ACS:    Anginal Classification: CCS IV  Anti-ischemic medical therapy: No Therapy  Non-Invasive Test Results: No non-invasive testing performed  Prior CABG: No previous CABG      History and Physical Interval Note:  07/14/2016 4:05 PM  Phillip [name]  has presented today for surgery, with the diagnosis of Unstable angina  The various methods of treatment have been discussed with the patient and family. After consideration of risks, benefits and other options for treatment, the patient has consented to  Procedure(s): Left Heart Cath and Coronary Angiography (N/A) as a surgical intervention .  The patient's history has been reviewed, patient examined, no change in status, stable for surgery.  I have reviewed the patient's chart and labs.  Questions were answered to the patient's satisfaction.     Sherren Mocha

## 2016-07-14 NOTE — H&P (View-Only) (Signed)
     SUBJECTIVE: No chest pain. No dyspnea.   Tele: sinus  BP 125/70 (BP Location: Left Arm)   Pulse (!) 58   Temp 98 F (36.7 C) (Oral)   Resp 18   Ht 6\' 1"  (1.854 m)   Wt 226 lb 12.8 oz (102.9 kg)   SpO2 99%   BMI 29.92 kg/m   Intake/Output Summary (Last 24 hours) at 07/14/16 0845 Last data filed at 07/14/16 0012  Gross per 24 hour  Intake             1170 ml  Output             1550 ml  Net             -380 ml    PHYSICAL EXAM General: Well developed, well nourished, in no acute distress. Alert and oriented x 3.  Psych:  Good affect, responds appropriately Neck: No JVD. No masses noted.  Lungs: Clear bilaterally with no wheezes or rhonci noted.  Heart: RRR with no murmurs noted. Abdomen: Bowel sounds are present. Soft, non-tender.  Extremities: No lower extremity edema.   LABS: Basic Metabolic Panel:  Recent Labs  07/13/16 0013 07/14/16 0423  NA 140 139  K 3.6 3.8  CL 108 107  CO2 25 24  GLUCOSE 106* 103*  BUN 13 13  CREATININE 1.19 1.20  CALCIUM 8.5* 8.4*   CBC:  Recent Labs  07/12/16 1817 07/13/16 0013  WBC 4.5 3.7*  HGB 13.1 12.1*  HCT 41.0 38.1*  MCV 82.8 82.5  PLT 122* 109*   Cardiac Enzymes:  Recent Labs  07/12/16 1817 07/12/16 2154 07/13/16 0013  TROPONINI <0.03 0.03* <0.03   Fasting Lipid Panel:  Recent Labs  07/14/16 0423  CHOL 102  HDL 18*  LDLCALC 55  TRIG 147  CHOLHDL 5.7    Current Meds: . aspirin  81 mg Oral Daily  . enoxaparin (LOVENOX) injection  40 mg Subcutaneous Q24H  . pantoprazole  40 mg Oral Daily  . sodium chloride flush  3 mL Intravenous Q12H    ASSESSMENT AND PLAN: 65 yo male with no prior cardiac history admitted with unstable angina. Nuclear stress test with possible ischemia.   1. Unstable angina: plan cardiac cath today with possible PCI.  Pre-cath labs ok. Clear liquid breakfast then NPO.     Lauree Chandler  11/29/20178:45 AM

## 2016-07-14 NOTE — Progress Notes (Signed)
PROGRESS NOTE  Phillip [name] [id number] DOB: Nov 04, 1950 DOA: 07/12/2016 PCP: Owens Loffler, MD  Brief History:  65 year old male with history of GERD and Barrett's esophagus presented with 2-3 week history of intermittent chest discomfort. 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 Since that period of time, the patient has had intermittent chest discomfort with exertion as well as at rest. On 07/12/2016, the patient experienced unrelenting chest discomfort while painting his house. He states that his chest discomfort intermittently radiated to his jaw. As a result, the patient presents emergency department for further evaluation. He states that his father died from an MI at age 1. Troponins have been negative, and d-dimer was unremarkable. Myoview stress test was ordered.   Assessment/Plan: Unstable angina -Patient presented with several week history of intermittent exertional chest pain. He ruled out for MI. Stress test 11/28 showed a large perfusion defect and possible ischemia. Cardiology was consulted and planned for cardiac cath 11/30. await results of stress test -if abnormal stress test-->cardiology consult -check lipids: LDL 55. -check a1c: Pending -continue ASA -Echo--EF 55-60%, no WMA, trivial MR/TR - No chest pain this morning when seen prior to cardiac cath.  CKD stage II-III  -Baseline creatinine 1.2-1.5. Stable.   Thrombocytopenia  -Chronic dating back to 12/02/2008  -Serum B12-check today.  -Review of the medical record also shows that he has had intermittent leukopenia  -May need hematology evaluation in the outpatient setting  - Stable.  Fatty liver - By ultrasound abdomen. No features of acute cholecystitis.  GERD - Continue PPI.   Disposition Plan:   Pending cardiac evaluation Family Communication:   No Family at  bedside  Consultants:  none  Code Status:  FULL  DVT Prophylaxis:   Lovenox   Procedures: As Listed in Progress Note Above  Antibiotics: None    Subjective: Seen this morning prior to cardiac cath. Reported mild transient left-sided chest tightness overnight which resolved spontaneously after a couple of minutes. No chest pain currently.   Objective: Vitals:   07/13/16 2022 07/14/16 0011 07/14/16 0434 07/14/16 1208  BP: 137/76 125/70 125/70 124/65  Pulse: (!) 59 (!) 59 (!) 58 (!) 55  Resp: 18 18 18 20   Temp: 98.1 F (36.7 C) 98.2 F (36.8 C) 98 F (36.7 C) 97.9 F (36.6 C)  TempSrc: Oral Oral Oral Oral  SpO2: 100% 99% 99% 100%  Weight:   102.9 kg (226 lb 12.8 oz)   Height:        Intake/Output Summary (Last 24 hours) at 07/14/16 1700 Last data filed at 07/14/16 1449  Gross per 24 hour  Intake             1290 ml  Output             1000 ml  Net              290 ml   Weight change: 0.318 kg (11.2 oz) Exam:   General:  Pt is alert, follows commands appropriately, not in acute distress. Patient sitting up comfortably in chair this morning.  HEENT: No icterus, No thrush, No neck mass, [place]/AT  Cardiovascular: RRR, S1/S2, no rubs, no gallops. Telemetry: Sinus bradycardia in the 50s.  Respiratory: CTA bilaterally, no wheezing, no crackles, no rhonchi. No increased work of breathing.  Abdomen: Soft/+BS, non tender, non distended, no guarding  Extremities:  1 + LE edema, No lymphangitis, No petechiae, No rashes, no synovitis   Data Reviewed: I have personally reviewed following labs and imaging studies Basic Metabolic Panel:  Recent Labs Lab 07/12/16 1500 07/12/16 1817 07/13/16 0013 07/14/16 0423  NA 137  --  140 139  K 4.6  --  3.6 3.8  CL 106  --  108 107  CO2 24  --  25 24  GLUCOSE 109*  --  106* 103*  BUN 15  --  13 13  CREATININE 1.35* 1.28* 1.19 1.20  CALCIUM 9.1  --  8.5* 8.4*   Liver Function Tests:  Recent Labs Lab 07/12/16 1500   AST 24  ALT 18  ALKPHOS 107  BILITOT 0.6  PROT 6.7  ALBUMIN 3.9   No results for input(s): LIPASE, AMYLASE in the last 168 hours. No results for input(s): AMMONIA in the last 168 hours. Coagulation Profile:  Recent Labs Lab 07/14/16 0520  INR 1.13   CBC:  Recent Labs Lab 07/12/16 1500 07/12/16 1817 07/13/16 0013  WBC 4.7 4.5 3.7*  HGB 13.2 13.1 12.1*  HCT 41.9 41.0 38.1*  MCV 82.6 82.8 82.5  PLT 136* 122* 109*   Cardiac Enzymes:  Recent Labs Lab 07/12/16 1500 07/12/16 1817 07/12/16 2154 07/13/16 0013  TROPONINI <0.03 <0.03 0.03* <0.03   BNP: Invalid input(s): POCBNP CBG: No results for input(s): GLUCAP in the last 168 hours. HbA1C: No results for input(s): HGBA1C in the last 72 hours.  Scheduled Meds: . [MAR Hold] aspirin  81 mg Oral Daily  . [MAR Hold] enoxaparin (LOVENOX) injection  40 mg Subcutaneous Q24H  . [MAR Hold] pantoprazole  40 mg Oral Daily  . sodium chloride flush  3 mL Intravenous Q12H   Continuous Infusions: . sodium chloride 75 mL/hr at 07/13/16 1638  . sodium chloride 1 mL/kg/hr (07/14/16 0700)  . heparin      Procedures/Studies: Dg Chest 2 View  Result Date: 07/12/2016 CLINICAL DATA:  Chest discomfort over last several days, worsening today. EXAM: CHEST  2 VIEW COMPARISON:  None. FINDINGS: The heart size and mediastinal contours are within normal limits. Both lungs are clear. The visualized skeletal structures are unremarkable. IMPRESSION: No active cardiopulmonary disease. Electronically Signed   By: Rolm Baptise M.D.   On: 07/12/2016 15:14   Nm Myocar Multi W/spect W/wall Motion / Ef  Result Date: 07/13/2016  Blood pressure demonstrated a hypertensive response to exercise.  There was no ST segment deviation noted during stress.  No T wave inversion was noted during stress.  Defect 1: There is a large defect of moderate severity.  This is a low risk study.  Nuclear stress EF: 56%.  Large size, partially reversible (SDS 3)  anteroseptal, inferoseptal, inferior and apical perfusion defect which is borderline for ischemia. The TID ratio, however, is elevated at 1.64, which can sometimes be associated with proximal left main or multivessel CAD.  LVEF 56% with normal wall motion. This is an equivocal study. Clinical correlation is advised and further testing may be indicated.   US Abdomen Limited Ruq  Result Date: 07/13/2016 CLINICAL DATA:  Six days of chest pain.  Known gallstones. EXAM: US ABDOMEN LIMITED - RIGHT UPPER QUADRANT COMPARISON:  Abdominal and pelvic CT scan of April 20, 2012 FINDINGS: Gallbladder: The gallbladder is adequately distended. An 8 mm stone is observed near the gallbladder neck. There is no gallbladder wall thickening, pericholecystic fluid, or positive sonographic Murphy's sign. Common bile duct: Diameter: 2.5 mm Liver: The hepatic  echotexture is heterogeneously increased. There is no focal mass nor ductal dilation. The surface contour of the liver is normal. IMPRESSION: Gallstones without sonographic evidence of acute cholecystitis. Increased hepatic echotexture most compatible with fatty infiltrative change. Electronically Signed   By: David  Martinique M.D.   On: 07/13/2016 09:47    Kaimana Neuzil, MD, FACP, FHM. [facility] Pager [phone]  If 7PM-7AM, please contact night-coverage [email or web address] Password [email or web address] 07/14/2016, 5:06 PM   LOS: 0 days

## 2016-07-14 NOTE — Progress Notes (Signed)
     SUBJECTIVE: No chest pain. No dyspnea.   Tele: sinus  BP 125/70 (BP Location: Left Arm)   Pulse (!) 58   Temp 98 F (36.7 C) (Oral)   Resp 18   Ht 6\' 1"  (1.854 m)   Wt 226 lb 12.8 oz (102.9 kg)   SpO2 99%   BMI 29.92 kg/m   Intake/Output Summary (Last 24 hours) at 07/14/16 0845 Last data filed at 07/14/16 0012  Gross per 24 hour  Intake             1170 ml  Output             1550 ml  Net             -380 ml    PHYSICAL EXAM General: Well developed, well nourished, in no acute distress. Alert and oriented x 3.  Psych:  Good affect, responds appropriately Neck: No JVD. No masses noted.  Lungs: Clear bilaterally with no wheezes or rhonci noted.  Heart: RRR with no murmurs noted. Abdomen: Bowel sounds are present. Soft, non-tender.  Extremities: No lower extremity edema.   LABS: Basic Metabolic Panel:  Recent Labs  07/13/16 0013 07/14/16 0423  NA 140 139  K 3.6 3.8  CL 108 107  CO2 25 24  GLUCOSE 106* 103*  BUN 13 13  CREATININE 1.19 1.20  CALCIUM 8.5* 8.4*   CBC:  Recent Labs  07/12/16 1817 07/13/16 0013  WBC 4.5 3.7*  HGB 13.1 12.1*  HCT 41.0 38.1*  MCV 82.8 82.5  PLT 122* 109*   Cardiac Enzymes:  Recent Labs  07/12/16 1817 07/12/16 2154 07/13/16 0013  TROPONINI <0.03 0.03* <0.03   Fasting Lipid Panel:  Recent Labs  07/14/16 0423  CHOL 102  HDL 18*  LDLCALC 55  TRIG 147  CHOLHDL 5.7    Current Meds: . aspirin  81 mg Oral Daily  . enoxaparin (LOVENOX) injection  40 mg Subcutaneous Q24H  . pantoprazole  40 mg Oral Daily  . sodium chloride flush  3 mL Intravenous Q12H    ASSESSMENT AND PLAN: 65 yo male with no prior cardiac history admitted with unstable angina. Nuclear stress test with possible ischemia.   1. Unstable angina: plan cardiac cath today with possible PCI.  Pre-cath labs ok. Clear liquid breakfast then NPO.     Lauree Chandler  11/29/20178:45 AM

## 2016-07-14 NOTE — Research (Signed)
CAD LAD Informed Consent   Subject Name: Phillip [name]  Subject met inclusion and exclusion criteria.  The informed consent form, study requirements and expectations were reviewed with the subject and questions and concerns were addressed prior to the signing of the consent form.  The subject verbalized understanding of the trail requirements.  The subject agreed to participate in the CAD LAD trial and signed the informed consent.  The informed consent was obtained prior to performance of any protocol-specific procedures for the subject.  A copy of the signed informed consent was given to the subject and a copy was placed in the subject's medical record.  Hedrick,Astaria Nanez W 07/14/2016, 0905

## 2016-07-15 ENCOUNTER — Encounter (HOSPITAL_COMMUNITY): Payer: Self-pay | Admitting: Cardiovascular Disease

## 2016-07-15 DIAGNOSIS — D61818 Other pancytopenia: Secondary | ICD-10-CM

## 2016-07-15 LAB — BASIC METABOLIC PANEL
ANION GAP: 6 (ref 5–15)
BUN: 12 mg/dL (ref 6–20)
CALCIUM: 8.3 mg/dL — AB (ref 8.9–10.3)
CO2: 23 mmol/L (ref 22–32)
Chloride: 109 mmol/L (ref 101–111)
Creatinine, Ser: 1.08 mg/dL (ref 0.61–1.24)
GFR calc Af Amer: >60 mL/min (ref >60)
Glucose, Bld: 112 mg/dL — ABNORMAL HIGH (ref 65–99)
POTASSIUM: 3.7 mmol/L (ref 3.5–5.1)
SODIUM: 138 mmol/L (ref 135–145)

## 2016-07-15 LAB — CBC
HCT: 38.1 % — ABNORMAL LOW (ref 39.0–52.0)
Hemoglobin: 12.2 g/dL — ABNORMAL LOW (ref 13.0–17.0)
MCH: 26.3 pg (ref 26.0–34.0)
MCHC: 32 g/dL (ref 30.0–36.0)
MCV: 82.3 fL (ref 78.0–100.0)
PLATELETS: 101 10*3/uL — AB (ref 150–400)
RBC: 4.63 MIL/uL (ref 4.22–5.81)
RDW: 16.7 % — AB (ref 11.5–15.5)
WBC: 3.9 10*3/uL — AB (ref 4.0–10.5)

## 2016-07-15 LAB — POCT ACTIVATED CLOTTING TIME: ACTIVATED CLOTTING TIME: 241 s

## 2016-07-15 LAB — HEMOGLOBIN A1C
HEMOGLOBIN A1C: 5.6 % (ref 4.8–5.6)
MEAN PLASMA GLUCOSE: 114 mg/dL

## 2016-07-15 MED ORDER — NITROGLYCERIN 0.4 MG SL SUBL
0.4000 mg | SUBLINGUAL_TABLET | SUBLINGUAL | 0 refills | Status: DC | PRN
Start: 2016-07-15 — End: 2017-01-20

## 2016-07-15 MED ORDER — ATORVASTATIN CALCIUM 20 MG PO TABS: 20.0000 mg | ORAL_TABLET | Freq: Every day | ORAL | 0 refills | Status: DC

## 2016-07-15 MED ORDER — CYANOCOBALAMIN 500 MCG PO TABS: 500.0000 ug | ORAL_TABLET | Freq: Every day | ORAL | 0 refills | Status: AC

## 2016-07-15 MED ORDER — ATORVASTATIN CALCIUM 20 MG PO TABS: 20.0000 mg | ORAL_TABLET | Freq: Every day | ORAL | Status: DC

## 2016-07-15 MED ORDER — VITAMIN B-12 1000 MCG PO TABS: 500.0000 ug | ORAL_TABLET | Freq: Every day | ORAL | Status: DC

## 2016-07-15 MED ORDER — TICAGRELOR 90 MG PO TABS
90.0000 mg | ORAL_TABLET | Freq: Two times a day (BID) | ORAL | Status: DC
Start: 2016-07-15 — End: 2016-07-16

## 2016-07-15 NOTE — Progress Notes (Signed)
TR BAND REMOVAL  LOCATION:    right radial  DEFLATED PER PROTOCOL:    Yes.    TIME BAND OFF / DRESSING APPLIED:    0000    SITE UPON ARRIVAL:    Level 0  SITE AFTER BAND REMOVAL:    Level 0  CIRCULATION SENSATION AND MOVEMENT:    Within Normal Limits   Yes.    COMMENTS:   Pt tolerated well; radial site teaching, cleaning, dressing, movement teaching done and re-verbalized.

## 2016-07-15 NOTE — Research (Signed)
TWILIGHT Informed Consent   Subject Name: Phillip [name]  Subject met inclusion and exclusion criteria.  The informed consent form, study requirements and expectations were reviewed with the subject and questions and concerns were addressed prior to the signing of the consent form.  The subject verbalized understanding of the trail requirements.  The subject agreed to participate in the TWILIGHT trial and signed the informed consent.  The informed consent was obtained prior to performance of any protocol-specific procedures for the subject.  A copy of the signed informed consent was given to the subject and a copy was placed in the subject's medical record.  Jake Bathe Jr. 07/15/2016, [phone]

## 2016-07-15 NOTE — Progress Notes (Signed)
CARDIAC REHAB PHASE I   PRE:  Rate/Rhythm: 40 SR  BP:  Sitting: 154/87        SaO2: 100 RA  MODE:  Ambulation: 700 ft   POST:  Rate/Rhythm: 77 SR  BP:  Sitting: 162/89         SaO2: 100 RA  Pt ambulated 700 ft on RA, independent, steady gait, tolerated well with no complaints. Completed PCI/stent education.  Reviewed risk factors, PCI book, anti-platelet therapy, stent card, activity restrictions, ntg, exercise, heart healthy diet, and phase 2 cardiac rehab. 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 Pt agrees to phase 2 cardiac rehab referral, will send to [facility] per pt request. Pt to bed per pt request after walk, call bell within reach.   Lyman, RN, BSN 07/15/2016 8:51 AM

## 2016-07-15 NOTE — Care Management Note (Signed)
Case Management Note  Patient Details  Name: Phillip [name] MRN: [id number] Date of Birth: Nov 13, 1950  Subjective/Objective:    S/p DES, will be on brilinta,per note patient will be participating in twilight study.  PTA indep, lives with wife, has medication coverage , a pcp and transportation at discharge.                   Action/Plan:   Expected Discharge Date:                  Expected Discharge Plan:  Home/Self Care  In-House Referral:     Discharge planning Services  CM Consult  Post Acute Care Choice:    Choice offered to:     DME Arranged:    DME Agency:     HH Arranged:    HH Agency:     Status of Service:  Completed, signed off  If discussed at [employer] of Stay Meetings, dates discussed:    Additional Comments:  Zenon Mayo, RN 07/15/2016, 8:45 AM

## 2016-07-15 NOTE — Progress Notes (Addendum)
     SUBJECTIVE: No chest pain or dyspnea.   Tele: sinus  BP (!) 154/87 (BP Location: Left Arm)   Pulse 64   Temp 98 F (36.7 C) (Oral)   Resp 19   Ht 6\' 1"  (1.854 m)   Wt 224 lb 13.9 oz (102 kg)   SpO2 100%   BMI 29.67 kg/m   Intake/Output Summary (Last 24 hours) at 07/15/16 [id number] Last data filed at 07/15/16 0805  Gross per 24 hour  Intake          2005.95 ml  Output             1750 ml  Net           255.95 ml    PHYSICAL EXAM General: Well developed, well nourished, in no acute distress. Alert and oriented x 3.  Psych:  Good affect, responds appropriately Neck: No JVD. No masses noted.  Lungs: Clear bilaterally with no wheezes or rhonci noted.  Heart: RRR with no murmurs noted. Abdomen: Bowel sounds are present. Soft, non-tender.  Extremities: No lower extremity edema.   LABS: Basic Metabolic Panel:  Recent Labs  07/14/16 0423 07/15/16 0225  NA 139 138  K 3.8 3.7  CL 107 109  CO2 24 23  GLUCOSE 103* 112*  BUN 13 12  CREATININE 1.20 1.08  CALCIUM 8.4* 8.3*   CBC:  Recent Labs  07/13/16 0013 07/15/16 0225  WBC 3.7* 3.9*  HGB 12.1* 12.2*  HCT 38.1* 38.1*  MCV 82.5 82.3  PLT 109* 101*   Cardiac Enzymes:  Recent Labs  07/12/16 1817 07/12/16 2154 07/13/16 0013  TROPONINI <0.03 0.03* <0.03   Fasting Lipid Panel:  Recent Labs  07/14/16 0423  CHOL 102  HDL 18*  LDLCALC 55  TRIG 147  CHOLHDL 5.7    Current Meds: . aspirin  81 mg Oral Daily  . pantoprazole  40 mg Oral Daily  . sodium chloride flush  3 mL Intravenous Q12H  . ticagrelor  90 mg Oral BID     ASSESSMENT AND PLAN:  1. CAD/Unstable angina: He was admitted with unstable angina. Cardiac cath 07/14/16 with severe stenosis proximal LAD treated with a DES x 1.  Will continue DAPT with ASA and Brilinta (He will be enrolled in the TWILIGHT study-ASA/Brilinta for 3 months then randomization to stop ASA or continue ASA with Brilinta). Will start a statin today. No beta blocker with  bradycardia.   OK to discharge home today. I will arrange f/u in the [facility] office at his request for 2-3 week follow up with [place].   Lauree Chandler  11/30/20178:42 AM

## 2016-07-15 NOTE — Discharge Summary (Signed)
Physician Discharge Summary  Phillip [name] [id number] DOB: 03/04/1951  PCP: Owens Loffler, MD  Admit date: 07/12/2016 Discharge date: 07/15/2016  Recommendations for Outpatient Follow-up:  1. Dr. Owens Loffler, PCP in one week with repeat labs (CBC). Consider outpatient hematology consultation for evaluation and management of thrombocytopenia. 2. Leroy Sea, NP/Cardiology on 07/23/16 at 1:30 PM.  Home Health: None Equipment/Devices: None    Discharge Condition: Improved and stable  CODE STATUS: Full  Diet recommendation: Heart healthy diet.  Discharge Diagnoses:  Active Problems:   Thrombocytopenia (HCC)   Chest pain   Angina pectoris (HCC)   CKD (chronic kidney disease) stage 3, GFR 30-59 ml/min   Brief/Interim Summary: 65 year old male with history of GERD and Barrett's esophagus presented with 2-3 week history of intermittent chest discomfort. 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 Since that period of time, the patient has had intermittent chest discomfort with exertion as well as at rest. On 07/12/2016, the patient experienced unrelenting chest discomfort while painting his house. He states that his chest discomfort intermittently radiated to his jaw. As a result, the patient presented to the emergency department for further evaluation. He states that his father died from an MI at age 6.   Assessment/Plan: Unstable angina - Patient presented with several week history of intermittent exertional chest pain. He ruled out for MI. Stress test 11/28 showed a large perfusion defect and possible ischemia. Cardiology performed cardiac cath 07/14/16 which showed severe stenosis proximal LAD which was treated with a DES stent. - Discussed with cardiology who have seen him today and recommend continuing dual antiplatelet therapy with aspirin and Brilinta. He will  be enrolled in the TWILIGHT study-ASA/Brilinta 3 months and then randomization to stop aspirin or continue aspirin with Brilinta. Starting atorvastatin 20 MG daily. No beta blockers due to bradycardia. Cardiology have cleared him for discharge and have arranged outpatient follow-up. As per cardiology, DAPT will be provided to the patient underwent the TWILIGHT study. -  lipids: LDL 55. -  a1c: 5.6 - Echo--EF 55-60%, no WMA, trivial MR/TR  CKD stage II  -Baseline creatinine 1.2-1.5. Stable.   Thrombocytopenia/pancytopenia  -Chronic dating back to 12/02/2008  -Serum B12-203 -Review of the medical record also shows that he has had intermittent leukopenia  -May need hematology evaluation in the outpatient setting  - Follow CBCs in a few days as outpatient.  Fatty liver - By ultrasound abdomen. No features of acute cholecystitis.  GERD - Continue PPI.  Possible B12 deficiency - Start B12 supplements.  Cholelithiasis - By abdominal ultrasound without features of acute cholecystitis.   Discharge Instructions  Discharge Instructions    Amb Referral to Cardiac Rehabilitation    Complete by:  As directed    Diagnosis:  Coronary Stents   Call MD for:  difficulty breathing, headache or visual disturbances    Complete by:  As directed    Call MD for:  extreme fatigue    Complete by:  As directed    Call MD for:  persistant dizziness or light-headedness    Complete by:  As directed    Call MD for:  severe uncontrolled pain    Complete by:  As directed    Diet - low sodium heart healthy    Complete by:  As directed    Increase activity slowly    Complete by:  As directed  Medication List    TAKE these medications   albuterol 108 (90 Base) MCG/ACT inhaler Commonly known as:  PROVENTIL HFA;VENTOLIN HFA Inhale 2 puffs into the lungs every 6 (six) hours as needed for wheezing or shortness of breath.   aspirin 81 MG tablet Take 81 mg by mouth daily.   atorvastatin 20  MG tablet Commonly known as:  LIPITOR Take 1 tablet (20 mg total) by mouth daily at 6 PM.   cyanocobalamin 500 MCG tablet Take 1 tablet (500 mcg total) by mouth daily.   folic acid Q000111Q MCG tablet Commonly known as:  FOLVITE Take 400 mcg by mouth daily.   nitroGLYCERIN 0.4 MG SL tablet Commonly known as:  NITROSTAT Place 1 tablet (0.4 mg total) under the tongue every 5 (five) minutes as needed for chest pain. Max: 3 doses.   omeprazole 40 MG capsule Commonly known as:  PRILOSEC Take 40 mg by mouth daily.   saw palmetto 80 MG capsule Take 80 mg by mouth daily.   ticagrelor 90 MG Tabs tablet Commonly known as:  BRILINTA Take 1 tablet (90 mg total) by mouth 2 (two) times daily.   VITAMIN C PO Take 1 tablet by mouth daily.   ZINC PO Take 1 tablet by mouth daily.      Follow-up Information    Murray Hodgkins, NP Follow up on 07/23/2016.   Specialties:  Nurse Practitioner, Cardiology, Radiology Why:  Cardiology Hospital Follow-Up on 07/23/2016 at 1:30PM. Contact information: [address] [place] [address] [phone]        Spencer Copland, MD. Schedule an appointment as soon as possible for a visit in 1 week(s).   Specialty:  Family Medicine Why:  To be seen with repeat labs (CBC). Contact information: [place] [address] [phone]          Allergies  Allergen Reactions  . Penicillins     REACTION: Rash as a child - Tolerated AMOXICILLIN SEVERAL TIMES AS ADULT RECENTLY Has patient had a PCN reaction causing immediate rash, facial/tongue/throat swelling, SOB or lightheadedness with hypotension: YES Has patient had a PCN reaction causing severe rash involving mucus membranes or skin necrosis:NO Has patient had a PCN reaction that required hospitalization NO Has patient had a PCN reaction occurring within the last 10 years: NO If all of the above answers are "NO", then may proceed with      Consultations:  Cardiology   Procedures/Studies: Dg Chest 2 View  Result Date: 07/12/2016 CLINICAL DATA:  Chest discomfort over last several days, worsening today. EXAM: CHEST  2 VIEW COMPARISON:  None. FINDINGS: The heart size and mediastinal contours are within normal limits. Both lungs are clear. The visualized skeletal structures are unremarkable. IMPRESSION: No active cardiopulmonary disease. Electronically Signed   By: Rolm Baptise M.D.   On: 07/12/2016 15:14   Nm Myocar Multi W/spect W/wall Motion / Ef  Result Date: 07/13/2016  Blood pressure demonstrated a hypertensive response to exercise.  There was no ST segment deviation noted during stress.  No T wave inversion was noted during stress.  Defect 1: There is a large defect of moderate severity.  This is a low risk study.  Nuclear stress EF: 56%.  Large size, partially reversible (SDS 3) anteroseptal, inferoseptal, inferior and apical perfusion defect which is borderline for ischemia. The TID ratio, however, is elevated at 1.64, which can sometimes be associated with proximal left main or multivessel CAD.  LVEF 56% with normal  wall motion. This is an equivocal study. Clinical correlation is advised and further testing may be indicated.   US Abdomen Limited Ruq  Result Date: 07/13/2016 CLINICAL DATA:  Six days of chest pain.  Known gallstones. EXAM: US ABDOMEN LIMITED - RIGHT UPPER QUADRANT COMPARISON:  Abdominal and pelvic CT scan of April 20, 2012 FINDINGS: Gallbladder: The gallbladder is adequately distended. An 8 mm stone is observed near the gallbladder neck. There is no gallbladder wall thickening, pericholecystic fluid, or positive sonographic Murphy's sign. Common bile duct: Diameter: 2.5 mm Liver: The hepatic echotexture is heterogeneously increased. There is no focal mass nor ductal dilation. The surface contour of the liver is normal. IMPRESSION: Gallstones without sonographic evidence of acute cholecystitis.  Increased hepatic echotexture most compatible with fatty infiltrative change. Electronically Signed   By: David  Martinique M.D.   On: 07/13/2016 09:47  2-D echo 07/13/16: Study Conclusions  - Left ventricle: The cavity size was normal. Systolic function was   normal. The estimated ejection fraction was in the range of 55%   to 60%. Wall motion was normal; there were no regional wall   motion abnormalities. - Aortic valve: There was mild regurgitation. - Mitral valve: There was trivial regurgitation. - Atrial septum: There was increased thickness of the septum,   consistent with lipomatous hypertrophy. - Tricuspid valve: There was trivial regurgitation. - Pulmonic valve: There was trivial regurgitation.   Left heart cath and coronary angiography, coronary stent intervention:  Conclusion   1. Severe single-vessel coronary artery disease with critical stenosis of the proximal/mid LAD, treated successfully with a Promus DES 2. Normal LV function by echo with normal LVEDP  Recommendations: Dual antiplatelet therapy with aspirin and ticagrelor for 12 months. Consider Twilight Trial if patient is a candidate.         Subjective: Seen this morning. Denied chest pain or dyspnea. Seen ambulating comfortably with cardiac rehabilitation team.  Discharge Exam:  Vitals:   07/14/16 1830 07/14/16 1926 07/15/16 0458 07/15/16 0800  BP: 120/70 (!) 141/78 134/79 (!) 154/87  Pulse: (!) 51 (!) 56 (!) 56 64  Resp: 11 14 20 19   Temp:  97.9 F (36.6 C)  98 F (36.7 C)  TempSrc:  Oral Oral Oral  SpO2: 100% 100% 99% 100%  Weight:   102 kg (224 lb 13.9 oz)   Height:        General: Pt lying comfortably in bed & appears in no obvious distress. Cardiovascular: S1 & S2 heard, RRR, S1/S2 +. No murmurs, rubs, gallops or clicks. No JVD or pedal edema.Telemetry: Sinus bradycardia in the 50s. Respiratory: Clear to auscultation without wheezing, rhonchi or crackles. No increased work of  breathing. Abdominal:  Non distended, non tender & soft. No organomegaly or masses appreciated. Normal bowel sounds heard. CNS: Alert and oriented. No focal deficits. Extremities: no edema, no cyanosis. Right wrist cath site without acute findings or bleeding.    The results of significant diagnostics from this hospitalization (including imaging, microbiology, ancillary and laboratory) are listed below for reference.     Microbiology: No results found for this or any previous visit (from the past 240 hour(s)).   Labs: BNP (last 3 results) No results for input(s): BNP in the last 8760 hours. Basic Metabolic Panel:  Recent Labs Lab 07/12/16 1500 07/12/16 1817 07/13/16 0013 07/14/16 0423 07/15/16 0225  NA 137  --  140 139 138  K 4.6  --  3.6 3.8 3.7  CL 106  --  108 107 109  CO2 24  --  25 24 23   GLUCOSE 109*  --  106* 103* 112*  BUN 15  --  13 13 12   CREATININE 1.35* 1.28* 1.19 1.20 1.08  CALCIUM 9.1  --  8.5* 8.4* 8.3*   Liver Function Tests:  Recent Labs Lab 07/12/16 1500  AST 24  ALT 18  ALKPHOS 107  BILITOT 0.6  PROT 6.7  ALBUMIN 3.9   No results for input(s): LIPASE, AMYLASE in the last 168 hours. No results for input(s): AMMONIA in the last 168 hours. CBC:  Recent Labs Lab 07/12/16 1500 07/12/16 1817 07/13/16 0013 07/15/16 0225  WBC 4.7 4.5 3.7* 3.9*  HGB 13.2 13.1 12.1* 12.2*  HCT 41.9 41.0 38.1* 38.1*  MCV 82.6 82.8 82.5 82.3  PLT 136* 122* 109* 101*   Cardiac Enzymes:  Recent Labs Lab 07/12/16 1500 07/12/16 1817 07/12/16 2154 07/13/16 0013  TROPONINI <0.03 <0.03 0.03* <0.03   BNP: Invalid input(s): POCBNP CBG: No results for input(s): GLUCAP in the last 168 hours. D-Dimer  Recent Labs  07/12/16 2154  DDIMER 0.40   Hgb A1c  Recent Labs  07/14/16 0423  HGBA1C 5.6   Lipid Profile  Recent Labs  07/14/16 0423  CHOL 102  HDL 18*  LDLCALC 55  TRIG 147  CHOLHDL 5.7   Thyroid function studies  Recent Labs   07/12/16 1817  TSH 1.900   Anemia work up  Recent Labs  07/14/16 [place]      Time coordinating discharge: Over 30 minutes  SIGNED:  Vernell Leep, MD, FACP, FHM. [facility] Pager [phone]  If 7PM-7AM, please contact night-coverage [email or web address] Password [email or web address] 07/15/2016, 11:44 AM

## 2016-07-16 ENCOUNTER — Other Ambulatory Visit: Payer: Self-pay | Admitting: *Deleted

## 2016-07-16 ENCOUNTER — Telehealth: Payer: Self-pay | Admitting: *Deleted

## 2016-07-16 MED ORDER — AMBULATORY NON FORMULARY MEDICATION: 81.0000 mg | Freq: Every day | Status: DC

## 2016-07-16 MED ORDER — AMBULATORY NON FORMULARY MEDICATION: 90.0000 mg | Freq: Two times a day (BID) | Status: DC

## 2016-07-16 NOTE — Telephone Encounter (Addendum)
Ysmael Salinger Jahnke [id number] DOB: 25-Mar-1951  PCP: Owens Loffler, MD  Admit date: 07/12/2016 Discharge date: 07/15/2016  Recommendations for Outpatient Follow-up:  1. Dr. Owens Loffler, PCP in one week with repeat labs (CBC). Consider outpatient hematology consultation for evaluation and management of thrombocytopenia. 2. Leroy Sea, NP/Cardiology on 07/23/16 at 1:30 PM.  Home Health: None Equipment/Devices: None    Discharge Condition: Improved and stable  CODE STATUS: Full  Diet recommendation: Heart healthy diet.  Transition Care Management Follow-up Telephone Call   How have you been since you were released from the hospital? 11/30   Do you understand why you were in the hospital? yes   Do you understand the discharge instructions? yes   Where were you discharged to? Home    Items Reviewed:  Medications reviewed: no, pt will review in office at follow up  Allergies reviewed: yes  Dietary changes reviewed: yes  Referrals reviewed: yes   Functional Questionnaire:   Activities of Daily Living (ADLs):   He states they are independent in the following: ambulation, bathing and hygiene, feeding, continence, grooming, toileting and dressing States they require assistance with the following: none   Any transportation issues/concerns?: no   Any patient concerns? no   Confirmed importance and date/time of follow-up visits scheduled yes  Provider Appointment booked with [facility] 07/22/16.  Confirmed with patient if condition begins to worsen call PCP or go to the ER.  Patient was given the office number and encouraged to call back with question or concerns.  : yes

## 2016-07-16 NOTE — Telephone Encounter (Signed)
Unable to reach patient at time of TCM/Hospital follow up Call.  Left message for patient to return call when available.

## 2016-07-22 ENCOUNTER — Ambulatory Visit (INDEPENDENT_AMBULATORY_CARE_PROVIDER_SITE_OTHER): Payer: Medicare Other | Admitting: Family Medicine

## 2016-07-22 ENCOUNTER — Encounter: Payer: Self-pay | Admitting: Family Medicine

## 2016-07-22 VITALS — BP 110/76 | HR 65 | Temp 98.5°F | Ht 73.0 in | Wt 230.0 lb

## 2016-07-22 DIAGNOSIS — I2510 Atherosclerotic heart disease of native coronary artery without angina pectoris: Secondary | ICD-10-CM | POA: Diagnosis not present

## 2016-07-22 DIAGNOSIS — D72819 Decreased white blood cell count, unspecified: Secondary | ICD-10-CM | POA: Diagnosis not present

## 2016-07-22 DIAGNOSIS — I209 Angina pectoris, unspecified: Secondary | ICD-10-CM

## 2016-07-22 DIAGNOSIS — Z23 Encounter for immunization: Secondary | ICD-10-CM

## 2016-07-22 DIAGNOSIS — D696 Thrombocytopenia, unspecified: Secondary | ICD-10-CM

## 2016-07-22 DIAGNOSIS — N183 Chronic kidney disease, stage 3 unspecified: Secondary | ICD-10-CM

## 2016-07-22 LAB — CBC WITH DIFFERENTIAL/PLATELET
BASOS ABS: 0 10*3/uL (ref 0.0–0.1)
BASOS PCT: 0.5 % (ref 0.0–3.0)
EOS ABS: 0.1 10*3/uL (ref 0.0–0.7)
Eosinophils Relative: 1.2 % (ref 0.0–5.0)
HCT: 42.3 % (ref 39.0–52.0)
HEMOGLOBIN: 13.8 g/dL (ref 13.0–17.0)
Lymphocytes Relative: 23.5 % (ref 12.0–46.0)
Lymphs Abs: 1 10*3/uL (ref 0.7–4.0)
MCHC: 32.6 g/dL (ref 30.0–36.0)
MCV: 81.8 fl (ref 78.0–100.0)
MONO ABS: 0.4 10*3/uL (ref 0.1–1.0)
Monocytes Relative: 9.6 % (ref 3.0–12.0)
Neutro Abs: 2.9 10*3/uL (ref 1.4–7.7)
Neutrophils Relative %: 65.2 % (ref 43.0–77.0)
Platelets: 145 10*3/uL — ABNORMAL LOW (ref 150.0–400.0)
RBC: 5.18 Mil/uL (ref 4.22–5.81)
RDW: 18.2 % — AB (ref 11.5–15.5)
WBC: 4.5 10*3/uL (ref 4.0–10.5)

## 2016-07-22 NOTE — Assessment & Plan Note (Signed)
Resolved  symptoms with stenting.

## 2016-07-22 NOTE — Assessment & Plan Note (Signed)
Stable in hospital.

## 2016-07-22 NOTE — Progress Notes (Signed)
Pre visit review using our clinic review tool, if applicable. No additional management support is needed unless otherwise documented below in the visit note. 

## 2016-07-22 NOTE — Patient Instructions (Signed)
Please stop at the front desk or lab to set up referral or to have labs drawn.  

## 2016-07-22 NOTE — Assessment & Plan Note (Addendum)
Treated with stent. Now on dual antiplatelet therapy for 3 months.  Pt with HA but willing to see if resolves with time on Brilinta.  Now on statin low dose.Marland Kitchen Re-eval with PCP with chol check in 3 months. No SE.

## 2016-07-22 NOTE — Assessment & Plan Note (Signed)
Chronic.. Asymptomatic.  Will re-eval given new leukopenia and lower platelets at discharge.

## 2016-07-22 NOTE — Assessment & Plan Note (Signed)
Family history of leukemia.  Pt with borderline low wbc.. Will recheck with diff.

## 2016-07-22 NOTE — Progress Notes (Signed)
Subjective:    Patient ID: Phillip [name], male    DOB: 1951-06-25, 65 y.o.   MRN: [id number]  HPI   65 year old male presents for hospital follow up. PCP Dr. Lorelei Pont.  Admit date: 07/12/2016 Discharge date: 07/15/2016  Recommendations for Outpatient Follow-up:  1. Dr. Owens Loffler, PCP in one week with repeat labs (CBC). Consider outpatient hematology consultation for evaluation and management of thrombocytopenia. 2. Leroy Sea, NP/Cardiology on 07/23/16 at 1:30 PM.  Hospital summary per discharge note is as follows:  65 year old male with history of GERD and Barrett's esophagus presented with 2-3 week history of intermittent chest discomfort. 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 Since that period of time, the patient has had intermittent chest discomfort with exertion as well as at rest. On 07/12/2016, the patient experienced unrelenting chest discomfort while painting his house. He states that his chest discomfort intermittently radiated to his jaw. As a result, the patient presented to the emergency department for further evaluation. He states that his father died from an MI at age 13.    Unstable angina: He ruled out for MI. Stress test 11/28 showed a large perfusion defect and possible ischemia. Cardiology performed cardiac cath 07/14/16 which showed severe stenosis proximal LAD which was treated with a DES stent. Cadiology recommended dual antiplatelet therapy with ASA/Brilinta x 3 months. Stated on statin 20 mg daily. No beta blocker given his bradycardia.  B12 supplement was initiated. And hospitalist recommended consideration of referral to heme for thrombocytopenia that has been chronic.  1 year ago platelet 126  At admission pt 136, at discharge 101.Marland Kitchen Also new issue with leukopenia wbc 3.9.. No diff done.     Today: The patient reports  Since he has  been home he is getting his strength back. Remains fatgiued,he has started going for walks. No exertional symptoms.  He has had a mild persistent headache from the Brilinta since starting. He is in [place] study.  no chest pain , no shortness of breath.  He has been able to go back to work.   Review of Systems  Constitutional: Negative for fatigue, fever and unexpected weight change.  HENT: Negative for congestion, ear pain, postnasal drip, rhinorrhea, sore throat and trouble swallowing.   Eyes: Negative for pain.  Respiratory: Negative for cough, shortness of breath and wheezing.   Cardiovascular: Negative for chest pain, palpitations and leg swelling.  Gastrointestinal: Negative for abdominal pain, blood in stool, constipation, diarrhea and nausea.  Genitourinary: Negative for difficulty urinating, discharge, dysuria, hematuria, penile pain, penile swelling, scrotal swelling, testicular pain and urgency.  Skin: Negative for rash.  Neurological: Negative for syncope, weakness, light-headedness, numbness and headaches.  Psychiatric/Behavioral: Negative for behavioral problems and dysphoric mood. The patient is not nervous/anxious.        Objective:   Physical Exam  Constitutional: Vital signs are normal. He appears well-developed and well-nourished.  HENT:  Head: Normocephalic.  Right Ear: Hearing normal.  Left Ear: Hearing normal.  Nose: Nose normal.  Mouth/Throat: Oropharynx is clear and moist and mucous membranes are normal.  Neck: Trachea normal. Carotid bruit is not present. No thyroid mass and no thyromegaly present.  Cardiovascular: Normal rate, regular rhythm and normal pulses.  Exam reveals no gallop, no distant heart sounds and no friction rub.   No murmur heard. No peripheral edema  Pulmonary/Chest: Effort normal and breath sounds normal. No respiratory  distress.  Skin: Skin is warm, dry and intact. No rash noted.  Psychiatric: He has a normal mood and affect. His  speech is normal and behavior is normal. Thought content normal.          Assessment & Plan:

## 2016-07-23 ENCOUNTER — Ambulatory Visit (INDEPENDENT_AMBULATORY_CARE_PROVIDER_SITE_OTHER): Payer: Medicare Other | Admitting: Nurse Practitioner

## 2016-07-23 ENCOUNTER — Encounter: Payer: Self-pay | Admitting: Nurse Practitioner

## 2016-07-23 VITALS — BP 130/64 | HR 64 | Ht 73.0 in | Wt 232.2 lb

## 2016-07-23 DIAGNOSIS — I209 Angina pectoris, unspecified: Secondary | ICD-10-CM

## 2016-07-23 DIAGNOSIS — I200 Unstable angina: Secondary | ICD-10-CM

## 2016-07-23 DIAGNOSIS — E785 Hyperlipidemia, unspecified: Secondary | ICD-10-CM

## 2016-07-23 DIAGNOSIS — I25119 Atherosclerotic heart disease of native coronary artery with unspecified angina pectoris: Secondary | ICD-10-CM

## 2016-07-23 MED ORDER — ATORVASTATIN CALCIUM 20 MG PO TABS: 20.0000 mg | ORAL_TABLET | Freq: Every day | ORAL | 6 refills | Status: DC

## 2016-07-23 NOTE — Patient Instructions (Signed)
Medication Instructions:  Please continue your current medications  Labwork: Liver, lipid in 1 month  Testing/Procedures: None  Follow-Up: 3 months w/ Dr. Saunders Revel  If you need a refill on your cardiac medications before your next appointment, please call your pharmacy.

## 2016-07-23 NOTE — Progress Notes (Signed)
Office Visit    Patient Name: Phillip [name] Date of Encounter: 07/23/2016  Primary Care Provider:  Owens Loffler, MD Primary Cardiologist:  Pt will f/u with C. End, MD   Chief Complaint    65 year old male status post recent auscultation front stable angina requiring stenting of the LAD, who presents for follow-up.  Past Medical History    Past Medical History:  Diagnosis Date  . Acute bronchitis 10/11/2015  . Allergic rhinitis   . Arthritis   . CAD (coronary artery disease)    a. 06/2016 Echo: EF 55-60%, no rwma, mild AI, triv MR/TR/PR, lipomatous hypertrophy of atrial septum;  b. 06/2016 Ex MV: EF 56%, large, partially reversible anteroseptal, inferoseptal, inferior,and apical perfusion defect - borderline for ischemia (TID 1.64) - equivocal study;  c. 06/2016 PCI: LM nl, LAD 25p/55m (3.0x24 Promus Premier MR DES), LCX min irregs, RCA nl.  . Colon polyps    a. Severe dysplasia in a right-sided polyp removed surgically in [place], 2006--> final pathology showed this was not a cancer.  Follow up colonoscopy at one year interval was normal.  . Erectile dysfunction   . GERD (gastroesophageal reflux disease)    H/o Barrett's esophagus, orginally diagnosed in 2006, biopsies showed no dysplasia; follow up EGD also showed no dysplasia  . History of BPH   . Lyme disease    x2  . Prostate infection    Past Surgical History:  Procedure Laterality Date  . CARDIAC CATHETERIZATION N/A 07/14/2016   Procedure: Left Heart Cath and Coronary Angiography;  Surgeon: Sherren Mocha, MD;  Location: [facility];  Service: Cardiovascular;  Laterality: N/A;  . CARDIAC CATHETERIZATION N/A 07/14/2016   Procedure: Coronary Stent Intervention;  Surgeon: Sherren Mocha, MD;  Location: [facility];  Service: Cardiovascular;  Laterality: N/A;  . COLONOSCOPY  12/2008   Repeat 5 years  . ESOPHAGOGASTRODUODENOSCOPY  12/2008   Barrett's, repeat 3 years  . HEMICOLECTOMY  2006   Right, for severely dysplastic right colon polyp  . HERNIA REPAIR  [id number]   UMBILICAL  . LUMBAR DISC SURGERY  1992   L4-5  . TONSILLECTOMY     As a child  . TUMOR REMOVAL  2002   Fatty tumor (neck)  . UPPER GASTROINTESTINAL ENDOSCOPY    . VARICOSE VEIN SURGERY  1997   Removal, right    Allergies  Allergies  Allergen Reactions  . Penicillins     REACTION: Rash as a child - Tolerated AMOXICILLIN SEVERAL TIMES AS ADULT RECENTLY Has patient had a PCN reaction causing immediate rash, facial/tongue/throat swelling, SOB or lightheadedness with hypotension: YES Has patient had a PCN reaction causing severe rash involving mucus membranes or skin necrosis:NO Has patient had a PCN reaction that required hospitalization NO Has patient had a PCN reaction occurring within the last 10 years: NO If all of the above answers are "NO", then may proceed with     History of Present Illness    65 year old male with the above past medical history. In 2011, he had some episodic chest wall pain and was seen by cardiology but was not felt to require testing at that time. Over the past several weeks, he has been experiencing exertional dyspnea and chest pressure. He was admitted to [facility] in late November and ruled out. Stress testing was performed and mildly abnormal for the presence of ischemia. Diagnostic catheterization was performed revealing severe mid LAD disease and otherwise minor irregularities. LV function was normal. The  LAD was successful treated with a drug-eluting stent. He tolerated the procedure well and was discharged home the next day. He is enrolled in the twilight study. Since discharge, he has done well without any recurrence of chest discomfort. He does plan to partake in cardiac rehabilitation. He denies dyspnea, palpitations, PND, orthopnea, dizziness, syncope, edema, or early satiety.  Home Medications    Prior to Admission medications   Medication Sig Start Date End Date Taking?  Authorizing Provider  albuterol (PROVENTIL HFA;VENTOLIN HFA) 108 (90 Base) MCG/ACT inhaler Inhale 2 puffs into the lungs every 6 (six) hours as needed for wheezing or shortness of breath. 10/11/15  Yes Mosie Lukes, MD  AMBULATORY NON FORMULARY MEDICATION Take 90 mg by mouth 2 (two) times daily. Medication Name: BRILINTA 90 MG bid (TWILIGHT Research Study PROVIDED) 07/15/16  Yes Burnell Blanks, MD  AMBULATORY NON FORMULARY MEDICATION Take 81 mg by mouth daily. Medication Name: ASPIRIN 81 mg Daily (TWILIGHT Research study PROVIDED) 07/15/16  Yes Burnell Blanks, MD  Ascorbic Acid (VITAMIN C PO) Take 1 tablet by mouth daily.   Yes Historical Provider, MD  atorvastatin (LIPITOR) 20 MG tablet Take 1 tablet (20 mg total) by mouth daily at 6 PM. 07/15/16  Yes Modena Jansky, MD  folic acid (FOLVITE) [id number] MCG tablet Take 400 mcg by mouth daily.   Yes Historical Provider, MD  Multiple Vitamins-Minerals (ZINC PO) Take 1 tablet by mouth daily.   Yes Historical Provider, MD  nitroGLYCERIN (NITROSTAT) 0.4 MG SL tablet Place 1 tablet (0.4 mg total) under the tongue every 5 (five) minutes as needed for chest pain. Max: 3 doses. 07/15/16  Yes Modena Jansky, MD  omeprazole (PRILOSEC) 40 MG capsule Take 40 mg by mouth daily.    Yes Historical Provider, MD  saw palmetto 80 MG capsule Take 80 mg by mouth daily.    Yes Historical Provider, MD  vitamin B-12 500 MCG tablet Take 1 tablet (500 mcg total) by mouth daily. 07/15/16  Yes Modena Jansky, MD    Review of Systems    He denies chest pain, palpitations, dyspnea, pnd, orthopnea, n, v, dizziness, syncope, edema, weight gain, or early satiety.  All other systems reviewed and are otherwise negative except as noted above.  Physical Exam    VS:  BP 130/64 (BP Location: Left Arm, Patient Position: Sitting, Cuff Size: Normal)   Pulse 64   Ht 6\' 1"  (1.854 m)   Wt 232 lb 4 oz (105.3 kg)   BMI 30.64 kg/m  , BMI Body mass index is 30.64  kg/m. GEN: Well nourished, well developed, in no acute distress.  HEENT: normal.  Neck: Supple, no JVD, carotid bruits, or masses. Cardiac: RRR, no murmurs, rubs, or gallops. No clubbing, cyanosis, edema.  Radials/DP/PT 2+ and equal bilaterally. Right wrist catheterization site is mildly ecchymotic without bruit or hematoma. Respiratory:  Respirations regular and unlabored, clear to auscultation bilaterally. GI: Soft, nontender, nondistended, BS + x 4. MS: no deformity or atrophy. Skin: warm and dry, no rash. Neuro:  Strength and sensation are intact. Psych: Normal affect.  Accessory Clinical Findings    ECG - Regular sinus rhythm, 64, no acute ST or T changes.  Assessment & Plan    1.  Coronary artery disease with recent unstable angina: Patient was recently admitted with chest pain and ruled out for MI. He had an abnormal stress test and underwent catheterization revealing severe mid LAD disease. This was successfully treated with a drug-eluting  stent. He has been enrolled in the twilight study and remains on aspirin and Brilinta in addition to Lipitor therapy. He has not been having any chest pain or dyspnea. He does plan to enroll in cardiac rehabilitation at [facility].  2. Dyslipidemia: He is new to statin therapy. LDL was 55 on November 29. We will follow up lipids and LFTs in another month.  3. Disposition: Patient wishes to follow-up with interventional cardiology and I will arrange for him to follow-up with Dr. Saunders Revel in 3 months or sooner if necessary.   Murray Hodgkins, NP 07/23/2016, 4:49 PM

## 2016-08-02 ENCOUNTER — Encounter: Payer: Medicare Other | Attending: Cardiovascular Disease | Admitting: *Deleted

## 2016-08-02 VITALS — Ht 73.8 in | Wt 231.0 lb

## 2016-08-02 DIAGNOSIS — Z955 Presence of coronary angioplasty implant and graft: Secondary | ICD-10-CM | POA: Diagnosis not present

## 2016-08-02 NOTE — Patient Instructions (Signed)
Patient Instructions  Patient Details  Name: Phillip [name] MRN: [id number] Date of Birth: 02/22/1951 Referring Provider:  Burnell Blanks*  Below are the personal goals you chose as well as exercise and nutrition goals. Our goal is to help you keep on track towards obtaining and maintaining your goals. We will be discussing your progress on these goals with you throughout the program.  Initial Exercise Prescription:     Initial Exercise Prescription - 08/02/16 1400      Date of Initial Exercise RX and Referring Provider   Date 08/02/16   Referring Provider End, Harrell Gave MD     Treadmill   MPH 2.5   Grade 1   Minutes 15   METs 3.26     Elliptical   Level 1   Speed 3.3   Minutes 15     REL-XR   Level 4   Watts --  speed > 50 spm   Minutes 15   METs 2     Prescription Details   Frequency (times per week) 3   Duration Progress to 45 minutes of aerobic exercise without signs/symptoms of physical distress     Intensity   THRR 40-80% of Max Heartrate 114-141   Ratings of Perceived Exertion 11-15   Perceived Dyspnea 0-4     Progression   Progression Continue to progress workloads to maintain intensity without signs/symptoms of physical distress.     Resistance Training   Training Prescription Yes   Weight 4 lbs   Reps 10-12      Exercise Goals: Frequency: Be able to perform aerobic exercise three times per week working toward 3-5 days per week.  Intensity: Work with a perceived exertion of 11 (fairly light) - 15 (hard) as tolerated. Follow your new exercise prescription and watch for changes in prescription as you progress with the program. Changes will be reviewed with you when they are made.  Duration: You should be able to do 30 minutes of continuous aerobic exercise in addition to a 5 minute warm-up and a 5 minute cool-down routine.  Nutrition Goals: Your personal nutrition goals will be established when you do your nutrition analysis with the  dietician.  The following are nutrition guidelines to follow: Cholesterol < 200mg /day Sodium < 1500mg /day Fiber: Men over 50 yrs - 30 grams per day  Personal Goals:     Personal Goals and Risk Factors at Admission - 08/02/16 1415      Core Components/Risk Factors/Patient Goals on Admission    Weight Management Yes;Obesity;Weight Maintenance   Intervention Weight Management: Develop a combined nutrition and exercise program designed to reach desired caloric intake, while maintaining appropriate intake of nutrient and fiber, sodium and fats, and appropriate energy expenditure required for the weight goal.   Admit Weight 231 lb (104.8 kg)   Goal Weight: Short Term 228 lb (103.4 kg)   Goal Weight: Long Term 210 lb (95.3 kg)   Expected Outcomes Short Term: Continue to assess and modify interventions until short term weight is achieved;Long Term: Adherence to nutrition and physical activity/exercise program aimed toward attainment of established weight goal;Weight Loss: Understanding of general recommendations for a balanced deficit meal plan, which promotes 1-2 lb weight loss per week and includes a negative energy balance of [phone] kcal/d   Sedentary Yes   Intervention Provide advice, education, support and counseling about physical activity/exercise needs.;Develop an individualized exercise prescription for aerobic and resistive training based on initial evaluation findings, risk stratification, comorbidities and participant's personal goals.  Expected Outcomes Achievement of increased cardiorespiratory fitness and enhanced flexibility, muscular endurance and strength shown through measurements of functional capacity and personal statement of participant.   Increase Strength and Stamina Yes   Intervention Provide advice, education, support and counseling about physical activity/exercise needs.;Develop an individualized exercise prescription for aerobic and resistive training based on initial  evaluation findings, risk stratification, comorbidities and participant's personal goals.   Expected Outcomes Achievement of increased cardiorespiratory fitness and enhanced flexibility, muscular endurance and strength shown through measurements of functional capacity and personal statement of participant.   Lipids Yes   Intervention Provide education and support for participant on nutrition & aerobic/resistive exercise along with prescribed medications to achieve LDL 70mg , HDL >40mg .   Expected Outcomes Short Term: Participant states understanding of desired cholesterol values and is compliant with medications prescribed. Participant is following exercise prescription and nutrition guidelines.;Long Term: Cholesterol controlled with medications as prescribed, with individualized exercise RX and with personalized nutrition plan. Value goals: LDL < 70mg , HDL > 40 mg.   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 Teach and support self-help strategies.;Refer participants experiencing significant psychosocial distress to appropriate mental health specialists for further evaluation and treatment. When possible, include family members and significant others in education/counseling sessions.   Expected Outcomes Short Term: Participant demonstrates changes in health-related behavior, relaxation and other stress management skills, ability to obtain effective social support, and compliance with psychotropic medications if prescribed.;Long Term: Emotional wellbeing is indicated by absence of clinically significant psychosocial distress or social isolation.      Tobacco Use Initial Evaluation: History  Smoking Status  . Never Smoker  Smokeless Tobacco  . Never Used    Copy of goals given to  participant.

## 2016-08-02 NOTE — Progress Notes (Signed)
Cardiac Individual Treatment Plan  Patient Details  Name: Phillip [name] MRN: [id number] Date of Birth: [birth year] Referring Provider:   Flowsheet Row Cardiac Rehab from 08/02/2016 in [facility] Cardiac and Pulmonary Rehab  Referring Provider  End, Harrell Gave MD      Initial Encounter Date:  Flowsheet Row Cardiac Rehab from 08/02/2016 in [facility] Cardiac and Pulmonary Rehab  Date  08/02/16  Referring Provider  End, Harrell Gave MD      Visit Diagnosis: Status post coronary artery stent placement  Patient's Home Medications on Admission:  Current Outpatient Prescriptions:  .  albuterol (PROVENTIL HFA;VENTOLIN HFA) 108 (90 Base) MCG/ACT inhaler, Inhale 2 puffs into the lungs every 6 (six) hours as needed for wheezing or shortness of breath., Disp: 1 Inhaler, Rfl: 0 .  AMBULATORY NON FORMULARY MEDICATION, Take 90 mg by mouth 2 (two) times daily. Medication Name: BRILINTA 90 MG bid (TWILIGHT Research Study PROVIDED), Disp: , Rfl:  .  AMBULATORY NON FORMULARY MEDICATION, Take 81 mg by mouth daily. Medication Name: ASPIRIN 81 mg Daily (TWILIGHT Research study PROVIDED), Disp: , Rfl:  .  Ascorbic Acid (VITAMIN C PO), Take 1 tablet by mouth daily., Disp: , Rfl:  .  atorvastatin (LIPITOR) 20 MG tablet, Take 1 tablet (20 mg total) by mouth daily at 6 PM., Disp: 30 tablet, Rfl: 6 .  folic acid (FOLVITE) Q000111Q MCG tablet, Take 400 mcg by mouth daily., Disp: , Rfl:  .  Multiple Vitamins-Minerals (ZINC PO), Take 1 tablet by mouth daily., Disp: , Rfl:  .  nitroGLYCERIN (NITROSTAT) 0.4 MG SL tablet, Place 1 tablet (0.4 mg total) under the tongue every 5 (five) minutes as needed for chest pain. Max: 3 doses., Disp: 30 tablet, Rfl: 0 .  omeprazole (PRILOSEC) 40 MG capsule, Take 40 mg by mouth daily. , Disp: , Rfl:  .  saw palmetto 80 MG capsule, Take 80 mg by mouth daily. , Disp: , Rfl:  .  vitamin B-12 500 MCG tablet, Take 1 tablet (500 mcg total) by mouth daily., Disp: 30 tablet, Rfl: 0  Past Medical  History: Past Medical History:  Diagnosis Date  . Acute bronchitis 10/11/2015  . Allergic rhinitis   . Arthritis   . CAD (coronary artery disease)    a. 06/2016 Echo: EF 55-60%, no rwma, mild AI, triv MR/TR/PR, lipomatous hypertrophy of atrial septum;  b. 06/2016 Ex MV: EF 56%, large, partially reversible anteroseptal, inferoseptal, inferior,and apical perfusion defect - borderline for ischemia (TID 1.64) - equivocal study;  c. 06/2016 PCI: LM nl, LAD 25p/54m (3.0x24 Promus Premier MR DES), LCX min irregs, RCA nl.  . Colon polyps    a. Severe dysplasia in a right-sided polyp removed surgically in [place], 2006--> final pathology showed this was not a cancer.  Follow up colonoscopy at one year interval was normal.  . Erectile dysfunction   . GERD (gastroesophageal reflux disease)    H/o Barrett's esophagus, orginally diagnosed in 2006, biopsies showed no dysplasia; follow up EGD also showed no dysplasia  . History of BPH   . Lyme disease    x2  . Prostate infection     Tobacco Use: History  Smoking Status  . Never Smoker  Smokeless Tobacco  . Never Used    Labs: Recent Review Flowsheet Data    Labs for ITP Cardiac and Pulmonary Rehab Latest Ref Rng & Units 12/02/2008 02/05/2013 07/21/2015 07/14/2016   Cholestrol 0 - 200 mg/dL 118 128 105 102   LDLCALC 0 - 99 mg/dL 77 71  56 55   HDL >40 mg/dL 21.30(L) 24.00(L) 25.10(L) 18(L)   Trlycerides <150 mg/dL 98.0 165.0(H) 121.0 147   Hemoglobin A1c 4.8 - 5.6 % - - - 5.6       Exercise Target Goals: Date: 08/02/16  Exercise Program Goal: Individual exercise prescription set with THRR, safety & activity barriers. Participant demonstrates ability to understand and report RPE using BORG scale, to self-measure pulse accurately, and to acknowledge the importance of the exercise prescription.  Exercise Prescription Goal: Starting with aerobic activity 30 plus minutes a day, 3 days per week for initial exercise prescription. Provide home  exercise prescription and guidelines that participant acknowledges understanding prior to discharge.  Activity Barriers & Risk Stratification:     Activity Barriers & Cardiac Risk Stratification - 08/02/16 1421      Activity Barriers & Cardiac Risk Stratification   Activity Barriers Arthritis;Back Problems;Joint Problems  mild arthritis hands, history torn cartiledge knee, back surgery 1992, some disc issues. Does not lift over 50 lb   Cardiac Risk Stratification High      6 Minute Walk:     6 Minute Walk    Row Name 08/02/16 1433         6 Minute Walk   Phase Initial     Distance 1468 feet     Walk Time 6 minutes     # of Rest Breaks 0     MPH 2.78     METS 3.55     RPE 12     VO2 Peak 12.44     Symptoms No     Resting HR 87 bpm     Resting BP 126/64     Max Ex. HR 120 bpm     Max Ex. BP 126/70     2 Minute Post BP 112/60        Initial Exercise Prescription:     Initial Exercise Prescription - 08/02/16 1400      Date of Initial Exercise RX and Referring Provider   Date 08/02/16   Referring Provider End, Harrell Gave MD     Treadmill   MPH 2.5   Grade 1   Minutes 15   METs 3.26     Elliptical   Level 1   Speed 3.3   Minutes 15     REL-XR   Level 4   Watts --  speed > 50 spm   Minutes 15   METs 2     Prescription Details   Frequency (times per week) 3   Duration Progress to 45 minutes of aerobic exercise without signs/symptoms of physical distress     Intensity   THRR 40-80% of Max Heartrate 114-141   Ratings of Perceived Exertion 11-15   Perceived Dyspnea 0-4     Progression   Progression Continue to progress workloads to maintain intensity without signs/symptoms of physical distress.     Resistance Training   Training Prescription Yes   Weight 4 lbs   Reps 10-12      Perform Capillary Blood Glucose checks as needed.  Exercise Prescription Changes:     Exercise Prescription Changes    Row Name 08/02/16 1400              Exercise Review   Progression -  walk test results         Response to Exercise   Blood Pressure (Admit) 126/64       Blood Pressure (Exercise) 126/70       Blood  Pressure (Exit) 112/60       Heart Rate (Admit) 87 bpm       Heart Rate (Exercise) 120 bpm       Heart Rate (Exit) 64 bpm       Oxygen Saturation (Admit) 100 %       Oxygen Saturation (Exercise) 100 %       Rating of Perceived Exertion (Exercise) 12       Symptoms none          Exercise Comments:     Exercise Comments    Row Name 08/02/16 1440           Exercise Comments Elta Guadeloupe wants to increase his stamina and energy.  He also wants to lose weight with a goal of 210 lbs.          Discharge Exercise Prescription (Final Exercise Prescription Changes):     Exercise Prescription Changes - 08/02/16 1400      Exercise Review   Progression --  walk test results     Response to Exercise   Blood Pressure (Admit) 126/64   Blood Pressure (Exercise) 126/70   Blood Pressure (Exit) 112/60   Heart Rate (Admit) 87 bpm   Heart Rate (Exercise) 120 bpm   Heart Rate (Exit) 64 bpm   Oxygen Saturation (Admit) 100 %   Oxygen Saturation (Exercise) 100 %   Rating of Perceived Exertion (Exercise) 12   Symptoms none      Nutrition:  Target Goals: Understanding of nutrition guidelines, daily intake of sodium 1500mg , cholesterol 200mg , calories 30% from fat and 7% or less from saturated fats, daily to have 5 or more servings of fruits and vegetables.  Biometrics:     Pre Biometrics - 08/02/16 1443      Pre Biometrics   Height 6' 1.8" (1.875 m)   Weight 231 lb (104.8 kg)   Waist Circumference 43 inches   Hip Circumference 43 inches   Waist to Hip Ratio 1 %   BMI (Calculated) 29.9   Single Leg Stand 7.85 seconds       Nutrition Therapy Plan and Nutrition Goals:     Nutrition Therapy & Goals - 08/02/16 1415      Intervention Plan   Intervention Prescribe, educate and counsel regarding individualized specific  dietary modifications aiming towards targeted core components such as weight, hypertension, lipid management, diabetes, heart failure and other comorbidities.   Expected Outcomes Short Term Goal: Understand basic principles of dietary content, such as calories, fat, sodium, cholesterol and nutrients.;Short Term Goal: A plan has been developed with personal nutrition goals set during dietitian appointment.;Long Term Goal: Adherence to prescribed nutrition plan.      Nutrition Discharge: Rate Your Plate Scores:     Nutrition Assessments - 08/02/16 1415      Rate Your Plate Scores   Pre Score 60   Pre Score % 66.7 %      Nutrition Goals Re-Evaluation:   Psychosocial: Target Goals: Acknowledge presence or absence of depression, maximize coping skills, provide positive support system. Participant is able to verbalize types and ability to use techniques and skills needed for reducing stress and depression.  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 Yes  wife and friends     Barriers   Psychosocial barriers to participate in program There are no identifiable barriers or psychosocial needs.;The patient should benefit from training in stress management and relaxation.     Screening Interventions   Interventions Encouraged to exercise      Quality of Life Scores:     Quality of Life - 08/02/16 1420      Quality of Life Scores   Health/Function Pre 26.3 %   Socioeconomic Pre 30 %   Psych/Spiritual Pre 27.21 %   Family Pre 28.8 %   GLOBAL Pre 27.62 %      PHQ-9: Recent Review Flowsheet Data    Depression screen [facility] 2/9 08/02/2016   Decreased Interest 0   Down, Depressed, Hopeless 0   PHQ - 2 Score 0   Altered sleeping 0   Tired,  decreased energy 1   Change in appetite 0   Feeling bad or failure about yourself  0   Trouble concentrating 0   Moving slowly or fidgety/restless 0   Suicidal thoughts 0   PHQ-9 Score 1   Difficult doing work/chores Not difficult at all      Psychosocial Evaluation and Intervention:   Psychosocial Re-Evaluation:   Vocational Rehabilitation: Provide vocational rehab assistance to qualifying candidates.   Vocational Rehab Evaluation & Intervention:     Vocational Rehab - 08/02/16 1424      Initial Vocational Rehab Evaluation & Intervention   Assessment shows need for Vocational Rehabilitation No      Education: Education Goals: Education classes will be provided on a weekly basis, covering required topics. Participant will state understanding/return demonstration of topics presented.  Learning Barriers/Preferences:     Learning Barriers/Preferences - 08/02/16 1441      Learning Barriers/Preferences   Learning Barriers None   Learning Preferences None      Education Topics: General Nutrition Guidelines/Fats and Fiber: -Group instruction provided by verbal, written material, models and posters to present the general guidelines for heart healthy nutrition. Gives an explanation and review of dietary fats and fiber.   Controlling Sodium/Reading Food Labels: -Group verbal and written material supporting the discussion of sodium use in heart healthy nutrition. Review and explanation with models, verbal and written materials for utilization of the food label.   Exercise Physiology & Risk Factors: - Group verbal and written instruction with models to review the exercise physiology of the cardiovascular system and associated critical values. Details cardiovascular disease risk factors and the goals associated with each risk factor.   Aerobic Exercise & Resistance Training: - Gives group verbal and written discussion on the health impact of inactivity. On the components  of aerobic and resistive training programs and the benefits of this training and how to safely progress through these programs.   Flexibility, Balance, General Exercise Guidelines: - Provides group verbal and written instruction on the benefits of flexibility and balance training programs. Provides general exercise guidelines with specific guidelines to those with heart or lung disease. Demonstration and skill practice provided.   Stress Management: - Provides group verbal and written instruction about the health risks of elevated stress, cause of high stress, and healthy ways to reduce stress.   Depression: - Provides group verbal and written instruction on the correlation between heart/lung disease and depressed mood, treatment options, and the stigmas associated with seeking treatment.   Anatomy & Physiology of the Heart: - Group verbal and written instruction and models provide basic cardiac anatomy  and physiology, with the coronary electrical and arterial systems. Review of: AMI, Angina, Valve disease, Heart Failure, Cardiac Arrhythmia, Pacemakers, and the ICD.   Cardiac Procedures: - Group verbal and written instruction and models to describe the testing methods done to diagnose heart disease. Reviews the outcomes of the test results. Describes the treatment choices: Medical Management, Angioplasty, or Coronary Bypass Surgery.   Cardiac Medications: - Group verbal and written instruction to review commonly prescribed medications for heart disease. Reviews the medication, class of the drug, and side effects. Includes the steps to properly store meds and maintain the prescription regimen.   Go Sex-Intimacy & Heart Disease, Get SMART - Goal Setting: - Group verbal and written instruction through game format to discuss heart disease and the return to sexual intimacy. Provides group verbal and written material to discuss and apply goal setting through the application of the S.M.A.R.T.  Method.   Other Matters of the Heart: - Provides group verbal, written materials and models to describe Heart Failure, Angina, Valve Disease, and Diabetes in the realm of heart disease. Includes description of the disease process and treatment options available to the cardiac patient.   Exercise & Equipment Safety: - Individual verbal instruction and demonstration of equipment use and safety with use of the equipment. Flowsheet Row Cardiac Rehab from 08/02/2016 in [facility] Cardiac and Pulmonary Rehab  Date  08/02/16  Educator  Sb  Instruction Review Code  2- meets goals/outcomes      Infection Prevention: - Provides verbal and written material to individual with discussion of infection control including proper hand washing and proper equipment cleaning during exercise session. Flowsheet Row Cardiac Rehab from 08/02/2016 in [facility] Cardiac and Pulmonary Rehab  Date  08/02/16  Educator  Sb  Instruction Review Code  2- meets goals/outcomes      Falls Prevention: - Provides verbal and written material to individual with discussion of falls prevention and safety. Flowsheet Row Cardiac Rehab from 08/02/2016 in [facility] Cardiac and Pulmonary Rehab  Date  08/02/16  Educator  Sb  Instruction Review Code  2- meets goals/outcomes      Diabetes: - Individual verbal and written instruction to review signs/symptoms of diabetes, desired ranges of glucose level fasting, after meals and with exercise. Advice that pre and post exercise glucose checks will be done for 3 sessions at entry of program.    Knowledge Questionnaire Score:     Knowledge Questionnaire Score - 08/02/16 1423      Knowledge Questionnaire Score   Pre Score 26/28  reviewed responses      Core Components/Risk Factors/Patient Goals at Admission:     Personal Goals and Risk Factors at Admission - 08/02/16 1415      Core Components/Risk Factors/Patient Goals on Admission    Weight Management Yes;Obesity;Weight Maintenance    Intervention Weight Management: Develop a combined nutrition and exercise program designed to reach desired caloric intake, while maintaining appropriate intake of nutrient and fiber, sodium and fats, and appropriate energy expenditure required for the weight goal.   Admit Weight 231 lb (104.8 kg)   Goal Weight: Short Term 228 lb (103.4 kg)   Goal Weight: Long Term 210 lb (95.3 kg)   Expected Outcomes Short Term: Continue to assess and modify interventions until short term weight is achieved;Long Term: Adherence to nutrition and physical activity/exercise program aimed toward attainment of established weight goal;Weight Loss: Understanding of general recommendations for a balanced deficit meal plan, which promotes 1-2 lb weight loss per week and includes a  negative energy balance of [phone] kcal/d   Sedentary Yes   Intervention Provide advice, education, support and counseling about physical activity/exercise needs.;Develop an individualized exercise prescription for aerobic and resistive training based on initial evaluation findings, risk stratification, comorbidities and participant's personal goals.   Expected Outcomes Achievement of increased cardiorespiratory fitness and enhanced flexibility, muscular endurance and strength shown through measurements of functional capacity and personal statement of participant.   Increase Strength and Stamina Yes   Intervention Provide advice, education, support and counseling about physical activity/exercise needs.;Develop an individualized exercise prescription for aerobic and resistive training based on initial evaluation findings, risk stratification, comorbidities and participant's personal goals.   Expected Outcomes Achievement of increased cardiorespiratory fitness and enhanced flexibility, muscular endurance and strength shown through measurements of functional capacity and personal statement of participant.   Lipids Yes   Intervention Provide education  and support for participant on nutrition & aerobic/resistive exercise along with prescribed medications to achieve LDL 70mg , HDL >40mg .   Expected Outcomes Short Term: Participant states understanding of desired cholesterol values and is compliant with medications prescribed. Participant is following exercise prescription and nutrition guidelines.;Long Term: Cholesterol controlled with medications as prescribed, with individualized exercise RX and with personalized nutrition plan. Value goals: LDL < 70mg , HDL > 40 mg.   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 Teach and support self-help strategies.;Refer participants experiencing significant psychosocial distress to appropriate mental health specialists for further evaluation and treatment. When possible, include family members and significant others in education/counseling sessions.   Expected Outcomes Short Term: Participant demonstrates changes in health-related behavior, relaxation and other stress management skills, ability to obtain effective social support, and compliance with psychotropic medications if prescribed.;Long Term: Emotional wellbeing is indicated by absence of clinically significant psychosocial distress or social isolation.      Core Components/Risk Factors/Patient Goals Review:    Core Components/Risk Factors/Patient Goals at Discharge (Final Review):    ITP Comments:     ITP Comments    Row Name 08/02/16 1410           ITP Comments Medical review completed, Initial ITP created. Diagnosis documentation can be found [facility] Encounter 07/12/2016          Comments: Initial ITP

## 2016-08-04 DIAGNOSIS — Z955 Presence of coronary angioplasty implant and graft: Secondary | ICD-10-CM | POA: Diagnosis not present

## 2016-08-04 NOTE — Progress Notes (Signed)
Daily Session Note  Patient Details  Name: Denim Kalmbach MRN: [id number] Date of Birth: 1950-10-04 Referring Provider:   Flowsheet Row Cardiac Rehab from 08/02/2016 in [facility] Cardiac and Pulmonary Rehab  Referring Provider  End, Harrell Gave MD      Encounter Date: 08/04/2016  Check In:     Session Check In - 08/04/16 1631      Check-In   Location [facility]-Cardiac & Pulmonary Rehab   Staff Present Nyoka Cowden, RN, BSN, MA;Yasaman Kolek, RN, BSN, Lance Sell, BA, ACSM CEP, Exercise Physiologist   Supervising physician immediately available to respond to emergencies See telemetry face sheet for immediately available ER MD   Medication changes reported     No   Fall or balance concerns reported    No   Warm-up and Cool-down Performed on first and last piece of equipment   Resistance Training Performed Yes   VAD Patient? No     Pain Assessment   Currently in Pain? No/denies         Goals Met:  Exercise tolerated well Personal goals reviewed No report of cardiac concerns or symptoms Strength training completed today  Goals Unmet:  Not Applicable  Comments: First full day of exercise!  Patient was oriented to gym and equipment including functions, settings, policies, and procedures.  Patient's individual exercise prescription and treatment plan were reviewed.  All starting workloads were established based on the results of the 6 minute walk test done at initial orientation visit.  The plan for exercise progression was also introduced and progression will be customized based on patient's performance and goals.    Dr. Emily Filbert is Medical Director for [place] and [facility].

## 2016-08-05 DIAGNOSIS — Z955 Presence of coronary angioplasty implant and graft: Secondary | ICD-10-CM | POA: Diagnosis not present

## 2016-08-05 NOTE — Progress Notes (Signed)
Daily Session Note  Patient Details  Name: Phillip [name] MRN: [id number] Date of Birth: 10/25/1950 Referring Provider:   Flowsheet Row Cardiac Rehab from 08/02/2016 in [facility] Cardiac and Pulmonary Rehab  Referring Provider  End, Harrell Gave MD      Encounter Date: 08/05/2016  Check In:     Session Check In - 08/05/16 1708      Check-In   Location [facility]-Cardiac & Pulmonary Rehab   Staff Present Levell July RN Vickki Hearing, BA, ACSM CEP, Exercise Physiologist;Kelly Amedeo Plenty, BS, ACSM CEP, Exercise Physiologist   Supervising physician immediately available to respond to emergencies See telemetry face sheet for immediately available ER MD   Medication changes reported     No   Fall or balance concerns reported    No   Warm-up and Cool-down Performed on first and last piece of equipment   Resistance Training Performed Yes   VAD Patient? No     Pain Assessment   Currently in Pain? No/denies         Goals Met:  Independence with exercise equipment Exercise tolerated well No report of cardiac concerns or symptoms Strength training completed today  Goals Unmet:  Not Applicable  Comments: Pt able to follow exercise prescription today without complaint.  Will continue to monitor for progression.    Dr. Emily Filbert is Medical Director for [place] and [facility].

## 2016-08-06 ENCOUNTER — Telehealth: Payer: Self-pay | Admitting: *Deleted

## 2016-08-06 ENCOUNTER — Encounter: Payer: Self-pay | Admitting: *Deleted

## 2016-08-06 DIAGNOSIS — Z006 Encounter for examination for normal comparison and control in clinical research program: Secondary | ICD-10-CM

## 2016-08-06 NOTE — Progress Notes (Signed)
TWILIGHT Research study month 1 telephone follow up completed. Patient denies any bleeding or other adverse events. He states he has been compliant with medication. Next research required visit is due no later than 13/MAR/2018. He will be randomized to ASA 81 mg or Placebo and continue Brilinta at that visit. Questions encouraged and answered.

## 2016-08-06 NOTE — Telephone Encounter (Signed)
Left message for patient to call research office for month 1 TWILIGHT Research telephone follow up visit.

## 2016-08-14 ENCOUNTER — Encounter: Payer: Self-pay | Admitting: Internal Medicine

## 2016-08-14 ENCOUNTER — Ambulatory Visit (INDEPENDENT_AMBULATORY_CARE_PROVIDER_SITE_OTHER): Payer: Medicare Other | Admitting: Internal Medicine

## 2016-08-14 VITALS — BP 140/80 | HR 70 | Temp 98.8°F | Resp 20 | Wt 223.0 lb

## 2016-08-14 DIAGNOSIS — R051 Acute cough: Secondary | ICD-10-CM | POA: Insufficient documentation

## 2016-08-14 DIAGNOSIS — R059 Cough, unspecified: Secondary | ICD-10-CM

## 2016-08-14 DIAGNOSIS — I200 Unstable angina: Secondary | ICD-10-CM

## 2016-08-14 DIAGNOSIS — R05 Cough: Secondary | ICD-10-CM

## 2016-08-14 LAB — POCT INFLUENZA A/B
INFLUENZA A, POC: NEGATIVE
INFLUENZA B, POC: NEGATIVE

## 2016-08-14 MED ORDER — AZITHROMYCIN 250 MG PO TABS: ORAL_TABLET | ORAL | 1 refills | Status: DC

## 2016-08-14 MED ORDER — HYDROCODONE-HOMATROPINE 5-1.5 MG/5ML PO SYRP: 5.0000 mL | ORAL_SOLUTION | Freq: Four times a day (QID) | ORAL | 0 refills | Status: AC | PRN

## 2016-08-14 NOTE — Progress Notes (Signed)
Pre visit review using our clinic review tool, if applicable. No additional management support is needed unless otherwise documented below in the visit note. 

## 2016-08-14 NOTE — Progress Notes (Signed)
Subjective:    Patient ID: Phillip [name], male    DOB: 15-Apr-1951, 65 y.o.   MRN: [id number]  HPI    Here with acute onset mild to mod 2-3 days ST, HA, general weakness and malaise, with prod cough greenish sputum, but Pt denies chest pain, increased sob or doe, wheezing, orthopnea, PND, increased LE swelling, palpitations, dizziness or syncope.   No other new hx Past Medical History:  Diagnosis Date  . Acute bronchitis 10/11/2015  . Allergic rhinitis   . Arthritis   . CAD (coronary artery disease)    a. 06/2016 Echo: EF 55-60%, no rwma, mild AI, triv MR/TR/PR, lipomatous hypertrophy of atrial septum;  b. 06/2016 Ex MV: EF 56%, large, partially reversible anteroseptal, inferoseptal, inferior,and apical perfusion defect - borderline for ischemia (TID 1.64) - equivocal study;  c. 06/2016 PCI: LM nl, LAD 25p/14m (3.0x24 Promus Premier MR DES), LCX min irregs, RCA nl.  . Colon polyps    a. Severe dysplasia in a right-sided polyp removed surgically in [place], 2006--> final pathology showed this was not a cancer.  Follow up colonoscopy at one year interval was normal.  . Erectile dysfunction   . GERD (gastroesophageal reflux disease)    H/o Barrett's esophagus, orginally diagnosed in 2006, biopsies showed no dysplasia; follow up EGD also showed no dysplasia  . History of BPH   . Lyme disease    x2  . Prostate infection    Past Surgical History:  Procedure Laterality Date  . CARDIAC CATHETERIZATION N/A 07/14/2016   Procedure: Left Heart Cath and Coronary Angiography;  Surgeon: Sherren Mocha, MD;  Location: [facility];  Service: Cardiovascular;  Laterality: N/A;  . CARDIAC CATHETERIZATION N/A 07/14/2016   Procedure: Coronary Stent Intervention;  Surgeon: Sherren Mocha, MD;  Location: [facility];  Service: Cardiovascular;  Laterality: N/A;  . COLONOSCOPY  12/2008   Repeat 5 years  . ESOPHAGOGASTRODUODENOSCOPY  12/2008   Barrett's, repeat 3 years  . HEMICOLECTOMY   2006   Right, for severely dysplastic right colon polyp  . HERNIA REPAIR  [id number]   UMBILICAL  . LUMBAR DISC SURGERY  1992   L4-5  . TONSILLECTOMY     As a child  . TUMOR REMOVAL  2002   Fatty tumor (neck)  . UPPER GASTROINTESTINAL ENDOSCOPY    . [place]   Removal, right    reports that he has never smoked. He has never used smokeless tobacco. He reports that he does not drink alcohol or use drugs. family history includes Alcohol abuse in his paternal grandfather; Diabetes in his father; Heart disease in his father; Hypertension in his father; Lymphoma in his mother. Allergies  Allergen Reactions  . Penicillins     REACTION: Rash as a child - Tolerated AMOXICILLIN SEVERAL TIMES AS ADULT RECENTLY Has patient had a PCN reaction causing immediate rash, facial/tongue/throat swelling, SOB or lightheadedness with hypotension: YES Has patient had a PCN reaction causing severe rash involving mucus membranes or skin necrosis:NO Has patient had a PCN reaction that required hospitalization NO Has patient had a PCN reaction occurring within the last 10 years: NO If all of the above answers are "NO", then may proceed with    Current Outpatient Prescriptions on File Prior to Visit  Medication Sig Dispense Refill  . albuterol (PROVENTIL HFA;VENTOLIN HFA) 108 (90 Base) MCG/ACT inhaler Inhale 2 puffs into the lungs every 6 (six) hours as needed for wheezing or shortness of breath.  1 Inhaler 0  . AMBULATORY NON FORMULARY MEDICATION Take 90 mg by mouth 2 (two) times daily. Medication Name: BRILINTA 90 MG bid (TWILIGHT Research Study PROVIDED)    . AMBULATORY NON FORMULARY MEDICATION Take 81 mg by mouth daily. Medication Name: ASPIRIN 81 mg Daily (TWILIGHT Research study PROVIDED)    . Ascorbic Acid (VITAMIN C PO) Take 1 tablet by mouth daily.    Marland Kitchen atorvastatin (LIPITOR) 20 MG tablet Take 1 tablet (20 mg total) by mouth daily at 6 PM. 30 tablet 6  . folic acid (FOLVITE) [id number] MCG tablet  Take 400 mcg by mouth daily.    . Multiple Vitamins-Minerals (ZINC PO) Take 1 tablet by mouth daily.    . nitroGLYCERIN (NITROSTAT) 0.4 MG SL tablet Place 1 tablet (0.4 mg total) under the tongue every 5 (five) minutes as needed for chest pain. Max: 3 doses. 30 tablet 0  . omeprazole (PRILOSEC) 40 MG capsule Take 40 mg by mouth daily.     . saw palmetto 80 MG capsule Take 80 mg by mouth daily.     . vitamin B-12 500 MCG tablet Take 1 tablet (500 mcg total) by mouth daily. 30 tablet 0   No current facility-administered medications on file prior to visit.    Review of Systems All otherwise neg per pt     Objective:   Physical Exam BP 140/80   Pulse 70   Temp 98.8 F (37.1 C) (Oral)   Resp 20   Wt 223 lb (101.2 kg)   SpO2 96%   BMI 28.79 kg/m  VS noted,  Constitutional: Pt appears in no apparent distress HENT: Head: NCAT.  Right Ear: External ear normal.  Left Ear: External ear normal.  Eyes: . Pupils are equal, round, and reactive to light. Conjunctivae and EOM are normal Neck: Normal range of motion. Neck supple.  Cardiovascular: Normal rate and regular rhythm.   Pulmonary/Chest: Effort normal and breath sounds without rales or wheezing.  Abd:  Soft, NT, ND, + BS Neurological: Pt is alert. Not confused , motor grossly intact Skin: Skin is warm. No rash, no LE edema Psychiatric: Pt behavior is normal. No agitation.  No other new exam findings  POCT - flu test - neg    Assessment & Plan:

## 2016-08-14 NOTE — Patient Instructions (Signed)
Please take all new medication as prescribed - the antibiotic, and cough medicine if needed  Please continue all other medications as before, and refills have been done if requested.  Please have the pharmacy call with any other refills you may need.  Please keep your appointments with your specialists as you may have planned     

## 2016-08-15 NOTE — Assessment & Plan Note (Addendum)
Mild to mod,c/w bronchitis vs pna, for antibx course, cough med prn, declines cxr, to f/u any worsening symptoms or concerns 

## 2016-08-17 ENCOUNTER — Encounter: Payer: Self-pay | Admitting: *Deleted

## 2016-08-17 DIAGNOSIS — Z955 Presence of coronary angioplasty implant and graft: Secondary | ICD-10-CM

## 2016-08-17 NOTE — Progress Notes (Signed)
Cardiac Individual Treatment Plan  Patient Details  Name: Kert Makosky MRN: [id number] Date of Birth: 1950/08/30 Referring Provider:   Flowsheet Row Cardiac Rehab from 08/02/2016 in [facility] Cardiac and Pulmonary Rehab  Referring Provider  End, Harrell Gave MD      Initial Encounter Date:  Flowsheet Row Cardiac Rehab from 08/02/2016 in [facility] Cardiac and Pulmonary Rehab  Date  08/02/16  Referring Provider  End, Harrell Gave MD      Visit Diagnosis: Status post coronary artery stent placement  Patient's Home Medications on Admission:  Current Outpatient Prescriptions:  .  albuterol (PROVENTIL HFA;VENTOLIN HFA) 108 (90 Base) MCG/ACT inhaler, Inhale 2 puffs into the lungs every 6 (six) hours as needed for wheezing or shortness of breath., Disp: 1 Inhaler, Rfl: 0 .  AMBULATORY NON FORMULARY MEDICATION, Take 90 mg by mouth 2 (two) times daily. Medication Name: BRILINTA 90 MG bid (TWILIGHT Research Study PROVIDED), Disp: , Rfl:  .  AMBULATORY NON FORMULARY MEDICATION, Take 81 mg by mouth daily. Medication Name: ASPIRIN 81 mg Daily (TWILIGHT Research study PROVIDED), Disp: , Rfl:  .  Ascorbic Acid (VITAMIN C PO), Take 1 tablet by mouth daily., Disp: , Rfl:  .  atorvastatin (LIPITOR) 20 MG tablet, Take 1 tablet (20 mg total) by mouth daily at 6 PM., Disp: 30 tablet, Rfl: 6 .  azithromycin (ZITHROMAX Z-PAK) 250 MG tablet, 2 tab by mouth day 1, then 1 per day, Disp: 6 tablet, Rfl: 1 .  folic acid (FOLVITE) Q000111Q MCG tablet, Take 400 mcg by mouth daily., Disp: , Rfl:  .  HYDROcodone-homatropine (HYCODAN) 5-1.5 MG/5ML syrup, Take 5 mLs by mouth every 6 (six) hours as needed for cough., Disp: 180 mL, Rfl: 0 .  Multiple Vitamins-Minerals (ZINC PO), Take 1 tablet by mouth daily., Disp: , Rfl:  .  nitroGLYCERIN (NITROSTAT) 0.4 MG SL tablet, Place 1 tablet (0.4 mg total) under the tongue every 5 (five) minutes as needed for chest pain. Max: 3 doses., Disp: 30 tablet, Rfl: 0 .  omeprazole (PRILOSEC) 40 MG  capsule, Take 40 mg by mouth daily. , Disp: , Rfl:  .  saw palmetto 80 MG capsule, Take 80 mg by mouth daily. , Disp: , Rfl:  .  vitamin B-12 500 MCG tablet, Take 1 tablet (500 mcg total) by mouth daily., Disp: 30 tablet, Rfl: 0  Past Medical History: Past Medical History:  Diagnosis Date  . Acute bronchitis 10/11/2015  . Allergic rhinitis   . Arthritis   . CAD (coronary artery disease)    a. 06/2016 Echo: EF 55-60%, no rwma, mild AI, triv MR/TR/PR, lipomatous hypertrophy of atrial septum;  b. 06/2016 Ex MV: EF 56%, large, partially reversible anteroseptal, inferoseptal, inferior,and apical perfusion defect - borderline for ischemia (TID 1.64) - equivocal study;  c. 06/2016 PCI: LM nl, LAD 25p/34m (3.0x24 Promus Premier MR DES), LCX min irregs, RCA nl.  . Colon polyps    a. Severe dysplasia in a right-sided polyp removed surgically in [place], 2006--> final pathology showed this was not a cancer.  Follow up colonoscopy at one year interval was normal.  . Erectile dysfunction   . GERD (gastroesophageal reflux disease)    H/o Barrett's esophagus, orginally diagnosed in 2006, biopsies showed no dysplasia; follow up EGD also showed no dysplasia  . History of BPH   . Lyme disease    x2  . Prostate infection     Tobacco Use: History  Smoking Status  . Never Smoker  Smokeless Tobacco  . Never  Used    Labs: Recent Merchant navy officer for ITP Cardiac and Pulmonary Rehab Latest Ref Rng & Units 12/02/2008 02/05/2013 07/21/2015 07/14/2016   Cholestrol 0 - 200 mg/dL 118 128 105 102   LDLCALC 0 - 99 mg/dL 77 71 56 55   HDL >40 mg/dL 21.30(L) 24.00(L) 25.10(L) 18(L)   Trlycerides <150 mg/dL 98.0 165.0(H) 121.0 147   Hemoglobin A1c 4.8 - 5.6 % - - - 5.6       Exercise Target Goals:    Exercise Program Goal: Individual exercise prescription set with THRR, safety & activity barriers. Participant demonstrates ability to understand and report RPE using BORG scale, to self-measure  pulse accurately, and to acknowledge the importance of the exercise prescription.  Exercise Prescription Goal: Starting with aerobic activity 30 plus minutes a day, 3 days per week for initial exercise prescription. Provide home exercise prescription and guidelines that participant acknowledges understanding prior to discharge.  Activity Barriers & Risk Stratification:     Activity Barriers & Cardiac Risk Stratification - 08/02/16 1421      Activity Barriers & Cardiac Risk Stratification   Activity Barriers Arthritis;Back Problems;Joint Problems  mild arthritis hands, history torn cartiledge knee, back surgery 1992, some disc issues. Does not lift over 50 lb   Cardiac Risk Stratification High      6 Minute Walk:     6 Minute Walk    Row Name 08/02/16 1433         6 Minute Walk   Phase Initial     Distance 1468 feet     Walk Time 6 minutes     # of Rest Breaks 0     MPH 2.78     METS 3.55     RPE 12     VO2 Peak 12.44     Symptoms No     Resting HR 87 bpm     Resting BP 126/64     Max Ex. HR 120 bpm     Max Ex. BP 126/70     2 Minute Post BP 112/60        Initial Exercise Prescription:     Initial Exercise Prescription - 08/02/16 1400      Date of Initial Exercise RX and Referring Provider   Date 08/02/16   Referring Provider End, Harrell Gave MD     Treadmill   MPH 2.5   Grade 1   Minutes 15   METs 3.26     Elliptical   Level 1   Speed 3.3   Minutes 15     REL-XR   Level 4   Watts --  speed > 50 spm   Minutes 15   METs 2     Prescription Details   Frequency (times per week) 3   Duration Progress to 45 minutes of aerobic exercise without signs/symptoms of physical distress     Intensity   THRR 40-80% of Max Heartrate 114-141   Ratings of Perceived Exertion 11-15   Perceived Dyspnea 0-4     Progression   Progression Continue to progress workloads to maintain intensity without signs/symptoms of physical distress.     Resistance Training    Training Prescription Yes   Weight 4 lbs   Reps 10-12      Perform Capillary Blood Glucose checks as needed.  Exercise Prescription Changes:     Exercise Prescription Changes    Row Name 08/02/16 1400 08/13/16 0900  Exercise Review   Progression -  walk test results Yes        Response to Exercise   Blood Pressure (Admit) 126/64 140/80      Blood Pressure (Exercise) 126/70 148/68      Blood Pressure (Exit) 112/60 112/72      Heart Rate (Admit) 87 bpm 75 bpm      Heart Rate (Exercise) 120 bpm 127 bpm      Heart Rate (Exit) 64 bpm 101 bpm      Oxygen Saturation (Admit) 100 %  -      Oxygen Saturation (Exercise) 100 %  -      Rating of Perceived Exertion (Exercise) 12 14      Symptoms none none      Duration  - Progress to 45 minutes of aerobic exercise without signs/symptoms of physical distress      Intensity  - THRR unchanged        Progression   Progression  - Continue to progress workloads to maintain intensity without signs/symptoms of physical distress.      Average METs  - 4.58        Resistance Training   Training Prescription  - Yes      Weight  - 4 lbs      Reps  - 10-12        Interval Training   Interval Training  - No        Treadmill   MPH  - 2.5      Grade  - 1      Minutes  - 15      METs  - 3.26        Elliptical   Level  - 1      Speed  - 3.3      Minutes  - 15        REL-XR   Level  - 4      Minutes  - 15      METs  - 5.9         Exercise Comments:     Exercise Comments    Row Name 08/02/16 1440 08/04/16 1746 08/13/16 0954       Exercise Comments Elta Guadeloupe wants to increase his stamina and energy.  He also wants to lose weight with a goal of 210 lbs.  First full day of exercise!  Patient was oriented to gym and equipment including functions, settings, policies, and procedures.  Patient's individual exercise prescription and treatment plan were reviewed.  All starting workloads were established based on the results of the 6  minute walk test done at initial orientation visit.  The plan for exercise progression was also introduced and progression will be customized based on patient's performance and goals. Elta Guadeloupe is off to a good start with rehab.  He is doing well and already making some progress.  We will continue to track his progression.        Discharge Exercise Prescription (Final Exercise Prescription Changes):     Exercise Prescription Changes - 08/13/16 0900      Exercise Review   Progression Yes     Response to Exercise   Blood Pressure (Admit) 140/80   Blood Pressure (Exercise) 148/68   Blood Pressure (Exit) 112/72   Heart Rate (Admit) 75 bpm   Heart Rate (Exercise) 127 bpm   Heart Rate (Exit) 101 bpm   Rating of Perceived Exertion (Exercise) 14   Symptoms none   Duration  Progress to 45 minutes of aerobic exercise without signs/symptoms of physical distress   Intensity THRR unchanged     Progression   Progression Continue to progress workloads to maintain intensity without signs/symptoms of physical distress.   Average METs 4.58     Resistance Training   Training Prescription Yes   Weight 4 lbs   Reps 10-12     Interval Training   Interval Training No     Treadmill   MPH 2.5   Grade 1   Minutes 15   METs 3.26     Elliptical   Level 1   Speed 3.3   Minutes 15     REL-XR   Level 4   Minutes 15   METs 5.9      Nutrition:  Target Goals: Understanding of nutrition guidelines, daily intake of sodium 1500mg , cholesterol 200mg , calories 30% from fat and 7% or less from saturated fats, daily to have 5 or more servings of fruits and vegetables.  Biometrics:     Pre Biometrics - 08/02/16 1443      Pre Biometrics   Height 6' 1.8" (1.875 m)   Weight 231 lb (104.8 kg)   Waist Circumference 43 inches   Hip Circumference 43 inches   Waist to Hip Ratio 1 %   BMI (Calculated) 29.9   Single Leg Stand 7.85 seconds       Nutrition Therapy Plan and Nutrition Goals:      Nutrition Therapy & Goals - 08/02/16 1415      Intervention Plan   Intervention Prescribe, educate and counsel regarding individualized specific dietary modifications aiming towards targeted core components such as weight, hypertension, lipid management, diabetes, heart failure and other comorbidities.   Expected Outcomes Short Term Goal: Understand basic principles of dietary content, such as calories, fat, sodium, cholesterol and nutrients.;Short Term Goal: A plan has been developed with personal nutrition goals set during dietitian appointment.;Long Term Goal: Adherence to prescribed nutrition plan.      Nutrition Discharge: Rate Your Plate Scores:     Nutrition Assessments - 08/02/16 1415      Rate Your Plate Scores   Pre Score 60   Pre Score % 66.7 %      Nutrition Goals Re-Evaluation:   Psychosocial: Target Goals: Acknowledge presence or absence of depression, maximize coping skills, provide positive support system. Participant is able to verbalize types and ability to use techniques and skills needed for reducing stress and depression.  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 Yes  wife and friends     Barriers   Psychosocial barriers to participate in program There are no identifiable barriers or psychosocial needs.;The patient should benefit from training in stress management and relaxation.     Screening Interventions   Interventions Encouraged to exercise      Quality of Life Scores:     Quality of Life - 08/02/16 1420      Quality of Life Scores   Health/Function Pre 26.3 %   Socioeconomic Pre 30 %   Psych/Spiritual Pre 27.21 %   Family Pre 28.8 %   GLOBAL Pre 27.62 %      PHQ-9: Recent Review Flowsheet Data  Depression screen [facility] 2/9 08/02/2016   Decreased Interest 0   Down, Depressed, Hopeless 0   PHQ - 2 Score 0   Altered sleeping 0   Tired, decreased energy 1   Change in appetite 0   Feeling bad or failure about yourself  0   Trouble concentrating 0   Moving slowly or fidgety/restless 0   Suicidal thoughts 0   PHQ-9 Score 1   Difficult doing work/chores Not difficult at all      Psychosocial Evaluation and Intervention:   Psychosocial Re-Evaluation:   Vocational Rehabilitation: Provide vocational rehab assistance to qualifying candidates.   Vocational Rehab Evaluation & Intervention:     Vocational Rehab - 08/02/16 1424      Initial Vocational Rehab Evaluation & Intervention   Assessment shows need for Vocational Rehabilitation No      Education: Education Goals: Education classes will be provided on a weekly basis, covering required topics. Participant will state understanding/return demonstration of topics presented.  Learning Barriers/Preferences:     Learning Barriers/Preferences - 08/02/16 1441      Learning Barriers/Preferences   Learning Barriers None   Learning Preferences None      Education Topics: General Nutrition Guidelines/Fats and Fiber: -Group instruction provided by verbal, written material, models and posters to present the general guidelines for heart healthy nutrition. Gives an explanation and review of dietary fats and fiber.   Controlling Sodium/Reading Food Labels: -Group verbal and written material supporting the discussion of sodium use in heart healthy nutrition. Review and explanation with models, verbal and written materials for utilization of the food label.   Exercise Physiology & Risk Factors: - Group verbal and written instruction with models to review the exercise physiology of the cardiovascular system and associated critical values. Details cardiovascular disease risk factors and the goals associated with each risk  factor.   Aerobic Exercise & Resistance Training: - Gives group verbal and written discussion on the health impact of inactivity. On the components of aerobic and resistive training programs and the benefits of this training and how to safely progress through these programs.   Flexibility, Balance, General Exercise Guidelines: - Provides group verbal and written instruction on the benefits of flexibility and balance training programs. Provides general exercise guidelines with specific guidelines to those with heart or lung disease. Demonstration and skill practice provided.   Stress Management: - Provides group verbal and written instruction about the health risks of elevated stress, cause of high stress, and healthy ways to reduce stress.   Depression: - Provides group verbal and written instruction on the correlation between heart/lung disease and depressed mood, treatment options, and the stigmas associated with seeking treatment.   Anatomy & Physiology of the Heart: - Group verbal and written instruction and models provide basic cardiac anatomy and physiology, with the coronary electrical and arterial systems. Review of: AMI, Angina, Valve disease, Heart Failure, Cardiac Arrhythmia, Pacemakers, and the ICD.   Cardiac Procedures: - Group verbal and written instruction and models to describe the testing methods done to diagnose heart disease. Reviews the outcomes of the test results. Describes the treatment choices: Medical Management, Angioplasty, or Coronary Bypass Surgery.   Cardiac Medications: - Group verbal and written instruction to review commonly prescribed medications for heart disease. Reviews the medication, class of the drug, and side effects. Includes the steps to properly store meds and maintain the prescription regimen. Flowsheet Row Cardiac Rehab from 08/04/2016 in [facility] Cardiac and Pulmonary Rehab  Date  08/04/16 [part 2]  Educator  SB  Instruction Review Code  2-  meets goals/outcomes      Go Sex-Intimacy & Heart Disease, Get SMART - Goal Setting: - Group verbal and written instruction through game format to discuss heart disease and the return to sexual intimacy. Provides group verbal and written material to discuss and apply goal setting through the application of the S.M.A.R.T. Method.   Other Matters of the Heart: - Provides group verbal, written materials and models to describe Heart Failure, Angina, Valve Disease, and Diabetes in the realm of heart disease. Includes description of the disease process and treatment options available to the cardiac patient.   Exercise & Equipment Safety: - Individual verbal instruction and demonstration of equipment use and safety with use of the equipment. Flowsheet Row Cardiac Rehab from 08/04/2016 in [facility] Cardiac and Pulmonary Rehab  Date  08/02/16  Educator  Sb  Instruction Review Code  2- meets goals/outcomes      Infection Prevention: - Provides verbal and written material to individual with discussion of infection control including proper hand washing and proper equipment cleaning during exercise session. Flowsheet Row Cardiac Rehab from 08/04/2016 in [facility] Cardiac and Pulmonary Rehab  Date  08/02/16  Educator  Sb  Instruction Review Code  2- meets goals/outcomes      Falls Prevention: - Provides verbal and written material to individual with discussion of falls prevention and safety. Flowsheet Row Cardiac Rehab from 08/04/2016 in [facility] Cardiac and Pulmonary Rehab  Date  08/02/16  Educator  Sb  Instruction Review Code  2- meets goals/outcomes      Diabetes: - Individual verbal and written instruction to review signs/symptoms of diabetes, desired ranges of glucose level fasting, after meals and with exercise. Advice that pre and post exercise glucose checks will be done for 3 sessions at entry of program.    Knowledge Questionnaire Score:     Knowledge Questionnaire Score - 08/02/16 1423       Knowledge Questionnaire Score   Pre Score 26/28  reviewed responses      Core Components/Risk Factors/Patient Goals at Admission:     Personal Goals and Risk Factors at Admission - 08/02/16 1415      Core Components/Risk Factors/Patient Goals on Admission    Weight Management Yes;Obesity;Weight Maintenance   Intervention Weight Management: Develop a combined nutrition and exercise program designed to reach desired caloric intake, while maintaining appropriate intake of nutrient and fiber, sodium and fats, and appropriate energy expenditure required for the weight goal.   Admit Weight 231 lb (104.8 kg)   Goal Weight: Short Term 228 lb (103.4 kg)   Goal Weight: Long Term 210 lb (95.3 kg)   Expected Outcomes Short Term: Continue to assess and modify interventions until short term weight is achieved;Long Term: Adherence to nutrition and physical activity/exercise program aimed toward attainment of established weight goal;Weight Loss: Understanding of general recommendations for a balanced deficit meal plan, which promotes 1-2 lb weight loss per week and includes a negative energy balance of [phone] kcal/d   Sedentary Yes   Intervention Provide advice, education, support and counseling about physical activity/exercise needs.;Develop an individualized exercise prescription for aerobic and resistive training based on initial evaluation findings, risk stratification, comorbidities and participant's personal goals.   Expected Outcomes Achievement of increased cardiorespiratory fitness and enhanced flexibility, muscular endurance and strength shown through measurements of functional capacity and personal statement of participant.   Increase Strength and Stamina Yes   Intervention Provide advice, education, support and counseling about physical  activity/exercise needs.;Develop an individualized exercise prescription for aerobic and resistive training based on initial evaluation findings, risk  stratification, comorbidities and participant's personal goals.   Expected Outcomes Achievement of increased cardiorespiratory fitness and enhanced flexibility, muscular endurance and strength shown through measurements of functional capacity and personal statement of participant.   Lipids Yes   Intervention Provide education and support for participant on nutrition & aerobic/resistive exercise along with prescribed medications to achieve LDL 70mg , HDL >40mg .   Expected Outcomes Short Term: Participant states understanding of desired cholesterol values and is compliant with medications prescribed. Participant is following exercise prescription and nutrition guidelines.;Long Term: Cholesterol controlled with medications as prescribed, with individualized exercise RX and with personalized nutrition plan. Value goals: LDL < 70mg , HDL > 40 mg.   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 Teach and support self-help strategies.;Refer participants experiencing significant psychosocial distress to appropriate mental health specialists for further evaluation and treatment. When possible, include family members and significant others in education/counseling sessions.   Expected Outcomes Short Term: Participant demonstrates changes in health-related behavior, relaxation and other stress management skills, ability to obtain effective social support, and compliance with psychotropic medications if prescribed.;Long Term: Emotional wellbeing is indicated by absence of clinically significant psychosocial distress or social isolation.      Core Components/Risk Factors/Patient Goals Review:    Core Components/Risk Factors/Patient Goals at Discharge (Final Review):    ITP Comments:     ITP Comments     Row Name 08/02/16 1410 08/04/16 1746 08/17/16 [id number]       ITP Comments Medical review completed, Initial ITP created. Diagnosis documentation can be found [facility] Encounter 07/12/2016  First full day of exercise!  Patient was oriented to gym and equipment including functions, settings, policies, and procedures.  Patient's individual exercise prescription and treatment plan were reviewed.  All starting workloads were established based on the results of the 6 minute walk test done at initial orientation visit.  The plan for exercise progression was also introduced and progression will be customized based on patient's performance and goals. 30 day review. Continue with ITP unless directed changes per Medical Director review.        Comments:

## 2016-08-18 ENCOUNTER — Encounter: Payer: Medicare Other | Attending: Cardiovascular Disease

## 2016-08-18 ENCOUNTER — Encounter: Payer: Medicare Other | Admitting: Gastroenterology

## 2016-08-18 ENCOUNTER — Telehealth: Payer: Self-pay

## 2016-08-18 DIAGNOSIS — Z955 Presence of coronary angioplasty implant and graft: Secondary | ICD-10-CM | POA: Insufficient documentation

## 2016-08-18 NOTE — Telephone Encounter (Signed)
Phillip [name] has been sick and will not attend class today.

## 2016-08-19 DIAGNOSIS — Z955 Presence of coronary angioplasty implant and graft: Secondary | ICD-10-CM

## 2016-08-19 NOTE — Progress Notes (Signed)
Daily Session Note  Patient Details  Name: Phillip [name] MRN: [id number] Date of Birth: February 10, 1951 Referring Provider:   Flowsheet Row Cardiac Rehab from 08/02/2016 in [facility] Cardiac and Pulmonary Rehab  Referring Provider  End, Harrell Gave MD      Encounter Date: 08/19/2016  Check In:     Session Check In - 08/19/16 1134      Check-In   Location [facility]-Cardiac & Pulmonary Rehab   Staff Present Alberteen Sam, MA, ACSM RCEP, Exercise Physiologist;Patricia Surles RN Vickki Hearing, BA, ACSM CEP, Exercise Physiologist   Supervising physician immediately available to respond to emergencies See telemetry face sheet for immediately available ER MD   Medication changes reported     No   Fall or balance concerns reported    No   Warm-up and Cool-down Performed on first and last piece of equipment   Resistance Training Performed Yes   VAD Patient? No     Pain Assessment   Currently in Pain? No/denies   Multiple Pain Sites No         Goals Met:  Independence with exercise equipment Exercise tolerated well No report of cardiac concerns or symptoms Strength training completed today  Goals Unmet:  Not Applicable  Comments: Pt able to follow exercise prescription today without complaint.  Will continue to monitor for progression.    Dr. Emily Filbert is Medical Director for [place] and [facility].

## 2016-08-23 ENCOUNTER — Encounter: Payer: Medicare Other | Admitting: *Deleted

## 2016-08-23 DIAGNOSIS — Z955 Presence of coronary angioplasty implant and graft: Secondary | ICD-10-CM | POA: Diagnosis not present

## 2016-08-23 NOTE — Progress Notes (Signed)
Daily Session Note  Patient Details  Name: Aztlan Coll MRN: [id number] Date of Birth: [birth year] Referring Provider:   Flowsheet Row Cardiac Rehab from 08/02/2016 in [facility] Cardiac and Pulmonary Rehab  Referring Provider  End, Harrell Gave MD      Encounter Date: 08/23/2016  Check In:     Session Check In - 08/23/16 1620      Check-In   Location [facility]-Cardiac & Pulmonary Rehab   Staff Present Gerlene Burdock, RN, BSN;Susanne Bice, RN, BSN, Laveda Norman, BS, ACSM CEP, Exercise Physiologist   Supervising physician immediately available to respond to emergencies See telemetry face sheet for immediately available ER MD   Medication changes reported     No   Fall or balance concerns reported    No   Warm-up and Cool-down Performed on first and last piece of equipment   Resistance Training Performed Yes   VAD Patient? No     Pain Assessment   Currently in Pain? No/denies         Goals Met:  Proper associated with RPD/PD & O2 Sat Exercise tolerated well  No c/o chest pain  Goals Unmet:  Not Applicable  Comments:     Dr. Emily Filbert is Medical Director for [place] and [facility].

## 2016-08-25 ENCOUNTER — Other Ambulatory Visit (INDEPENDENT_AMBULATORY_CARE_PROVIDER_SITE_OTHER): Payer: Medicare Other

## 2016-08-25 DIAGNOSIS — E785 Hyperlipidemia, unspecified: Secondary | ICD-10-CM

## 2016-08-25 DIAGNOSIS — I25119 Atherosclerotic heart disease of native coronary artery with unspecified angina pectoris: Secondary | ICD-10-CM

## 2016-08-25 DIAGNOSIS — Z955 Presence of coronary angioplasty implant and graft: Secondary | ICD-10-CM | POA: Diagnosis not present

## 2016-08-25 NOTE — Progress Notes (Signed)
Daily Session Note  Patient Details  Name: Phillip [name] MRN: [id number] Date of Birth: 07-May-1951 Referring Provider:   Flowsheet Row Cardiac Rehab from 08/02/2016 in [facility] Cardiac and Pulmonary Rehab  Referring Provider  End, Harrell Gave MD      Encounter Date: 08/25/2016  Check In:     Session Check In - 08/25/16 1630      Check-In   Location [facility]-Cardiac & Pulmonary Rehab   Staff Present Gerlene Burdock, RN, Vickki Hearing, BA, ACSM CEP, Exercise Physiologist;Patricia Surles RN BSN   Supervising physician immediately available to respond to emergencies See telemetry face sheet for immediately available ER MD   Medication changes reported     No   Fall or balance concerns reported    No   Warm-up and Cool-down Performed on first and last piece of equipment   Resistance Training Performed Yes   VAD Patient? No     Pain Assessment   Currently in Pain? No/denies   Multiple Pain Sites No         Goals Met:  Proper associated with RPD/PD & O2 Sat Independence with exercise equipment Exercise tolerated well Strength training completed today  Goals Unmet:  Not Applicable  Comments: Pt able to follow exercise prescription today without complaint.  Will continue to monitor for progression.    Dr. Emily Filbert is Medical Director for [place] and [facility].

## 2016-08-26 ENCOUNTER — Encounter: Payer: Medicare Other | Admitting: *Deleted

## 2016-08-26 ENCOUNTER — Other Ambulatory Visit: Payer: Self-pay | Admitting: *Deleted

## 2016-08-26 DIAGNOSIS — Z79899 Other long term (current) drug therapy: Secondary | ICD-10-CM

## 2016-08-26 DIAGNOSIS — Z955 Presence of coronary angioplasty implant and graft: Secondary | ICD-10-CM

## 2016-08-26 DIAGNOSIS — Z13220 Encounter for screening for lipoid disorders: Secondary | ICD-10-CM

## 2016-08-26 LAB — HEPATIC FUNCTION PANEL
ALBUMIN: 4.2 g/dL (ref 3.6–4.8)
ALT: 27 IU/L (ref 0–44)
AST: 24 IU/L (ref 0–40)
Alkaline Phosphatase: 119 IU/L — ABNORMAL HIGH (ref 39–117)
BILIRUBIN TOTAL: 0.6 mg/dL (ref 0.0–1.2)
Bilirubin, Direct: 0.15 mg/dL (ref 0.00–0.40)
Total Protein: 7 g/dL (ref 6.0–8.5)

## 2016-08-26 LAB — LIPID PANEL
Chol/HDL Ratio: 2.6 ratio units (ref 0.0–5.0)
Cholesterol, Total: 67 mg/dL — ABNORMAL LOW (ref 100–199)
HDL: 26 mg/dL — ABNORMAL LOW (ref >39)
LDL Calculated: 22 mg/dL (ref 0–99)
Triglycerides: 95 mg/dL (ref 0–149)
VLDL CHOLESTEROL CAL: 19 mg/dL (ref 5–40)

## 2016-08-26 NOTE — Progress Notes (Signed)
Daily Session Note  Patient Details  Name: Phillip [name] MRN: [id number] Date of Birth: 12/29/1950 Referring Provider:   Flowsheet Row Cardiac Rehab from 08/02/2016 in [facility] Cardiac and Pulmonary Rehab  Referring Provider  End, Christopher MD      Encounter Date: 08/26/2016  Check In:     Session Check In - 08/26/16 1622      Check-In   Location [facility]-Cardiac & Pulmonary Rehab   Staff Present Amanda Sommer, BA, ACSM CEP, Exercise Physiologist;Patricia Surles RN BSN;Kelly Hayes, BS, ACSM CEP, Exercise Physiologist   Supervising physician immediately available to respond to emergencies See telemetry face sheet for immediately available ER MD   Medication changes reported     No   Fall or balance concerns reported    No   Warm-up and Cool-down Performed on first and last piece of equipment   Resistance Training Performed Yes   VAD Patient? No     Pain Assessment   Currently in Pain? No/denies           Exercise Prescription Changes - 08/26/16 1200      Response to Exercise   Blood Pressure (Admit) 114/78   Blood Pressure (Exercise) 128/80   Blood Pressure (Exit) 106/68   Heart Rate (Admit) 76 bpm   Heart Rate (Exercise) 125 bpm   Heart Rate (Exit) 88 bpm   Rating of Perceived Exertion (Exercise) 13   Symptoms none   Duration Progress to 45 minutes of aerobic exercise without signs/symptoms of physical distress   Intensity THRR unchanged     Progression   Progression Continue to progress workloads to maintain intensity without signs/symptoms of physical distress.   Average METs 4.3     Resistance Training   Training Prescription Yes   Weight 4   Reps 10-12     Interval Training   Interval Training No     Elliptical   Level 1   Speed 3.3   Minutes 15     REL-XR   Level 4   Minutes 15   METs 4.3      Goals Met:  Proper associated with RPD/PD & O2 Sat Exercise tolerated well  Goals Unmet:  Not Applicable  Comments:     Dr. Mark Miller is  Medical Director for [facility] and [facility]. 

## 2016-08-30 ENCOUNTER — Encounter: Payer: Medicare Other | Admitting: *Deleted

## 2016-08-30 DIAGNOSIS — Z955 Presence of coronary angioplasty implant and graft: Secondary | ICD-10-CM

## 2016-08-30 NOTE — Progress Notes (Signed)
Daily Session Note  Patient Details  Name: Phillip [name] MRN: [id number] Date of Birth: 03-20-1951 Referring Provider:   Flowsheet Row Cardiac Rehab from 08/02/2016 in [facility] Cardiac and Pulmonary Rehab  Referring Provider  End, Harrell Gave MD      Encounter Date: 08/30/2016  Check In:     Session Check In - 08/30/16 1617      Check-In   Location [facility]-Cardiac & Pulmonary Rehab   Staff Present Gerlene Burdock, RN, Moises Blood, BS, ACSM CEP, Exercise Physiologist;Susanne Bice, RN, BSN, CCRP   Supervising physician immediately available to respond to emergencies See telemetry face sheet for immediately available ER MD   Medication changes reported     No   Fall or balance concerns reported    No   Warm-up and Cool-down Performed on first and last piece of equipment   Resistance Training Performed Yes   VAD Patient? No     Pain Assessment   Currently in Pain? No/denies   Multiple Pain Sites No         Goals Met:  Independence with exercise equipment Exercise tolerated well No report of cardiac concerns or symptoms Strength training completed today  Goals Unmet:  Not Applicable  Comments: Pt able to follow exercise prescription today without complaint.  Will continue to monitor for progression.    Dr. Emily Filbert is Medical Director for [place] and [facility].

## 2016-09-06 ENCOUNTER — Encounter: Payer: Medicare Other | Admitting: *Deleted

## 2016-09-06 DIAGNOSIS — Z955 Presence of coronary angioplasty implant and graft: Secondary | ICD-10-CM | POA: Diagnosis not present

## 2016-09-06 NOTE — Progress Notes (Signed)
Daily Session Note  Patient Details  Name: Rollin Kotowski MRN: [id number] Date of Birth: Aug 04, 1951 Referring Provider:   Flowsheet Row Cardiac Rehab from 08/02/2016 in [facility] Cardiac and Pulmonary Rehab  Referring Provider  End, Harrell Gave MD      Encounter Date: 09/06/2016  Check In:     Session Check In - 09/06/16 1626      Check-In   Staff Present Heath Lark, RN, BSN, CCRP;Carroll Enterkin, RN, Moises Blood, BS, ACSM CEP, Exercise Physiologist   Supervising physician immediately available to respond to emergencies See telemetry face sheet for immediately available ER MD   Medication changes reported     No   Fall or balance concerns reported    No   Warm-up and Cool-down Performed on first and last piece of equipment   Resistance Training Performed Yes   VAD Patient? No     Pain Assessment   Currently in Pain? No/denies         Goals Met:  Exercise tolerated well No report of cardiac concerns or symptoms Strength training completed today  Goals Unmet:  Not Applicable  Comments: Doing well with exercise prescription progression.    Dr. Emily Filbert is Medical Director for [place] and [facility].

## 2016-09-08 ENCOUNTER — Encounter: Payer: Medicare Other | Admitting: *Deleted

## 2016-09-08 DIAGNOSIS — Z955 Presence of coronary angioplasty implant and graft: Secondary | ICD-10-CM | POA: Diagnosis not present

## 2016-09-08 NOTE — Progress Notes (Signed)
Daily Session Note  Patient Details  Name: Phillip [name] MRN: [id number] Date of Birth: [birth year] Referring Provider:   Flowsheet Row Cardiac Rehab from 08/02/2016 in [facility] Cardiac and Pulmonary Rehab  Referring Provider  End, Harrell Gave MD      Encounter Date: 09/08/2016  Check In:     Session Check In - 09/08/16 1636      Check-In   Location [facility]-Cardiac & Pulmonary Rehab   Staff Present Gerlene Burdock, RN, Vickki Hearing, BA, ACSM CEP, Exercise Physiologist;Patricia Surles RN BSN   Supervising physician immediately available to respond to emergencies See telemetry face sheet for immediately available ER MD   Medication changes reported     No   Fall or balance concerns reported    No   Warm-up and Cool-down Performed on first and last piece of equipment   Resistance Training Performed Yes   VAD Patient? No     Pain Assessment   Currently in Pain? No/denies         Goals Met:  Proper associated with RPD/PD & O2 Sat Exercise tolerated well  Goals Unmet:  Not Applicable  Comments:     Dr. Emily Filbert is Medical Director for [place] and [facility].

## 2016-09-09 DIAGNOSIS — Z955 Presence of coronary angioplasty implant and graft: Secondary | ICD-10-CM

## 2016-09-09 NOTE — Progress Notes (Signed)
Daily Session Note  Patient Details  Name: Phillip [name] MRN: [id number] Date of Birth: July 01, 1951 Referring Provider:   Flowsheet Row Cardiac Rehab from 08/02/2016 in [facility] Cardiac and Pulmonary Rehab  Referring Provider  End, Harrell Gave MD      Encounter Date: 09/09/2016  Check In:     Session Check In - 09/09/16 1711      Check-In   Location [facility]-Cardiac & Pulmonary Rehab   Staff Present Earlean Shawl, BS, ACSM CEP, Exercise Physiologist;Malikye Reppond Oletta Darter, BA, ACSM CEP, Exercise Physiologist;Patricia Surles RN BSN   Supervising physician immediately available to respond to emergencies See telemetry face sheet for immediately available ER MD   Medication changes reported     No   Fall or balance concerns reported    No   Warm-up and Cool-down Performed on first and last piece of equipment   Resistance Training Performed Yes   VAD Patient? No     Pain Assessment   Currently in Pain? No/denies   Multiple Pain Sites No           Exercise Prescription Changes - 09/09/16 1100      Response to Exercise   Blood Pressure (Admit) 108/74   Blood Pressure (Exercise) 136/78   Blood Pressure (Exit) 128/82   Heart Rate (Admit) 69 bpm   Heart Rate (Exercise) 130 bpm   Heart Rate (Exit) 70 bpm   Rating of Perceived Exertion (Exercise) 15   Symptoms none   Duration Progress to 45 minutes of aerobic exercise without signs/symptoms of physical distress   Intensity THRR unchanged     Progression   Progression Continue to progress workloads to maintain intensity without signs/symptoms of physical distress.     Resistance Training   Training Prescription Yes   Weight 5   Reps 10-12     Interval Training   Interval Training No     Elliptical   Level 1   Speed 3.3   Minutes 15     REL-XR   Level 6   Minutes 15      Goals Met:  Proper associated with RPD/PD & O2 Sat Independence with exercise equipment Exercise tolerated well Strength training completed today  Goals  Unmet:  Not Applicable  Comments: Pt able to follow exercise prescription today without complaint.  Will continue to monitor for progression.    Dr. Emily Filbert is Medical Director for [place] and [facility].

## 2016-09-13 ENCOUNTER — Encounter: Payer: Medicare Other | Admitting: *Deleted

## 2016-09-13 DIAGNOSIS — Z955 Presence of coronary angioplasty implant and graft: Secondary | ICD-10-CM

## 2016-09-13 NOTE — Progress Notes (Signed)
Daily Session Note  Patient Details  Name: Phillip [name] MRN: [id number] Date of Birth: [birth year] Referring Provider:   Flowsheet Row Cardiac Rehab from 08/02/2016 in [facility] Cardiac and Pulmonary Rehab  Referring Provider  End, Harrell Gave MD      Encounter Date: 09/13/2016  Check In:     Session Check In - 09/13/16 1626      Check-In   Staff Present Heath Lark, RN, BSN, Laveda Norman, BS, ACSM CEP, Exercise Physiologist;Carroll Enterkin, RN, BSN   Supervising physician immediately available to respond to emergencies See telemetry face sheet for immediately available ER MD   Medication changes reported     No   Fall or balance concerns reported    No   Warm-up and Cool-down Performed on first and last piece of equipment   Resistance Training Performed Yes   VAD Patient? No     Pain Assessment   Currently in Pain? No/denies         Goals Met:  Exercise tolerated well No report of cardiac concerns or symptoms Strength training completed today  Goals Unmet:  Not Applicable  Comments: Doing well with exercise prescription progression.    Dr. Emily Filbert is Medical Director for [place] and [facility].

## 2016-09-15 ENCOUNTER — Encounter: Payer: Self-pay | Admitting: *Deleted

## 2016-09-15 DIAGNOSIS — Z955 Presence of coronary angioplasty implant and graft: Secondary | ICD-10-CM | POA: Diagnosis not present

## 2016-09-15 NOTE — Progress Notes (Signed)
Cardiac Individual Treatment Plan  Patient Details  Name: Phillip [name] MRN: [id number] Date of Birth: [birth year] Referring Provider:   Flowsheet Row Cardiac Rehab from 08/02/2016 in [facility] Cardiac and Pulmonary Rehab  Referring Provider  End, Harrell Gave MD      Initial Encounter Date:  Flowsheet Row Cardiac Rehab from 08/02/2016 in [facility] Cardiac and Pulmonary Rehab  Date  08/02/16  Referring Provider  End, Harrell Gave MD      Visit Diagnosis: Status post coronary artery stent placement  Patient's Home Medications on Admission:  Current Outpatient Prescriptions:  .  albuterol (PROVENTIL HFA;VENTOLIN HFA) 108 (90 Base) MCG/ACT inhaler, Inhale 2 puffs into the lungs every 6 (six) hours as needed for wheezing or shortness of breath., Disp: 1 Inhaler, Rfl: 0 .  AMBULATORY NON FORMULARY MEDICATION, Take 90 mg by mouth 2 (two) times daily. Medication Name: BRILINTA 90 MG bid (TWILIGHT Research Study PROVIDED), Disp: , Rfl:  .  AMBULATORY NON FORMULARY MEDICATION, Take 81 mg by mouth daily. Medication Name: ASPIRIN 81 mg Daily (TWILIGHT Research study PROVIDED), Disp: , Rfl:  .  Ascorbic Acid (VITAMIN C PO), Take 1 tablet by mouth daily., Disp: , Rfl:  .  atorvastatin (LIPITOR) 20 MG tablet, Take 1 tablet (20 mg total) by mouth daily at 6 PM., Disp: 30 tablet, Rfl: 6 .  azithromycin (ZITHROMAX Z-PAK) 250 MG tablet, 2 tab by mouth day 1, then 1 per day, Disp: 6 tablet, Rfl: 1 .  folic acid (FOLVITE) 299 MCG tablet, Take 400 mcg by mouth daily., Disp: , Rfl:  .  Multiple Vitamins-Minerals (ZINC PO), Take 1 tablet by mouth daily., Disp: , Rfl:  .  nitroGLYCERIN (NITROSTAT) 0.4 MG SL tablet, Place 1 tablet (0.4 mg total) under the tongue every 5 (five) minutes as needed for chest pain. Max: 3 doses., Disp: 30 tablet, Rfl: 0 .  omeprazole (PRILOSEC) 40 MG capsule, Take 40 mg by mouth daily. , Disp: , Rfl:  .  saw palmetto 80 MG capsule, Take 80 mg by mouth daily. , Disp: , Rfl:  .  vitamin B-12  500 MCG tablet, Take 1 tablet (500 mcg total) by mouth daily., Disp: 30 tablet, Rfl: 0  Past Medical History: Past Medical History:  Diagnosis Date  . Acute bronchitis 10/11/2015  . Allergic rhinitis   . Arthritis   . CAD (coronary artery disease)    [name]. 06/2016 Echo: EF 55-60%, no rwma, mild AI, triv MR/TR/PR, lipomatous hypertrophy of atrial septum;  b. 06/2016 Ex MV: EF 56%, large, partially reversible anteroseptal, inferoseptal, inferior,and apical perfusion defect - borderline for ischemia (TID 1.64) - equivocal study;  c. 06/2016 PCI: LM nl, LAD 25p/47m(3.0x24 Promus Premier MR DES), LCX min irregs, RCA nl.  . Colon polyps    [name]. Severe dysplasia in [name] right-sided [place] 2006--> final pathology showed this was not [name] cancer.  Follow up colonoscopy at one year interval was normal.  . Erectile dysfunction   . GERD (gastroesophageal reflux disease)    H/o Barrett's esophagus, orginally diagnosed in 2006, biopsies showed no dysplasia; follow up EGD also showed no dysplasia  . History of BPH   . Lyme disease    x2  . Prostate infection     Tobacco Use: History  Smoking Status  . Never Smoker  Smokeless Tobacco  . Never Used    Labs: Recent Review Flowsheet Data    Labs for ITP Cardiac and Pulmonary Rehab Latest Ref Rng & Units 12/02/2008  02/05/2013 07/21/2015 07/14/2016 08/25/2016   Cholestrol 100 - 199 mg/dL 118 128 105 102 67(L)   LDLCALC 0 - 99 mg/dL 77 71 56 55 22   HDL >39 mg/dL 21.30(L) 24.00(L) 25.10(L) 18(L) 26(L)   Trlycerides 0 - 149 mg/dL 98.0 165.0(H) 121.0 147 95   Hemoglobin A1c 4.8 - 5.6 % - - - 5.6 -       Exercise Target Goals:    Exercise Program Goal: Individual exercise prescription set with THRR, safety & activity barriers. Participant demonstrates ability to understand and report RPE using BORG scale, to self-measure pulse accurately, and to acknowledge the importance of the exercise prescription.  Exercise Prescription  Goal: Starting with aerobic activity 30 plus minutes [name] day, 3 days per week for initial exercise prescription. Provide home exercise prescription and guidelines that participant acknowledges understanding prior to discharge.  Activity Barriers & Risk Stratification:     Activity Barriers & Cardiac Risk Stratification - 08/02/16 1421      Activity Barriers & Cardiac Risk Stratification   Activity Barriers Arthritis;Back Problems;Joint Problems  mild arthritis hands, history torn cartiledge knee, back surgery 1992, some disc issues. Does not lift over 50 lb   Cardiac Risk Stratification High      6 Minute Walk:     6 Minute Walk    Row Name 08/02/16 1433         6 Minute Walk   Phase Initial     Distance 1468 feet     Walk Time 6 minutes     # of Rest Breaks 0     MPH 2.78     METS 3.55     RPE 12     VO2 Peak 12.44     Symptoms No     Resting HR 87 bpm     Resting BP 126/64     Max Ex. HR 120 bpm     Max Ex. BP 126/70     2 Minute Post BP 112/60        Initial Exercise Prescription:     Initial Exercise Prescription - 08/02/16 1400      Date of Initial Exercise RX and Referring Provider   Date 08/02/16   Referring Provider End, Harrell Gave MD     Treadmill   MPH 2.5   Grade 1   Minutes 15   METs 3.26     Elliptical   Level 1   Speed 3.3   Minutes 15     REL-XR   Level 4   Watts --  speed > 50 spm   Minutes 15   METs 2     Prescription Details   Frequency (times per week) 3   Duration Progress to 45 minutes of aerobic exercise without signs/symptoms of physical distress     Intensity   THRR 40-80% of Max Heartrate 114-141   Ratings of Perceived Exertion 11-15   Perceived Dyspnea 0-4     Progression   Progression Continue to progress workloads to maintain intensity without signs/symptoms of physical distress.     Resistance Training   Training Prescription Yes   Weight 4 lbs   Reps 10-12      Perform Capillary Blood Glucose checks as  needed.  Exercise Prescription Changes:     Exercise Prescription Changes    Row Name 08/02/16 1400 08/13/16 0900 08/26/16 1200 09/09/16 1100       Exercise Review   Progression -  walk test results Yes  -  -  Response to Exercise   Blood Pressure (Admit) 126/64 140/80 114/78 108/74    Blood Pressure (Exercise) 126/70 148/68 128/80 136/78    Blood Pressure (Exit) 112/60 112/72 106/68 128/82    Heart Rate (Admit) 87 bpm 75 bpm 76 bpm 69 bpm    Heart Rate (Exercise) 120 bpm 127 bpm 125 bpm 130 bpm    Heart Rate (Exit) 64 bpm 101 bpm 88 bpm 70 bpm    Oxygen Saturation (Admit) 100 %  -  -  -    Oxygen Saturation (Exercise) 100 %  -  -  -    Rating of Perceived Exertion (Exercise) _0 Symptoms none none none none    Duration  - Progress to 45 minutes of aerobic exercise without signs/symptoms of physical distress Progress to 45 minutes of aerobic exercise without signs/symptoms of physical distress Progress to 45 minutes of aerobic exercise without signs/symptoms of physical distress    Intensity  - THRR unchanged THRR unchanged THRR unchanged      Progression   Progression  - Continue to progress workloads to maintain intensity without signs/symptoms of physical distress. Continue to progress workloads to maintain intensity without signs/symptoms of physical distress. Continue to progress workloads to maintain intensity without signs/symptoms of physical distress.    Average METs  - 4.58 4.3  -      Resistance Training   Training Prescription  - Yes Yes Yes    Weight  - 4 lbs 4 5    Reps  - 10-12 10-12 10-12      Interval Training   Interval Training  - No No No      Treadmill   MPH  - 2.5  -  -    Grade  - 1  -  -    Minutes  - 15  -  -    METs  - 3.26  -  -      Elliptical   Level  - _1 Speed  - 3.3 3.3 3.3    Minutes  - _2 REL-XR   Level  - _3 Minutes  - _4 METs  - 5.9 4.3  -       Exercise Comments:      Exercise Comments    Row Name 08/02/16 1440 08/04/16 1746 08/13/16 0954 08/26/16 1238 09/09/16 1110   Exercise Comments Phillip [name] wants to increase his stamina and energy.  He also wants to lose weight with [name] goal of 210 lbs.  First full day of exercise!  Patient was oriented to gym and equipment including functions, settings, policies, and procedures.  Patient's individual exercise prescription and treatment plan were reviewed.  All starting workloads were established based on the results of the 6 minute walk test done at initial orientation visit.  The plan for exercise progression was also introduced and progression will be customized based on patient's performance and goals. Phillip [name] is off to [name] good start with rehab.  He is doing well and already making some progress.  We will continue to track his progression. Phillip [name] is progressing well with exercise. Phillip [name] is progressing well and has increased his intensity on the XR.      Discharge Exercise Prescription (Final Exercise Prescription Changes):     Exercise Prescription Changes - 09/09/16 1100      Response to Exercise  Blood Pressure (Admit) 108/74   Blood Pressure (Exercise) 136/78   Blood Pressure (Exit) 128/82   Heart Rate (Admit) 69 bpm   Heart Rate (Exercise) 130 bpm   Heart Rate (Exit) 70 bpm   Rating of Perceived Exertion (Exercise) 15   Symptoms none   Duration Progress to 45 minutes of aerobic exercise without signs/symptoms of physical distress   Intensity THRR unchanged     Progression   Progression Continue to progress workloads to maintain intensity without signs/symptoms of physical distress.     Resistance Training   Training Prescription Yes   Weight 5   Reps 10-12     Interval Training   Interval Training No     Elliptical   Level 1   Speed 3.3   Minutes 15     REL-XR   Level 6   Minutes 15      Nutrition:  Target Goals: Understanding of nutrition guidelines, daily intake of sodium <1568m, cholesterol  <2023m calories 30% from fat and 7% or less from saturated fats, daily to have 5 or more servings of fruits and vegetables.  Biometrics:     Pre Biometrics - 08/02/16 1443      Pre Biometrics   Height 6' 1.8" (1.875 m)   Weight 231 lb (104.8 kg)   Waist Circumference 43 inches   Hip Circumference 43 inches   Waist to Hip Ratio 1 %   BMI (Calculated) 29.9   Single Leg Stand 7.85 seconds       Nutrition Therapy Plan and Nutrition Goals:     Nutrition Therapy & Goals - 09/14/16 1139      Nutrition Therapy   Diet Instructed on [name] meal plan based on 2000 calories including heart healthy dietary guidelines   Drug/Food Interactions Statins/Certain Fruits   Protein (specify units) 8   Fiber 20 grams   Whole Grain Foods 3 servings   Saturated Fats 14 max. grams   Fruits and Vegetables 5 servings/day   Sodium 2000 grams  1500 mg -ideal     Personal Nutrition Goals   Personal Goal #1 Read labels for saturated fat, trans fat and sodium.   Personal Goal #2 Increase water intake with goal of at least 32 oz. per day for goal of 6-8 cups of fluids daily.   Personal Goal #3 Increase fruit and vegetable intake with eventual goal of 5 servings daily.   Personal Goal #4 Check label on Plexus supplement.   Comments Patient had made many positive diet changes including lowering sodium intake and decreasing foods high in saturated fat.     Intervention Plan   Intervention Prescribe, educate and counsel regarding individualized specific dietary modifications aiming towards targeted core components such as weight, hypertension, lipid management, diabetes, heart failure and other comorbidities.   Expected Outcomes Short Term Goal: Understand basic principles of dietary content, such as calories, fat, sodium, cholesterol and nutrients.;Short Term Goal: [name] plan has been developed with personal nutrition goals set during dietitian appointment.;Long Term Goal: Adherence to prescribed nutrition plan.       Nutrition Discharge: Rate Your Plate Scores:     Nutrition Assessments - 08/02/16 1415      Rate Your Plate Scores   Pre Score 60   Pre Score % 66.7 %      Nutrition Goals Re-Evaluation:     Nutrition Goals Re-Evaluation    Row Name 08/23/16 1741             Personal Goal #  1 Re-Evaluation   Personal Goal #1 Cont to eat healthy.        Comments I offered him several appts with the Cardiac Rehab Registered Dietician. He said he will let us know if he decides he wants to meet with the dietiican.           Psychosocial: Target Goals: Acknowledge presence or absence of depression, maximize coping skills, provide positive support system. Participant is able to verbalize types and ability to use techniques and skills needed for reducing stress and depression.  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 Yes  wife and friends     Barriers   Psychosocial barriers to participate in program There are no identifiable barriers or psychosocial needs.;The patient should benefit from training in stress management and relaxation.     Screening Interventions   Interventions Encouraged to exercise      Quality of Life Scores:     Quality of Life - 08/02/16 1420      Quality of Life Scores   Health/Function Pre 26.3 %   Socioeconomic Pre 30 %   Psych/Spiritual Pre 27.21 %   Family Pre 28.8 %   GLOBAL Pre 27.62 %      PHQ-9: Recent Review Flowsheet Data    Depression screen [facility] 2/9 08/02/2016   Decreased Interest 0   Down, Depressed, Hopeless 0   PHQ - 2 Score 0   Altered sleeping 0   Tired, decreased energy 1   Change in appetite 0   Feeling bad or failure about yourself  0   Trouble concentrating 0   Moving slowly or  fidgety/restless 0   Suicidal thoughts 0   PHQ-9 Score 1   Difficult doing work/chores Not difficult at all      Psychosocial Evaluation and Intervention:     Psychosocial Evaluation - 08/30/16 1738      Psychosocial Evaluation & Interventions   Interventions Encouraged to exercise with the program and follow exercise prescription;Stress management education   Comments Counselor met with Phillip [name] today for initial psychosocial evaluation.  He is [name] 66 year old who had [name] stent inserted late November.  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  He states that he sleeps well and has [name] good appetite.  He denies [name] history of depression or anxiety or current symptoms; although he mentioned low energy lately - hoping the recent adjustment in medication will correct that.  He reports typically being in [name] positive mood.  His current stressors are [name] close friend and member of his congregation was "killed in [name] car accident" recently and that has been difficult.  Also, there is some family stress related to his daughter losing her job.  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  His goals for this program are to lose some weight and to increase his energy levels.  He will continue playing golf; walking and may join the gym to work out with his spouse upon completion of this program.  Staff will continue to follow with Phillip [name].      Psychosocial Re-Evaluation:  Psychosocial Re-Evaluation    Row Name 08/23/16 1746             Psychosocial Re-Evaluation   Comments When I mentioned to Phillip [name] that we have had [name] lot of ministers lately in [place] he acknowledged that it is [name] stressful job. He said he has deacons that help him but he has [name] lot of sick members. He was also in bed sick for one week last week so hopes to get to stay in [place] without any more flu episodes.           Vocational Rehabilitation: Provide  vocational rehab assistance to qualifying candidates.   Vocational Rehab Evaluation & Intervention:     Vocational Rehab - 08/02/16 1424      Initial Vocational Rehab Evaluation & Intervention   Assessment shows need for Vocational Rehabilitation No      Education: Education Goals: Education classes will be provided on [name] weekly basis, covering required topics. Participant will state understanding/return demonstration of topics presented.  Learning Barriers/Preferences:     Learning Barriers/Preferences - 08/02/16 1441      Learning Barriers/Preferences   Learning Barriers None   Learning Preferences None      Education Topics: General Nutrition Guidelines/Fats and Fiber: -Group instruction provided by verbal, written material, models and posters to present the general guidelines for heart healthy nutrition. Gives an explanation and review of dietary fats and fiber. Flowsheet Row Cardiac Rehab from 09/13/2016 in [facility] Cardiac and Pulmonary Rehab  Date  08/23/16  Educator  Beverly Gust  Instruction Review Code  2- meets goals/outcomes      Controlling Sodium/Reading Food Labels: -Group verbal and written material supporting the discussion of sodium use in heart healthy nutrition. Review and explanation with models, verbal and written materials for utilization of the food label. Flowsheet Row Cardiac Rehab from 09/13/2016 in [facility] Cardiac and Pulmonary Rehab  Date  08/30/16  Educator  PI  Instruction Review Code  2- meets goals/outcomes      Exercise Physiology & Risk Factors: - Group verbal and written instruction with models to review the exercise physiology of the cardiovascular system and associated critical values. Details cardiovascular disease risk factors and the goals associated with each risk factor. Flowsheet Row Cardiac Rehab from 09/13/2016 in [facility] Cardiac and Pulmonary Rehab  Date  09/06/16  Educator  [facility]  Instruction Review Code  2- meets goals/outcomes       Aerobic Exercise & Resistance Training: - Gives group verbal and written discussion on the health impact of inactivity. On the components of aerobic and resistive training programs and the benefits of this training and how to safely progress through these programs. Flowsheet Row Cardiac Rehab from 09/13/2016 in [facility] Cardiac and Pulmonary Rehab  Date  09/08/16  Educator  AS  Instruction Review Code  2- meets goals/outcomes      Flexibility, Balance, General Exercise Guidelines: - Provides group verbal and written instruction on the benefits of flexibility and balance training programs. Provides general exercise guidelines with specific guidelines to those with heart or lung disease. Demonstration and skill practice provided. Flowsheet Row Cardiac Rehab from 09/13/2016 in [facility] Cardiac and Pulmonary Rehab  Date  09/13/16  Educator  SB  Instruction Review Code  2- meets goals/outcomes      Stress Management: - Provides group verbal and written instruction about the health risks of elevated stress, cause of high stress, and healthy ways to reduce stress.   Depression: - Provides group  verbal and written instruction on the correlation between heart/lung disease and depressed mood, treatment options, and the stigmas associated with seeking treatment. Flowsheet Row Cardiac Rehab from 09/13/2016 in [facility] Cardiac and Pulmonary Rehab  Date  08/25/16  Educator  [facility]  Instruction Review Code  2- meets goals/outcomes      Anatomy & Physiology of the Heart: - Group verbal and written instruction and models provide basic cardiac anatomy and physiology, with the coronary electrical and arterial systems. Review of: AMI, Angina, Valve disease, Heart Failure, Cardiac Arrhythmia, Pacemakers, and the ICD.   Cardiac Procedures: - Group verbal and written instruction and models to describe the testing methods done to diagnose heart disease. Reviews the outcomes of the test results. Describes the  treatment choices: Medical Management, Angioplasty, or Coronary Bypass Surgery.   Cardiac Medications: - Group verbal and written instruction to review commonly prescribed medications for heart disease. Reviews the medication, class of the drug, and side effects. Includes the steps to properly store meds and maintain the prescription regimen. Flowsheet Row Cardiac Rehab from 09/13/2016 in [facility] Cardiac and Pulmonary Rehab  Date  08/04/16 Marisue Humble 2]  Educator  SB  Instruction Review Code  2- meets goals/outcomes      Go Sex-Intimacy & Heart Disease, Get SMART - Goal Setting: - Group verbal and written instruction through game format to discuss heart disease and the return to sexual intimacy. Provides group verbal and written material to discuss and apply goal setting through the application of the S.M.[name].R.T. Method.   Other Matters of the Heart: - Provides group verbal, written materials and models to describe Heart Failure, Angina, Valve Disease, and Diabetes in the realm of heart disease. Includes description of the disease process and treatment options available to the cardiac patient.   Exercise & Equipment Safety: - Individual verbal instruction and demonstration of equipment use and safety with use of the equipment. Flowsheet Row Cardiac Rehab from 09/13/2016 in [facility] Cardiac and Pulmonary Rehab  Date  08/02/16  Educator  Sb  Instruction Review Code  2- meets goals/outcomes      Infection Prevention: - Provides verbal and written material to individual with discussion of infection control including proper hand washing and proper equipment cleaning during exercise session. Flowsheet Row Cardiac Rehab from 09/13/2016 in [facility] Cardiac and Pulmonary Rehab  Date  08/02/16  Educator  Sb  Instruction Review Code  2- meets goals/outcomes      Falls Prevention: - Provides verbal and written material to individual with discussion of falls prevention and safety. Flowsheet Row Cardiac Rehab  from 09/13/2016 in [facility] Cardiac and Pulmonary Rehab  Date  08/02/16  Educator  Sb  Instruction Review Code  2- meets goals/outcomes      Diabetes: - Individual verbal and written instruction to review signs/symptoms of diabetes, desired ranges of glucose level fasting, after meals and with exercise. Advice that pre and post exercise glucose checks will be done for 3 sessions at entry of program.    Knowledge Questionnaire Score:     Knowledge Questionnaire Score - 08/02/16 1423      Knowledge Questionnaire Score   Pre Score 26/28  reviewed responses      Core Components/Risk Factors/Patient Goals at Admission:     Personal Goals and Risk Factors at Admission - 08/02/16 1415      Core Components/Risk Factors/Patient Goals on Admission    Weight Management Yes;Obesity;Weight Maintenance   Intervention Weight Management: Develop [name] combined nutrition and exercise program designed to reach  desired caloric intake, while maintaining appropriate intake of nutrient and fiber, sodium and fats, and appropriate energy expenditure required for the weight goal.   Admit Weight 231 lb (104.8 kg)   Goal Weight: Short Term 228 lb (103.4 kg)   Goal Weight: Long Term 210 lb (95.3 kg)   Expected Outcomes Short Term: Continue to assess and modify interventions until short term weight is achieved;Long Term: Adherence to nutrition and physical activity/exercise program aimed toward attainment of established weight goal;Weight Loss: Understanding of general recommendations for [name] balanced deficit meal plan, which promotes 1-2 lb weight loss per week and includes [name] negative energy balance of [phone] kcal/d   Sedentary Yes   Intervention Provide advice, education, support and counseling about physical activity/exercise needs.;Develop an individualized exercise prescription for aerobic and resistive training based on initial evaluation findings, risk stratification, comorbidities and participant's personal  goals.   Expected Outcomes Achievement of increased cardiorespiratory fitness and enhanced flexibility, muscular endurance and strength shown through measurements of functional capacity and personal statement of participant.   Increase Strength and Stamina Yes   Intervention Provide advice, education, support and counseling about physical activity/exercise needs.;Develop an individualized exercise prescription for aerobic and resistive training based on initial evaluation findings, risk stratification, comorbidities and participant's personal goals.   Expected Outcomes Achievement of increased cardiorespiratory fitness and enhanced flexibility, muscular endurance and strength shown through measurements of functional capacity and personal statement of participant.   Lipids Yes   Intervention Provide education and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

## 2016-09-15 NOTE — Progress Notes (Signed)
Daily Session Note  Patient Details  Name: Phillip [name] MRN: [id number] Date of Birth: [birth year] Referring Provider:   Flowsheet Row Cardiac Rehab from 08/02/2016 in [facility]  Referring Provider  End, Harrell Gave MD      Encounter Date: 09/15/2016  Check In:     Session Check In - 09/15/16 1710      Check-In   Location [facility]-Cardiac & Pulmonary Rehab   Staff Present Levell July RN BSN;Carroll Enterkin, RN, Vickki Hearing, BA, ACSM CEP, Exercise Physiologist   Supervising physician immediately available to respond to emergencies See telemetry face sheet for immediately available ER MD   Medication changes reported     No   Fall or balance concerns reported    No   Warm-up and Cool-down Performed on first and last piece of equipment   Resistance Training Performed Yes   VAD Patient? No     Pain Assessment   Currently in Pain? No/denies   Multiple Pain Sites No           Exercise Prescription Changes - 09/15/16 1700      Home Exercise Plan   Plans to continue exercise at [facility] (comment)   Frequency Add 2 additional days to program exercise sessions.      Goals Met:  Independence with exercise equipment Exercise tolerated well No report of cardiac concerns or symptoms Strength training completed today  Goals Unmet:  Not Applicable  Comments: Pt able to follow exercise prescription today without complaint.  Will continue to monitor for progression.    Dr. Emily Filbert is Medical Director for [place] and [facility].

## 2016-09-16 ENCOUNTER — Encounter: Payer: Medicare Other | Attending: Cardiovascular Disease

## 2016-09-16 DIAGNOSIS — Z955 Presence of coronary angioplasty implant and graft: Secondary | ICD-10-CM | POA: Insufficient documentation

## 2016-09-27 ENCOUNTER — Encounter: Payer: Medicare Other | Admitting: *Deleted

## 2016-09-27 DIAGNOSIS — Z955 Presence of coronary angioplasty implant and graft: Secondary | ICD-10-CM | POA: Diagnosis not present

## 2016-09-27 NOTE — Progress Notes (Signed)
Daily Session Note  Patient Details  Name: Phillip [name] MRN: [id number] Date of Birth: 1951/08/02 Referring Provider:   Flowsheet Row Cardiac Rehab from 08/02/2016 in [facility] Cardiac and Pul[facility] Rehab  Referring Provider  End, Harrell Gave MD      Encounter Date: 09/27/2016  Check In:     Session Check In - 09/27/16 1640      Check-In   Staff Present Nyoka Cowden, RN, BSN, MA;Aairah Negrette, RN, BSN, CCRP;Carroll Enterkin, RN, BSN   Supervising physician immediately available to respond to emergencies See telemetry face sheet for immediately available ER MD   Medication changes reported     No   Fall or balance concerns reported    No   Warm-up and Cool-down Performed on first and last piece of equipment   Resistance Training Performed Yes   VAD Patient? No     Pain Assessment   Currently in Pain? No/denies         Goals Met:  Exercise tolerated well No report of cardiac concerns or symptoms Strength training completed today  Goals Unmet:  Not Applicable  Comments: Doing well with exercise prescription progression.    Dr. Emily Filbert is Medical Director for [facility] and [facility].

## 2016-09-27 NOTE — Progress Notes (Signed)
Cardiac Individual Treatment Plan  Patient Details  Name: Phillip [name] MRN: [id number] Date of Birth: [birth year] Referring Provider:   Flowsheet Row Cardiac Rehab from 08/02/2016 in [facility] Cardiac and Pulmonary Rehab  Referring Provider  End, Harrell Gave MD      Initial Encounter Date:  Flowsheet Row Cardiac Rehab from 08/02/2016 in [facility] Cardiac and Pulmonary Rehab  Date  08/02/16  Referring Provider  End, Harrell Gave MD      Visit Diagnosis: Status post coronary artery stent placement  Patient's Home Medications on Admission:  Current Outpatient Prescriptions:  .  albuterol (PROVENTIL HFA;VENTOLIN HFA) 108 (90 Base) MCG/ACT inhaler, Inhale 2 puffs into the lungs every 6 (six) hours as needed for wheezing or shortness of breath., Disp: 1 Inhaler, Rfl: 0 .  AMBULATORY NON FORMULARY MEDICATION, Take 90 mg by mouth 2 (two) times daily. Medication Name: BRILINTA 90 MG bid (TWILIGHT Research Study PROVIDED), Disp: , Rfl:  .  AMBULATORY NON FORMULARY MEDICATION, Take 81 mg by mouth daily. Medication Name: ASPIRIN 81 mg Daily (TWILIGHT Research study PROVIDED), Disp: , Rfl:  .  Ascorbic Acid (VITAMIN C PO), Take 1 tablet by mouth daily., Disp: , Rfl:  .  atorvastatin (LIPITOR) 20 MG tablet, Take 1 tablet (20 mg total) by mouth daily at 6 PM., Disp: 30 tablet, Rfl: 6 .  azithromycin (ZITHROMAX Z-PAK) 250 MG tablet, 2 tab by mouth day 1, then 1 per day, Disp: 6 tablet, Rfl: 1 .  folic acid (FOLVITE) 299 MCG tablet, Take 400 mcg by mouth daily., Disp: , Rfl:  .  Multiple Vitamins-Minerals (ZINC PO), Take 1 tablet by mouth daily., Disp: , Rfl:  .  nitroGLYCERIN (NITROSTAT) 0.4 MG SL tablet, Place 1 tablet (0.4 mg total) under the tongue every 5 (five) minutes as needed for chest pain. Max: 3 doses., Disp: 30 tablet, Rfl: 0 .  omeprazole (PRILOSEC) 40 MG capsule, Take 40 mg by mouth daily. , Disp: , Rfl:  .  saw palmetto 80 MG capsule, Take 80 mg by mouth daily. , Disp: , Rfl:  .  vitamin B-12  500 MCG tablet, Take 1 tablet (500 mcg total) by mouth daily., Disp: 30 tablet, Rfl: 0  Past Medical History: Past Medical History:  Diagnosis Date  . Acute bronchitis 10/11/2015  . Allergic rhinitis   . Arthritis   . CAD (coronary artery disease)    [name]. 06/2016 Echo: EF 55-60%, no rwma, mild AI, triv MR/TR/PR, lipomatous hypertrophy of atrial septum;  b. 06/2016 Ex MV: EF 56%, large, partially reversible anteroseptal, inferoseptal, inferior,and apical perfusion defect - borderline for ischemia (TID 1.64) - equivocal study;  c. 06/2016 PCI: LM nl, LAD 25p/47m(3.0x24 Promus Premier MR DES), LCX min irregs, RCA nl.  . Colon polyps    [name]. Severe dysplasia in [name] right-sided polyp [place] 2006--> final pathology showed this was not [name] cancer.  Follow up colonoscopy at one year interval was normal.  . Erectile dysfunction   . GERD (gastroesophageal reflux disease)    H/o Barrett's esophagus, orginally diagnosed in 2006, biopsies showed no dysplasia; follow up EGD also showed no dysplasia  . History of BPH   . Lyme disease    x2  . Prostate infection     Tobacco Use: History  Smoking Status  . Never Smoker  Smokeless Tobacco  . Never Used    Labs: Recent Review Flowsheet Data    Labs for ITP Cardiac and Pulmonary Rehab Latest Ref Rng & Units 12/02/2008  02/05/2013 07/21/2015 07/14/2016 08/25/2016   Cholestrol 100 - 199 mg/dL 118 128 105 102 67(L)   LDLCALC 0 - 99 mg/dL 77 71 56 55 22   HDL >39 mg/dL 21.30(L) 24.00(L) 25.10(L) 18(L) 26(L)   Trlycerides 0 - 149 mg/dL 98.0 165.0(H) 121.0 147 95   Hemoglobin A1c 4.8 - 5.6 % - - - 5.6 -       Exercise Target Goals:    Exercise Program Goal: Individual exercise prescription set with THRR, safety & activity barriers. Participant demonstrates ability to understand and report RPE using BORG scale, to self-measure pulse accurately, and to acknowledge the importance of the exercise prescription.  Exercise Prescription  Goal: Starting with aerobic activity 30 plus minutes [name] day, 3 days per week for initial exercise prescription. Provide home exercise prescription and guidelines that participant acknowledges understanding prior to discharge.  Activity Barriers & Risk Stratification:     Activity Barriers & Cardiac Risk Stratification - 08/02/16 1421      Activity Barriers & Cardiac Risk Stratification   Activity Barriers Arthritis;Back Problems;Joint Problems  mild arthritis hands, history torn cartiledge knee, back surgery 1992, some disc issues. Does not lift over 50 lb   Cardiac Risk Stratification High      6 Minute Walk:     6 Minute Walk    Row Name 08/02/16 1433         6 Minute Walk   Phase Initial     Distance 1468 feet     Walk Time 6 minutes     # of Rest Breaks 0     MPH 2.78     METS 3.55     RPE 12     VO2 Peak 12.44     Symptoms No     Resting HR 87 bpm     Resting BP 126/64     Max Ex. HR 120 bpm     Max Ex. BP 126/70     2 Minute Post BP 112/60        Initial Exercise Prescription:     Initial Exercise Prescription - 08/02/16 1400      Date of Initial Exercise RX and Referring Provider   Date 08/02/16   Referring Provider End, Harrell Gave MD     Treadmill   MPH 2.5   Grade 1   Minutes 15   METs 3.26     Elliptical   Level 1   Speed 3.3   Minutes 15     REL-XR   Level 4   Watts --  speed > 50 spm   Minutes 15   METs 2     Prescription Details   Frequency (times per week) 3   Duration Progress to 45 minutes of aerobic exercise without signs/symptoms of physical distress     Intensity   THRR 40-80% of Max Heartrate 114-141   Ratings of Perceived Exertion 11-15   Perceived Dyspnea 0-4     Progression   Progression Continue to progress workloads to maintain intensity without signs/symptoms of physical distress.     Resistance Training   Training Prescription Yes   Weight 4 lbs   Reps 10-12      Perform Capillary Blood Glucose checks as  needed.  Exercise Prescription Changes:     Exercise Prescription Changes    Row Name 08/02/16 1400 08/13/16 0900 08/26/16 1200 09/09/16 1100 09/15/16 1700     Exercise Review   Progression -  walk test results Yes  -  -  -  Response to Exercise   Blood Pressure (Admit) 126/64 140/80 114/78 108/74  -   Blood Pressure (Exercise) 126/70 148/68 128/80 136/78  -   Blood Pressure (Exit) 112/60 112/72 106/68 128/82  -   Heart Rate (Admit) 87 bpm 75 bpm 76 bpm 69 bpm  -   Heart Rate (Exercise) 120 bpm 127 bpm 125 bpm 130 bpm  -   Heart Rate (Exit) 64 bpm 101 bpm 88 bpm 70 bpm  -   Oxygen Saturation (Admit) 100 %  -  -  -  -   Oxygen Saturation (Exercise) 100 %  -  -  -  -   Rating of Perceived Exertion (Exercise) '12 14 13 15  '$ -   Symptoms none none none none  -   Duration  - Progress to 45 minutes of aerobic exercise without signs/symptoms of physical distress Progress to 45 minutes of aerobic exercise without signs/symptoms of physical distress Progress to 45 minutes of aerobic exercise without signs/symptoms of physical distress  -   Intensity  - THRR unchanged THRR unchanged THRR unchanged  -     Progression   Progression  - Continue to progress workloads to maintain intensity without signs/symptoms of physical distress. Continue to progress workloads to maintain intensity without signs/symptoms of physical distress. Continue to progress workloads to maintain intensity without signs/symptoms of physical distress.  -   Average METs  - 4.58 4.3  -  -     Resistance Training   Training Prescription  - Yes Yes Yes  -   Weight  - 4 lbs 4 5  -   Reps  - 10-12 10-12 10-12  -     Interval Training   Interval Training  - No No No  -     Treadmill   MPH  - 2.5  -  -  -   Grade  - 1  -  -  -   Minutes  - 15  -  -  -   METs  - 3.26  -  -  -     Elliptical   Level  - '1 1 1  '$ -   Speed  - 3.3 3.3 3.3  -   Minutes  - '15 15 15  '$ -     REL-XR   Level  - '4 4 6  '$ -   Minutes  - '15 15 15   '$ -   METs  - 5.9 4.3  -  -     Home Exercise Plan   Plans to continue exercise at  -  -  -  - [employer] (comment)   Frequency  -  -  -  - Add 2 additional days to program exercise sessions.      Exercise Comments:     Exercise Comments    Row Name 08/02/16 1440 08/04/16 1746 08/13/16 0954 08/26/16 1238 09/09/16 1110   Exercise Comments Phillip [name] wants to increase his stamina and energy.  He also wants to lose weight with [name] goal of 210 lbs.  First full day of exercise!  Patient was oriented to gym and equipment including functions, settings, policies, and procedures.  Patient's individual exercise prescription and treatment plan were reviewed.  All starting workloads were established based on the results of the 6 minute walk test done at initial orientation visit.  The plan for exercise progression was also introduced and progression will be customized based on patient's performance and goals. Phillip [name] is off  to [name] good start with rehab.  He is doing well and already making some progress.  We will continue to track his progression. Phillip [name] is progressing well with exercise. Phillip [name] is progressing well and has increased his intensity on the XR.   [place] Name 09/15/16 1710 09/23/16 1111         Exercise Comments Phillip [name] may join  [employer] or another facility to exercise after [employer]. Phillip [name] has been out of town since last update.         Discharge Exercise Prescription (Final Exercise Prescription Changes):     Exercise Prescription Changes - 09/15/16 1700      Home Exercise Plan   Plans to continue exercise at [facility] (comment)   Frequency Add 2 additional days to program exercise sessions.      Nutrition:  Target Goals: Understanding of nutrition guidelines, daily intake of sodium '1500mg'$ , cholesterol '200mg'$ , calories 30% from fat and 7% or less from saturated fats, daily to have 5 or more servings of fruits and vegetables.  Biometrics:     Pre Biometrics - 08/02/16  1443      Pre Biometrics   Height 6' 1.8" (1.875 m)   Weight 231 lb (104.8 kg)   Waist Circumference 43 inches   Hip Circumference 43 inches   Waist to Hip Ratio 1 %   BMI (Calculated) 29.9   Single Leg Stand 7.85 seconds       Nutrition Therapy Plan and Nutrition Goals:     Nutrition Therapy & Goals - 09/14/16 1139      Nutrition Therapy   Diet Instructed on [name] meal plan based on 2000 calories including heart healthy dietary guidelines   Drug/Food Interactions Statins/Certain Fruits   Protein (specify units) 8   Fiber 20 grams   Whole Grain Foods 3 servings   Saturated Fats 14 max. grams   Fruits and Vegetables 5 servings/day   Sodium 2000 grams  1500 mg -ideal     Personal Nutrition Goals   Personal Goal #1 Read labels for saturated fat, trans fat and sodium.   Personal Goal #2 Increase water intake with goal of at least 32 oz. per day for goal of 6-8 cups of fluids daily.   Personal Goal #3 Increase fruit and vegetable intake with eventual goal of 5 servings daily.   Personal Goal #4 Check label on Plexus supplement.   Comments Patient had made many positive diet changes including lowering sodium intake and decreasing foods high in saturated fat.     Intervention Plan   Intervention Prescribe, educate and counsel regarding individualized specific dietary modifications aiming towards targeted core components such as weight, hypertension, lipid management, diabetes, heart failure and other comorbidities.   Expected Outcomes Short Term Goal: Understand basic principles of dietary content, such as calories, fat, sodium, cholesterol and nutrients.;Short Term Goal: [name] plan has been developed with personal nutrition goals set during dietitian appointment.;Long Term Goal: Adherence to prescribed nutrition plan.      Nutrition Discharge: Rate Your Plate Scores:     Nutrition Assessments - 08/02/16 1415      Rate Your Plate Scores   Pre Score 60   Pre Score % 66.7 %       Nutrition Goals Re-Evaluation:     Nutrition Goals Re-Evaluation    Row Name 08/23/16 1741 09/27/16 1745           Personal Goal #1 Re-Evaluation   Personal Goal #1 Cont to eat healthy.   -  Comments I offered him several appts with the Cardiac Rehab Registered Dietician. He said he will let us know if he decides he wants to meet with the dietiican.  Phillip [name] said he has been reading labels.         Personal Goal #2 Re-Evaluation   Personal Goal #2  - has increased his water intake.        Personal Goal #3 Re-Evaluation   Personal Goal #3  - Phillip [name] said he has tried to increase his fruite and vegetable intake.         Personal Goal #4 Re-Evaluation   Personal Goal #4  - Phillip [name] said the Dietician helped him check the label on his Plexus supplement.          Psychosocial: Target Goals: Acknowledge presence or absence of depression, maximize coping skills, provide positive support system. Participant is able to verbalize types and ability to use techniques and skills needed for reducing stress and depression.  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 Yes  wife and friends     Barriers   Psychosocial barriers to participate in program There are no identifiable barriers or psychosocial needs.;The patient should benefit from training in stress management and relaxation.     Screening Interventions   Interventions Encouraged to exercise      Quality of Life Scores:     Quality of Life - 08/02/16 1420      Quality of Life Scores   Health/Function Pre 26.3 %   Socioeconomic Pre 30 %   Psych/Spiritual Pre 27.21 %   Family Pre 28.8 %   GLOBAL Pre 27.62 %      PHQ-9: Recent Review Flowsheet Data    Depression screen [facility] 2/9  08/02/2016   Decreased Interest 0   Down, Depressed, Hopeless 0   PHQ - 2 Score 0   Altered sleeping 0   Tired, decreased energy 1   Change in appetite 0   Feeling bad or failure about yourself  0   Trouble concentrating 0   Moving slowly or fidgety/restless 0   Suicidal thoughts 0   PHQ-9 Score 1   Difficult doing work/chores Not difficult at all      Psychosocial Evaluation and Intervention:     Psychosocial Evaluation - 08/30/16 1738      Psychosocial Evaluation & Interventions   Interventions Encouraged to exercise with the program and follow exercise prescription;Stress management education   Comments Counselor met with Phillip [name] today for initial psychosocial evaluation.  He is [name] 66 year old who had [name] stent inserted late November.  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  He states that he sleeps well and has [name] good appetite.  He denies [name] history of depression or anxiety or current symptoms; although he mentioned low energy lately - hoping the recent adjustment in medication will correct that.  He reports typically being in [name] positive mood.  His current stressors are [name] close friend and member of his congregation was "killed in [name] car accident" recently and that has been difficult.  Also, there is some family  stress related to his daughter losing her job.  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  His goals for this program are to lose some weight and to increase his energy levels.  He will continue playing golf; walking and may join the gym to work out with his spouse upon completion of this program.  Staff will continue to follow with Phillip [name].      Psychosocial Re-Evaluation:     Psychosocial Re-Evaluation    [place] Name 08/23/16 1746 09/15/16 1708 09/27/16 1743         Psychosocial Re-Evaluation   Comments When I mentioned to Phillip [name] that we have had [name] lot of ministers lately in Cardiac Rehab he acknowledged  that it is [name] stressful job. He said he has deacons that help him but he has [name] lot of sick members. He was also in bed sick for one week last week so hopes to get to stay in [place] without any more flu episodes.  Counselor follow up with Phillip [name] reporting feeling much more energy since coming into this program. This could be [name] result of his Dr. Gerrianne Scale one of his Rx (Lipitor), but also his stress levels are down as [name] result of working out more consistently.  Counselor followed on his recent losses of loved ones and Phillip [name] states he and his congregation are doing better with time.  The financial stress he was facing has resolved itself and he is relieved by that as well.  Counselor commended Phillip [name] on all his hard work and consistency in exercise and healthier coping strategies.   Phillip [name] said he has [name] stressful job since he is the only paid staff member but he does have some volunteer help. Phillip [name] recently returned from having some time off of work and got refreshed.         Vocational Rehabilitation: Provide vocational rehab assistance to qualifying candidates.   Vocational Rehab Evaluation & Intervention:     Vocational Rehab - 08/02/16 1424      Initial Vocational Rehab Evaluation & Intervention   Assessment shows need for Vocational Rehabilitation No      Education: Education Goals: Education classes will be provided on [name] weekly basis, covering required topics. Participant will state understanding/return demonstration of topics presented.  Learning Barriers/Preferences:     Learning Barriers/Preferences - 08/02/16 1441      Learning Barriers/Preferences   Learning Barriers None   Learning Preferences None      Education Topics: General Nutrition Guidelines/Fats and Fiber: -Group instruction provided by verbal, written material, models and posters to present the general guidelines for heart healthy nutrition. Gives an explanation and review of dietary fats and fiber. Flowsheet  Row Cardiac Rehab from 09/27/2016 in [facility] Cardiac and Pulmonary Rehab  Date  08/23/16  Educator  Beverly Gust  Instruction Review Code  2- meets goals/outcomes      Controlling Sodium/Reading Food Labels: -Group verbal and written material supporting the discussion of sodium use in heart healthy nutrition. Review and explanation with models, verbal and written materials for utilization of the food label. Flowsheet Row Cardiac Rehab from 09/27/2016 in [facility] Cardiac and Pulmonary Rehab  Date  08/30/16  Educator  PI  Instruction Review Code  2- meets goals/outcomes      Exercise Physiology & Risk Factors: - Group verbal and written instruction with models to review the exercise physiology of the cardiovascular system and associated critical values. Details cardiovascular disease risk factors and the goals associated  with each risk factor. Flowsheet Row Cardiac Rehab from 09/27/2016 in [facility] Cardiac and Pulmonary Rehab  Date  09/06/16  Educator  [facility]  Instruction Review Code  2- meets goals/outcomes      Aerobic Exercise & Resistance Training: - Gives group verbal and written discussion on the health impact of inactivity. On the components of aerobic and resistive training programs and the benefits of this training and how to safely progress through these programs. Flowsheet Row Cardiac Rehab from 09/27/2016 in [facility] Cardiac and Pulmonary Rehab  Date  09/08/16  Educator  AS  Instruction Review Code  2- meets goals/outcomes      Flexibility, Balance, General Exercise Guidelines: - Provides group verbal and written instruction on the benefits of flexibility and balance training programs. Provides general exercise guidelines with specific guidelines to those with heart or lung disease. Demonstration and skill practice provided. Flowsheet Row Cardiac Rehab from 09/27/2016 in [facility] Cardiac and Pulmonary Rehab  Date  09/13/16  Educator  SB  Instruction Review Code  2- meets goals/outcomes       Stress Management: - Provides group verbal and written instruction about the health risks of elevated stress, cause of high stress, and healthy ways to reduce stress.   Depression: - Provides group verbal and written instruction on the correlation between heart/lung disease and depressed mood, treatment options, and the stigmas associated with seeking treatment. Flowsheet Row Cardiac Rehab from 09/27/2016 in [facility] Cardiac and Pulmonary Rehab  Date  08/25/16  Educator  [facility]  Instruction Review Code  2- meets goals/outcomes      Anatomy & Physiology of the Heart: - Group verbal and written instruction and models provide basic cardiac anatomy and physiology, with the coronary electrical and arterial systems. Review of: AMI, Angina, Valve disease, Heart Failure, Cardiac Arrhythmia, Pacemakers, and the ICD.   Cardiac Procedures: - Group verbal and written instruction and models to describe the testing methods done to diagnose heart disease. Reviews the outcomes of the test results. Describes the treatment choices: Medical Management, Angioplasty, or Coronary Bypass Surgery. Flowsheet Row Cardiac Rehab from 09/27/2016 in [facility] Cardiac and Pulmonary Rehab  Date  09/27/16  Educator  CE  Instruction Review Code  2- meets goals/outcomes      Cardiac Medications: - Group verbal and written instruction to review commonly prescribed medications for heart disease. Reviews the medication, class of the drug, and side effects. Includes the steps to properly store meds and maintain the prescription regimen. Flowsheet Row Cardiac Rehab from 09/27/2016 in [facility] Cardiac and Pulmonary Rehab  Date  08/04/16 Marisue Humble 2]  Educator  SB  Instruction Review Code  2- meets goals/outcomes      Go Sex-Intimacy & Heart Disease, Get SMART - Goal Setting: - Group verbal and written instruction through game format to discuss heart disease and the return to sexual intimacy. Provides group verbal and written material to  discuss and apply goal setting through the application of the S.M.[name].R.T. Method. Flowsheet Row Cardiac Rehab from 09/27/2016 in [facility] Cardiac and Pulmonary Rehab  Date  09/27/16  Educator  CE  Instruction Review Code  2- meets goals/outcomes      Other Matters of the Heart: - Provides group verbal, written materials and models to describe Heart Failure, Angina, Valve Disease, and Diabetes in the realm of heart disease. Includes description of the disease process and treatment options available to the cardiac patient.   Exercise & Equipment Safety: - Individual verbal instruction and demonstration of equipment use and safety  with use of the equipment. Flowsheet Row Cardiac Rehab from 09/27/2016 in [facility] Cardiac and Pulmonary Rehab  Date  08/02/16  Educator  Sb  Instruction Review Code  2- meets goals/outcomes      Infection Prevention: - Provides verbal and written material to individual with discussion of infection control including proper hand washing and proper equipment cleaning during exercise session. Flowsheet Row Cardiac Rehab from 09/27/2016 in [facility] Cardiac and Pulmonary Rehab  Date  08/02/16  Educator  Sb  Instruction Review Code  2- meets goals/outcomes      Falls Prevention: - Provides verbal and written material to individual with discussion of falls prevention and safety. Flowsheet Row Cardiac Rehab from 09/27/2016 in [facility] Cardiac and Pulmonary Rehab  Date  08/02/16  Educator  Sb  Instruction Review Code  2- meets goals/outcomes      Diabetes: - Individual verbal and written instruction to review signs/symptoms of diabetes, desired ranges of glucose level fasting, after meals and with exercise. Advice that pre and post exercise glucose checks will be done for 3 sessions at entry of program.    Knowledge Questionnaire Score:     Knowledge Questionnaire Score - 08/02/16 1423      Knowledge Questionnaire Score   Pre Score 26/28  reviewed responses      Core  Components/Risk Factors/Patient Goals at Admission:     Personal Goals and Risk Factors at Admission - 08/02/16 1415      Core Components/Risk Factors/Patient Goals on Admission    Weight Management Yes;Obesity;Weight Maintenance   Intervention Weight Management: Develop [name] combined nutrition and exercise program designed to reach desired caloric intake, while maintaining appropriate intake of nutrient and fiber, sodium and fats, and appropriate energy expenditure required for the weight goal.   Admit Weight 231 lb (104.8 kg)   Goal Weight: Short Term 228 lb (103.4 kg)   Goal Weight: Long Term 210 lb (95.3 kg)   Expected Outcomes Short Term: Continue to assess and modify interventions until short term weight is achieved;Long Term: Adherence to nutrition and physical activity/exercise program aimed toward attainment of established weight goal;Weight Loss: Understanding of general recommendations for [name] balanced deficit meal plan, which promotes 1-2 lb weight loss per week and includes [name] negative energy balance of [phone] kcal/d   Sedentary Yes   Intervention Provide advice, education, support and counseling about physical activity/exercise needs.;Develop an individualized exercise prescription for aerobic and resistive training based on initial evaluation findings, risk stratification, comorbidities and participant's personal goals.   Expected Outcomes Achievement of increased cardiorespiratory fitness and enhanced flexibility, muscular endurance and strength shown through measurements of functional capacity and personal statement of participant.   Increase Strength and Stamina Yes   Intervention Provide advice, education, support and counseling about physical activity/exercise needs.;Develop an individualized exercise prescription for aerobic and resistive training based on initial evaluation findings, risk stratification, comorbidities and participant's personal goals.   Expected Outcomes  Achievement of increased cardiorespiratory fitness and enhanced flexibility, muscular endurance and strength shown through measurements of functional capacity and personal statement of participant.   Lipids Yes   Intervention Provide education and support for participant on nutrition & aerobic/resistive exercise along with prescribed medications to achieve LDL '70mg'$ , HDL >'40mg'$ .   Expected Outcomes Short Term: Participant states understanding of desired cholesterol values and is compliant with medications prescribed. Participant is following exercise prescription and nutrition guidelines.;Long Term: Cholesterol controlled with medications as prescribed, with individualized exercise RX and with personalized nutrition plan. Value goals: LDL <  $'70mg'C$ , HDL > 40 mg.   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 Teach and support self-help strategies.;Refer participants experiencing significant psychosocial distress to appropriate mental health specialists for further evaluation and treatment. When possible, include family members and significant others in education/counseling sessions.   Expected Outcomes Short Term: Participant demonstrates changes in health-related behavior, relaxation and other stress management skills, ability to obtain effective social support, and compliance with psychotropic medications if prescribed.;Long Term: Emotional wellbeing is indicated by absence of clinically significant psychosocial distress or social isolation.      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 He said he has deacons that help him but it is [name] stressful job since he has [name] lot of sick members. Phillip [name] hopes to get his stamina back.  WEight even after having some relaxation time off is 221lbs. He said he plans on exercising with his wife at [facility]. He said he golfs and walks when he golfs.       Expected Outcomes Heart healthier life. To keep living [name] heart healthy life.          Core Components/Risk Factors/Patient Goals at Discharge (Final Review):      Goals and Risk Factor Review - 09/27/16 1744      Core Components/Risk Factors/Patient Goals Review   Review WEight even after having some relaxation time off is 221lbs. He said he plans on exercising with his wife at [facility]. He said he golfs and walks when he golfs.    Expected Outcomes To keep living [name] heart healthy life.       ITP Comments:     ITP Comments    Row Name 08/02/16 1410 08/04/16 1746 08/17/16 0632 08/23/16 1740 09/15/16 0631   ITP Comments Medical review completed, Initial ITP created. Diagnosis documentation can be found [facility] Encounter 07/12/2016  First full day of exercise!  Patient was oriented to gym and equipment including functions, settings, policies, and procedures.  Patient's individual exercise prescription and treatment plan were reviewed.  All starting workloads were established based on the results of the 6 minute walk test done at initial orientation visit.  The plan for exercise progression was also introduced and progression will be customized based on patient's performance and goals. 30 day review. Continue with ITP unless directed changes per Medical Director review. Phillip [name] said he was out sick for one week with the flu and was actually in bed for one week even while his family was visiting from [place] and [place]. He hopes to get his stamina better since he is able to attend Cardiac REhab now.  30 day  review. Continue with ITP unless directed changes per Medical Director review.      Comments:

## 2016-09-29 ENCOUNTER — Encounter: Payer: Medicare Other | Admitting: *Deleted

## 2016-09-29 DIAGNOSIS — Z955 Presence of coronary angioplasty implant and graft: Secondary | ICD-10-CM | POA: Diagnosis not present

## 2016-09-29 NOTE — Progress Notes (Signed)
Daily Session Note  Patient Details  Name: Phillip [name] MRN: [id number] Date of Birth: 12/12/1950 Referring Provider:   Flowsheet Row Cardiac Rehab from 08/02/2016 in [facility] Cardiac and Pulmonary Rehab  Referring Provider  End, Christopher MD      Encounter Date: 09/29/2016  Check In:     Session Check In - 09/29/16 1636      Check-In   Location [facility]Cardiac & Pulmonary Rehab   Staff Present Carroll Enterkin, RN, BSN;Amanda Sommer, BA, ACSM CEP, Exercise Physiologist;Patricia Surles RN BSN   Supervising physician immediately available to respond to emergencies See telemetry face sheet for immediately available ER MD   Medication changes reported     No   Fall or balance concerns reported    No   Warm-up and Cool-down Performed on first and last piece of equipment   Resistance Training Performed Yes   VAD Patient? No     Pain Assessment   Currently in Pain? No/denies         Goals Met:  Proper associated with RPD/PD & O2 Sat Exercise tolerated well  Goals Unmet:  Not Applicable  Comments:     Dr. Mark Miller is Medical Director for [facility] and [facility]. 

## 2016-09-30 DIAGNOSIS — Z955 Presence of coronary angioplasty implant and graft: Secondary | ICD-10-CM

## 2016-09-30 NOTE — Progress Notes (Signed)
Daily Session Note  Patient Details  Name: Phillip [name] MRN: [id number] Date of Birth: 1951/07/03 Referring Provider:   Flowsheet Row Cardiac Rehab from 08/02/2016 in [facility] Cardiac and Pulmonary Rehab  Referring Provider  End, Harrell Gave MD      Encounter Date: 09/30/2016  Check In:     Session Check In - 09/30/16 1631      Check-In   Location [facility]   Staff Present Levell July RN Moises Blood, BS, ACSM CEP, Exercise Physiologist;Amanda Oletta Darter, [place], ACSM CEP, Exercise Physiologist   Supervising physician immediately available to respond to emergencies See telemetry face sheet for immediately available ER MD   Medication changes reported     No   Fall or balance concerns reported    No   Warm-up and Cool-down Performed on first and last piece of equipment   Resistance Training Performed Yes   VAD Patient? No     Pain Assessment   Currently in Pain? No/denies         Goals Met:  Independence with exercise equipment Exercise tolerated well No report of cardiac concerns or symptoms Strength training completed today  Goals Unmet:  Not Applicable  Comments: Pt able to follow exercise prescription today without complaint.  Will continue to monitor for progression.    Dr. Emily Filbert is Medical Director for [place] and [facility].

## 2016-10-04 ENCOUNTER — Encounter: Payer: Medicare Other | Admitting: *Deleted

## 2016-10-04 DIAGNOSIS — Z955 Presence of coronary angioplasty implant and graft: Secondary | ICD-10-CM

## 2016-10-04 NOTE — Progress Notes (Signed)
Daily Session Note  Patient Details  Name: Riggs Dineen MRN: [id number] Date of Birth: 09/28/1950 Referring Provider:   Flowsheet Row Cardiac Rehab from 08/02/2016 in [facility] Cardiac and Pulmonary Rehab  Referring Provider  End, Harrell Gave MD      Encounter Date: 10/04/2016  Check In:     Session Check In - 10/04/16 1626      Check-In   Location [facility]   Staff Present Gerlene Burdock, RN, Moises Blood, BS, ACSM CEP, Exercise Physiologist;Deja Pisarski, RN, BSN, CCRP   Supervising physician immediately available to respond to emergencies See telemetry face sheet for immediately available ER MD   Medication changes reported     No   Fall or balance concerns reported    No   Warm-up and Cool-down Performed on first and last piece of equipment   Resistance Training Performed Yes   VAD Patient? No     Pain Assessment   Currently in Pain? No/denies   Multiple Pain Sites No         Goals Met:  Independence with exercise equipment Exercise tolerated well No report of cardiac concerns or symptoms Strength training completed today  Goals Unmet:  Not Applicable  Comments: Doing well with exercise prescription progression.    Dr. Emily Filbert is Medical Director for [place] and [facility].

## 2016-10-06 DIAGNOSIS — Z955 Presence of coronary angioplasty implant and graft: Secondary | ICD-10-CM

## 2016-10-06 NOTE — Progress Notes (Signed)
Daily Session Note  Patient Details  Name: Phillip [name] MRN: [id number] Date of Birth: [birth year] Referring Provider:   Flowsheet Row Cardiac Rehab from 08/02/2016 in [facility] Cardiac and Pulmonary Rehab  Referring Provider  End, Harrell Gave MD      Encounter Date: 10/06/2016  Check In:     Session Check In - 10/06/16 1709      Check-In   Location [facility]   Staff Present Gerlene Burdock, RN, Vickki Hearing, BA, ACSM CEP, Exercise Physiologist;Drena Ham Brayton El, DPT, CEEA   Supervising physician immediately available to respond to emergencies See telemetry face sheet for immediately available ER MD   Medication changes reported     No   Fall or balance concerns reported    No   Warm-up and Cool-down Performed on first and last piece of equipment   Resistance Training Performed Yes   VAD Patient? No     Pain Assessment   Currently in Pain? No/denies   Multiple Pain Sites No         Goals Met:  Exercise tolerated well  Goals Unmet:  Not Applicable  Comments: Patient completed exercise prescription and all exercise goals during rehab session. The exercise was tolerated well and the patient is progressing in the program.    Dr. Emily Filbert is Medical Director for [place] and [facility].

## 2016-10-07 DIAGNOSIS — Z955 Presence of coronary angioplasty implant and graft: Secondary | ICD-10-CM | POA: Diagnosis not present

## 2016-10-07 NOTE — Progress Notes (Signed)
Daily Session Note  Patient Details  Name: Phillip [name] MRN: [id number] Date of Birth: [birth year] Referring Provider:   Flowsheet Row Cardiac Rehab from 08/02/2016 in [facility] Cardiac and Pulmonary Rehab  Referring Provider  End, Harrell Gave MD      Encounter Date: 10/07/2016  Check In:     Session Check In - 10/07/16 1656      Check-In   Location [facility]   Staff Present Earlean Shawl, BS, ACSM CEP, Exercise Physiologist;Carroll Enterkin, RN, Vickki Hearing, BA, ACSM CEP, Exercise Physiologist   Supervising physician immediately available to respond to emergencies See telemetry face sheet for immediately available ER MD   Medication changes reported     No   Fall or balance concerns reported    No   Warm-up and Cool-down Performed on first and last piece of equipment   Resistance Training Performed Yes   VAD Patient? No     Pain Assessment   Currently in Pain? No/denies   Multiple Pain Sites No           Exercise Prescription Changes - 10/07/16 1100      Response to Exercise   Blood Pressure (Admit) 114/62   Heart Rate (Admit) 78 bpm   Heart Rate (Exercise) 121 bpm   Heart Rate (Exit) 77 bpm   Rating of Perceived Exertion (Exercise) 13   Symptoms none   Duration Progress to 45 minutes of aerobic exercise without signs/symptoms of physical distress   Intensity THRR unchanged     Progression   Progression Continue to progress workloads to maintain intensity without signs/symptoms of physical distress.   Average METs 6.3     Resistance Training   Training Prescription Yes   Weight 5   Reps 10-12     Elliptical   Level 1   Minutes 15     REL-XR   Level 6   Minutes 15   METs 6.3      Goals Met:  Independence with exercise equipment Exercise tolerated well No report of cardiac concerns or symptoms Strength training completed today  Goals Unmet:  Not Applicable  Comments: Pt able to follow exercise prescription today without  complaint.  Will continue to monitor for progression.    Dr. Emily Filbert is Medical Director for [place] and [facility].

## 2016-10-11 ENCOUNTER — Other Ambulatory Visit
Admission: RE | Admit: 2016-10-11 | Discharge: 2016-10-11 | Disposition: A | Discharge status: Home / Self Care | Payer: Medicare Other | Source: Ambulatory Visit | Attending: Nurse Practitioner | Admitting: Nurse Practitioner

## 2016-10-11 ENCOUNTER — Telehealth: Payer: Self-pay | Admitting: Internal Medicine

## 2016-10-11 ENCOUNTER — Encounter: Payer: Medicare Other | Admitting: *Deleted

## 2016-10-11 ENCOUNTER — Telehealth: Payer: Self-pay | Admitting: *Deleted

## 2016-10-11 ENCOUNTER — Other Ambulatory Visit: Payer: Self-pay | Admitting: *Deleted

## 2016-10-11 ENCOUNTER — Other Ambulatory Visit: Payer: Self-pay | Admitting: Family Medicine

## 2016-10-11 DIAGNOSIS — Z79899 Other long term (current) drug therapy: Secondary | ICD-10-CM | POA: Insufficient documentation

## 2016-10-11 DIAGNOSIS — E785 Hyperlipidemia, unspecified: Secondary | ICD-10-CM

## 2016-10-11 DIAGNOSIS — Z955 Presence of coronary angioplasty implant and graft: Secondary | ICD-10-CM

## 2016-10-11 DIAGNOSIS — Z125 Encounter for screening for malignant neoplasm of prostate: Secondary | ICD-10-CM

## 2016-10-11 LAB — LIPID PANEL
Cholesterol: 68 mg/dL (ref 0–200)
HDL: 24 mg/dL — AB (ref >40)
LDL CALC: 27 mg/dL (ref 0–99)
Total CHOL/HDL Ratio: 2.8 RATIO
Triglycerides: 86 mg/dL (ref <150)
VLDL: 17 mg/dL (ref 0–40)

## 2016-10-11 LAB — HEPATIC FUNCTION PANEL
ALK PHOS: 101 U/L (ref 38–126)
ALT: 19 U/L (ref 17–63)
AST: 25 U/L (ref 15–41)
Albumin: 4 g/dL (ref 3.5–5.0)
BILIRUBIN DIRECT: 0.1 mg/dL (ref 0.1–0.5)
BILIRUBIN TOTAL: 0.8 mg/dL (ref 0.3–1.2)
Indirect Bilirubin: 0.7 mg/dL (ref 0.3–0.9)
Total Protein: 7.2 g/dL (ref 6.5–8.1)

## 2016-10-11 MED ORDER — ATORVASTATIN CALCIUM 20 MG PO TABS: 20.0000 mg | ORAL_TABLET | Freq: Every day | ORAL | 2 refills | Status: DC

## 2016-10-11 MED ORDER — ATORVASTATIN CALCIUM 10 MG PO TABS: 10.0000 mg | ORAL_TABLET | Freq: Every day | ORAL | 3 refills | Status: DC

## 2016-10-11 NOTE — Telephone Encounter (Signed)
°*  STAT* If patient is at the pharmacy, call can be transferred to refill team.   1. Which medications need to be refilled? (please list name of each medication and dose if known)  Generic Lipitor   2. Which pharmacy/location (including street and city if local pharmacy) is medication to be sent to?[facility] [place]   3. Do they need a 30 day or 90 day supply? 90 day   Pt is at pharmacy now

## 2016-10-11 NOTE — Telephone Encounter (Signed)
S/w patient to remind him about getting 6 week follow up blood work. Patient stated he had come to our office on 10/07/16 and he was told that there was no record of him needing blood work here and to go to [facility]. Patient went to [facility] and the labs were not seen there. Patient came back to office and was told he was not scheduled for lab work. Orders are showing in EPIC for Future, Lab collect, [facility] which would be at [facility] as patient was advised to do on 08/26/16: "Notes Recorded by Vanessa Ralphs, RN on 08/26/2016 at 9:08 AM EST Results called to pt. Pt verbalized understanding. Repeat lab orders entered. Patient aware to go to [place] in 6 weeks for repeat labs."  Apologized for the inconvenience. Patient agreeable to go to [place] tomorrow morning to repeat labs. He will be fasting. Lab orders reentered into system.

## 2016-10-11 NOTE — Telephone Encounter (Signed)
Please see note below and advise correct dosage.

## 2016-10-11 NOTE — Telephone Encounter (Signed)
Requested Prescriptions   Signed Prescriptions Disp Refills  . atorvastatin (LIPITOR) 20 MG tablet 30 tablet 2    Sig: Take 1 tablet (20 mg total) by mouth daily at 6 PM.    Authorizing Provider: Rogelia Mire    Ordering User: Britt Bottom

## 2016-10-11 NOTE — Progress Notes (Signed)
Daily Session Note  Patient Details  Name: Phillip [name] MRN: [id number] Date of Birth: 09-05-1950 Referring Provider:   Flowsheet Row Cardiac Rehab from 08/02/2016 in [facility] Cardiac and Pulmonary Rehab  Referring Provider  End, Harrell Gave MD      Encounter Date: 10/11/2016  Check In:     Session Check In - 10/11/16 1808      Check-In   Location [facility]   Staff Present Gerlene Burdock, RN, BSN;Susanne Bice, RN, BSN, Laveda Norman, BS, ACSM CEP, Exercise Physiologist   Supervising physician immediately available to respond to emergencies See telemetry face sheet for immediately available ER MD   Medication changes reported     No   Fall or balance concerns reported    No   Warm-up and Cool-down Performed as group-led instruction   Resistance Training Performed No   VAD Patient? No     Pain Assessment   Currently in Pain? No/denies         History  Smoking Status  . Never Smoker  Smokeless Tobacco  . Never Used    Goals Met:  Proper associated with RPD/PD & O2 Sat Exercise tolerated well  Goals Unmet:  Not Applicable  Comments:     Dr. Emily Filbert is Medical Director for [place] and [facility].

## 2016-10-11 NOTE — Telephone Encounter (Signed)
Received fax from the pharmacy requesting a 10 mg refill for atorvastatin to replace the 20 mg.

## 2016-10-11 NOTE — Telephone Encounter (Signed)
S/w with patient. He is taking atorvastatin 10 mg by mouth daily. He is currently cutting his 20 mg tablets in half and taking 1/2 tablet daily until he runs out. He stated he does not need a refill right now and will ask for one at his OV on 11/02/16 with Dr End.  Change in atorvastatin dosage was made on 08/26/16: "Notes Recorded by Rogelia Mire, NP on 08/26/2016 at 8:11 AM EST He has really responded to lipitor therapy - LDL only 22. TC 67. He may cut back on lipitor to 10 mg daily. Alk phos mildly elevated. Please f/u lft's in 6 wks."   No refill to be sent in today.

## 2016-10-13 ENCOUNTER — Encounter: Payer: Self-pay | Admitting: *Deleted

## 2016-10-13 DIAGNOSIS — Z955 Presence of coronary angioplasty implant and graft: Secondary | ICD-10-CM | POA: Diagnosis not present

## 2016-10-13 NOTE — Progress Notes (Signed)
Daily Session Note  Patient Details  Name: Phillip [name] MRN: [id number] Date of Birth: 29-Sep-1950 Referring Provider:   Flowsheet Row Cardiac Rehab from 08/02/2016 in [facility] Cardiac and Pulmonary Rehab  Referring Provider  End, Harrell Gave MD      Encounter Date: 10/13/2016  Check In:     Session Check In - 10/13/16 1648      Check-In   Location [facility] Re[facility]   Staff Present Levell July RN BSN;Carroll Enterkin, RN, Vickki Hearing, BA, ACSM CEP, Exercise Physiologist   Supervising physician immediately available to respond to emergencies See telemetry face sheet for immediately available ER MD   Medication changes reported     No   Fall or balance concerns reported    No   Tobacco Cessation No Change   Warm-up and Cool-down Performed on first and last piece of equipment   Resistance Training Performed Yes   VAD Patient? No     Pain Assessment   Currently in Pain? No/denies   Multiple Pain Sites No         History  Smoking Status  . Never Smoker  Smokeless Tobacco  . Never Used    Goals Met:  Independence with exercise equipment Exercise tolerated well No report of cardiac concerns or symptoms Strength training completed today  Goals Unmet:  Not Applicable  Comments: Pt able to follow exercise prescription today without complaint.  Will continue to monitor for progression.    Dr. Emily Filbert is Medical Director for [place] and [facility].

## 2016-10-13 NOTE — Progress Notes (Signed)
Cardiac Individual Treatment Plan  Patient Details  Name: Phillip [name] MRN: [id number] Date of Birth: 1951-05-30 Referring Provider:   Flowsheet Row Cardiac Rehab from 08/02/2016 in [facility] Cardiac and Pulmonary Rehab  Referring Provider  End, Harrell Gave MD      Initial Encounter Date:  Flowsheet Row Cardiac Rehab from 08/02/2016 in [facility] Cardiac and Pulmonary Rehab  Date  08/02/16  Referring Provider  End, Harrell Gave MD      Visit Diagnosis: Status post coronary artery stent placement  Patient's Home Medications on Admission:  Current Outpatient Prescriptions:  .  albuterol (PROVENTIL HFA;VENTOLIN HFA) 108 (90 Base) MCG/ACT inhaler, Inhale 2 puffs into the lungs every 6 (six) hours as needed for wheezing or shortness of breath., Disp: 1 Inhaler, Rfl: 0 .  AMBULATORY NON FORMULARY MEDICATION, Take 90 mg by mouth 2 (two) times daily. Medication Name: BRILINTA 90 MG bid (TWILIGHT Research Study PROVIDED), Disp: , Rfl:  .  AMBULATORY NON FORMULARY MEDICATION, Take 81 mg by mouth daily. Medication Name: ASPIRIN 81 mg Daily (TWILIGHT Research study PROVIDED), Disp: , Rfl:  .  Ascorbic Acid (VITAMIN C PO), Take 1 tablet by mouth daily., Disp: , Rfl:  .  atorvastatin (LIPITOR) 10 MG tablet, Take 1 tablet (10 mg total) by mouth daily., Disp: 90 tablet, Rfl: 3 .  azithromycin (ZITHROMAX Z-PAK) 250 MG tablet, 2 tab by mouth day 1, then 1 per day, Disp: 6 tablet, Rfl: 1 .  folic acid (FOLVITE) 678 MCG tablet, Take 400 mcg by mouth daily., Disp: , Rfl:  .  Multiple Vitamins-Minerals (ZINC PO), Take 1 tablet by mouth daily., Disp: , Rfl:  .  nitroGLYCERIN (NITROSTAT) 0.4 MG SL tablet, Place 1 tablet (0.4 mg total) under the tongue every 5 (five) minutes as needed for chest pain. Max: 3 doses., Disp: 30 tablet, Rfl: 0 .  omeprazole (PRILOSEC) 40 MG capsule, Take 40 mg by mouth daily. , Disp: , Rfl:  .  saw palmetto 80 MG capsule, Take 80 mg by mouth daily. , Disp: , Rfl:  .  vitamin B-12 500 MCG  tablet, Take 1 tablet (500 mcg total) by mouth daily., Disp: 30 tablet, Rfl: 0  Past Medical History: Past Medical History:  Diagnosis Date  . Acute bronchitis 10/11/2015  . Allergic rhinitis   . Arthritis   . CAD (coronary artery disease)    a. 06/2016 Echo: EF 55-60%, no rwma, mild AI, triv MR/TR/PR, lipomatous hypertrophy of atrial septum;  b. 06/2016 Ex MV: EF 56%, large, partially reversible anteroseptal, inferoseptal, inferior,and apical perfusion defect - borderline for ischemia (TID 1.64) - equivocal study;  c. 06/2016 PCI: LM nl, LAD 25p/84m(3.0x24 Promus Premier MR DES), LCX min irregs, RCA nl.  . Colon polyps    a. Severe dysplasia in a right-sided polyp [place] surgically in [place] 2006--> final pathology showed this was not a cancer.  Follow up colonoscopy at one year interval was normal.  . Erectile dysfunction   . GERD (gastroesophageal reflux disease)    H/o Barrett's esophagus, orginally diagnosed in 2006, biopsies showed no dysplasia; follow up EGD also showed no dysplasia  . History of BPH   . Lyme disease    x2  . Prostate infection     Tobacco Use: History  Smoking Status  . Never Smoker  Smokeless Tobacco  . Never Used    Labs: Recent Review Flowsheet Data    Labs for ITP Cardiac and Pulmonary Rehab Latest Ref Rng & Units 02/05/2013 07/21/2015 07/14/2016 08/25/2016  10/11/2016   Cholestrol 0 - 200 mg/dL 128 105 102 67(L) 68   LDLCALC 0 - 99 mg/dL 71 56 55 22 27   HDL >40 mg/dL 24.00(L) 25.10(L) 18(L) 26(L) 24(L)   Trlycerides <150 mg/dL 165.0(H) 121.0 147 95 86   Hemoglobin A1c 4.8 - 5.6 % - - 5.6 - -       Exercise Target Goals:    Exercise Program Goal: Individual exercise prescription set with THRR, safety & activity barriers. Participant demonstrates ability to understand and report RPE using BORG scale, to self-measure pulse accurately, and to acknowledge the importance of the exercise prescription.  Exercise Prescription Goal: Starting with  aerobic activity 30 plus minutes a day, 3 days per week for initial exercise prescription. Provide home exercise prescription and guidelines that participant acknowledges understanding prior to discharge.  Activity Barriers & Risk Stratification:     Activity Barriers & Cardiac Risk Stratification - 08/02/16 1421      Activity Barriers & Cardiac Risk Stratification   Activity Barriers Arthritis;Back Problems;Joint Problems  mild arthritis hands, history torn cartiledge knee, back surgery 1992, some disc issues. Does not lift over 50 lb   Cardiac Risk Stratification High      6 Minute Walk:     6 Minute Walk    Row Name 08/02/16 1433         6 Minute Walk   Phase Initial     Distance 1468 feet     Walk Time 6 minutes     # of Rest Breaks 0     MPH 2.78     METS 3.55     RPE 12     VO2 Peak 12.44     Symptoms No     Resting HR 87 bpm     Resting BP 126/64     Max Ex. HR 120 bpm     Max Ex. BP 126/70     2 Minute Post BP 112/60        Oxygen Initial Assessment:   Oxygen Re-Evaluation:   Oxygen Discharge (Final Oxygen Re-Evaluation):   Initial Exercise Prescription:     Initial Exercise Prescription - 08/02/16 1400      Date of Initial Exercise RX and Referring Provider   Date 08/02/16   Referring Provider End, Harrell Gave MD     Treadmill   MPH 2.5   Grade 1   Minutes 15   METs 3.26     Elliptical   Level 1   Speed 3.3   Minutes 15     REL-XR   Level 4   Watts --  speed > 50 spm   Minutes 15   METs 2     Prescription Details   Frequency (times per week) 3   Duration Progress to 45 minutes of aerobic exercise without signs/symptoms of physical distress     Intensity   THRR 40-80% of Max Heartrate 114-141   Ratings of Perceived Exertion 11-15   Perceived Dyspnea 0-4     Progression   Progression Continue to progress workloads to maintain intensity without signs/symptoms of physical distress.     Resistance Training   Training  Prescription Yes   Weight 4 lbs   Reps 10-12      Perform Capillary Blood Glucose checks as needed.  Exercise Prescription Changes:     Exercise Prescription Changes    Row Name 08/02/16 1400 08/13/16 0900 08/26/16 1200 09/09/16 1100 09/15/16 1700     Response to  Exercise   Blood Pressure (Admit) 126/64 140/80 114/78 108/74  -   Blood Pressure (Exercise) 126/70 148/68 128/80 136/78  -   Blood Pressure (Exit) 112/60 112/72 106/68 128/82  -   Heart Rate (Admit) 87 bpm 75 bpm 76 bpm 69 bpm  -   Heart Rate (Exercise) 120 bpm 127 bpm 125 bpm 130 bpm  -   Heart Rate (Exit) 64 bpm 101 bpm 88 bpm 70 bpm  -   Oxygen Saturation (Admit) 100 %  -  -  -  -   Oxygen Saturation (Exercise) 100 %  -  -  -  -   Rating of Perceived Exertion (Exercise) 12 14 13 15   -   Symptoms none none none none  -   Duration  - Progress to 45 minutes of aerobic exercise without signs/symptoms of physical distress Progress to 45 minutes of aerobic exercise without signs/symptoms of physical distress Progress to 45 minutes of aerobic exercise without signs/symptoms of physical distress  -   Intensity  - THRR unchanged THRR unchanged THRR unchanged  -     Progression   Progression  - Continue to progress workloads to maintain intensity without signs/symptoms of physical distress. Continue to progress workloads to maintain intensity without signs/symptoms of physical distress. Continue to progress workloads to maintain intensity without signs/symptoms of physical distress.  -   Average METs  - 4.58 4.3  -  -     Resistance Training   Training Prescription  - Yes Yes Yes  -   Weight  - 4 lbs 4 5  -   Reps  - 10-12 10-12 10-12  -     Interval Training   Interval Training  - No No No  -     Treadmill   MPH  - 2.5  -  -  -   Grade  - 1  -  -  -   Minutes  - 15  -  -  -   METs  - 3.26  -  -  -     Elliptical   Level  - 1 1 1   -   Speed  - 3.3 3.3 3.3  -   Minutes  - 15 15 15   -     REL-XR   Level  - 4 4 6    -   Minutes  - 15 15 15   -   METs  - 5.9 4.3  -  -     Home Exercise Plan   Plans to continue exercise at  -  -  -  - [employer] (comment)   Frequency  -  -  -  - Add 2 additional days to program exercise sessions.     Exercise Review   Progression -  walk test results Yes  -  -  -   Row Name 10/07/16 1100             Response to Exercise   Blood Pressure (Admit) 114/62       Heart Rate (Admit) 78 bpm       Heart Rate (Exercise) 121 bpm       Heart Rate (Exit) 77 bpm       Rating of Perceived Exertion (Exercise) 13       Symptoms none       Duration Progress to 45 minutes of aerobic exercise without signs/symptoms of physical distress       Intensity THRR unchanged  Progression   Progression Continue to progress workloads to maintain intensity without signs/symptoms of physical distress.       Average METs 6.3         Resistance Training   Training Prescription Yes       Weight 5       Reps 10-12         Elliptical   Level 1       Minutes 15         REL-XR   Level 6       Minutes 15       METs 6.3          Exercise Comments:     Exercise Comments    Row Name 08/02/16 1440 08/04/16 1746 08/13/16 0954 08/26/16 1238 09/09/16 1110   Exercise Comments Elta Guadeloupe wants to increase his stamina and energy.  He also wants to lose weight with a goal of 210 lbs.  First full day of exercise!  Patient was oriented to gym and equipment including functions, settings, policies, and procedures.  Patient's individual exercise prescription and treatment plan were reviewed.  All starting workloads were established based on the results of the 6 minute walk test done at initial orientation visit.  The plan for exercise progression was also introduced and progression will be customized based on patient's performance and goals. Elta Guadeloupe is off to a good start with rehab.  He is doing well and already making some progress.  We will continue to track his progression. Elta Guadeloupe is  progressing well with exercise. Elta Guadeloupe is progressing well and has increased his intensity on the XR.   [place] Name 09/15/16 1710 09/23/16 1111 10/07/16 1142       Exercise Comments Elta Guadeloupe may join  [employer] or another facility to exercise after [employer]. Elta Guadeloupe has been out of town since last update. Elta Guadeloupe consistently works in his THR range and has improved MET levels.        Exercise Goals and Review:   Exercise Goals Re-Evaluation :   Discharge Exercise Prescription (Final Exercise Prescription Changes):     Exercise Prescription Changes - 10/07/16 1100      Response to Exercise   Blood Pressure (Admit) 114/62   Heart Rate (Admit) 78 bpm   Heart Rate (Exercise) 121 bpm   Heart Rate (Exit) 77 bpm   Rating of Perceived Exertion (Exercise) 13   Symptoms none   Duration Progress to 45 minutes of aerobic exercise without signs/symptoms of physical distress   Intensity THRR unchanged     Progression   Progression Continue to progress workloads to maintain intensity without signs/symptoms of physical distress.   Average METs 6.3     Resistance Training   Training Prescription Yes   Weight 5   Reps 10-12     Elliptical   Level 1   Minutes 15     REL-XR   Level 6   Minutes 15   METs 6.3      Nutrition:  Target Goals: Understanding of nutrition guidelines, daily intake of sodium <1518m, cholesterol <2034m calories 30% from fat and 7% or less from saturated fats, daily to have 5 or more servings of fruits and vegetables.  Biometrics:     Pre Biometrics - 08/02/16 1443      Pre Biometrics   Height 6' 1.8" (1.875 m)   Weight 231 lb (104.8 kg)   Waist Circumference 43 inches   Hip Circumference 43 inches   Waist  to Hip Ratio 1 %   BMI (Calculated) 29.9   Single Leg Stand 7.85 seconds       Nutrition Therapy Plan and Nutrition Goals:     Nutrition Therapy & Goals - 09/14/16 1139      Nutrition Therapy   Diet Instructed on a meal plan based on 2000  calories including heart healthy dietary guidelines   Drug/Food Interactions Statins/Certain Fruits   Protein (specify units) 8   Fiber 20 grams   Whole Grain Foods 3 servings   Saturated Fats 14 max. grams   Fruits and Vegetables 5 servings/day   Sodium 2000 grams  1500 mg -ideal     Personal Nutrition Goals   Nutrition Goal Read labels for saturated fat, trans fat and sodium.   Personal Goal #2 Increase water intake with goal of at least 32 oz. per day for goal of 6-8 cups of fluids daily.   Personal Goal #3 Increase fruit and vegetable intake with eventual goal of 5 servings daily.   Personal Goal #4 Check label on Plexus supplement.   Comments Patient had made many positive diet changes including lowering sodium intake and decreasing foods high in saturated fat.     Intervention Plan   Intervention Prescribe, educate and counsel regarding individualized specific dietary modifications aiming towards targeted core components such as weight, hypertension, lipid management, diabetes, heart failure and other comorbidities.   Expected Outcomes Short Term Goal: Understand basic principles of dietary content, such as calories, fat, sodium, cholesterol and nutrients.;Short Term Goal: A plan has been developed with personal nutrition goals set during dietitian appointment.;Long Term Goal: Adherence to prescribed nutrition plan.      Nutrition Discharge: Rate Your Plate Scores:     Nutrition Assessments - 08/02/16 1415      Rate Your Plate Scores   Pre Score 60   Pre Score % 66.7 %      Nutrition Goals Re-Evaluation:     Nutrition Goals Re-Evaluation    Row Name 08/23/16 1741 09/27/16 1745           Goals   Nutrition Goal Cont to eat healthy.   -      Comment I offered him several appts with the Cardiac Rehab Registered Dietician. He said he will let us know if he decides he wants to meet with the dietiican.  Elta Guadeloupe said he has been reading labels.         Personal Goal #2  Re-Evaluation   Personal Goal #2  - has increased his water intake.        Personal Goal #3 Re-Evaluation   Personal Goal #3  - Elta Guadeloupe said he has tried to increase his fruite and vegetable intake.         Personal Goal #4 Re-Evaluation   Personal Goal #4  - Elta Guadeloupe said the Dietician helped him check the label on his Plexus supplement.          Nutrition Goals Discharge (Final Nutrition Goals Re-Evaluation):     Nutrition Goals Re-Evaluation - 09/27/16 1745      Goals   Comment Elta Guadeloupe said he has been reading labels.      Personal Goal #2 Re-Evaluation   Personal Goal #2 has increased his water intake.     Personal Goal #3 Re-Evaluation   Personal Goal #3 Elta Guadeloupe said he has tried to increase his fruite and vegetable intake.      Personal Goal #4 Re-Evaluation   Personal Goal #4 Elta Guadeloupe  said the Dietician helped him check the label on his Plexus supplement.       Psychosocial: Target Goals: Acknowledge presence or absence of significant depression and/or stress, maximize coping skills, provide positive support system. Participant is able to verbalize types and ability to use techniques and skills needed for reducing stress and depression.   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 Yes  wife and friends     Barriers   Psychosocial barriers to participate in program There are no identifiable barriers or psychosocial needs.;The patient should benefit from training in stress management and relaxation.     Screening Interventions   Interventions Encouraged to exercise      Quality of Life Scores:      Quality of Life - 08/02/16 1420      Quality of Life Scores   Health/Function Pre 26.3 %   Socioeconomic Pre 30 %   Psych/Spiritual Pre 27.21  %   Family Pre 28.8 %   GLOBAL Pre 27.62 %      PHQ-9: Recent Review Flowsheet Data    Depression screen [facility] 2/9 08/02/2016   Decreased Interest 0   Down, Depressed, Hopeless 0   PHQ - 2 Score 0   Altered sleeping 0   Tired, decreased energy 1   Change in appetite 0   Feeling bad or failure about yourself  0   Trouble concentrating 0   Moving slowly or fidgety/restless 0   Suicidal thoughts 0   PHQ-9 Score 1   Difficult doing work/chores Not difficult at all     Interpretation of Total Score  Total Score Depression Severity:  1-4 = Minimal depression, 5-9 = Mild depression, 10-14 = Moderate depression, 15-19 = Moderately severe depression, 20-27 = Severe depression   Psychosocial Evaluation and Intervention:     Psychosocial Evaluation - 08/30/16 1738      Psychosocial Evaluation & Interventions   Interventions Encouraged to exercise with the program and follow exercise prescription;Stress management education   Comments Counselor met with Mr. A today for initial psychosocial evaluation.  He is a 66 year old who had a stent inserted late November.  He is a Theme park manager of a church; has been married 81 years and has adult children who are all his strong support system.  He states that he sleeps well and has a good appetite.  He denies a history of depression or anxiety or current symptoms; although he mentioned low energy lately - hoping the recent adjustment in medication will correct that.  He reports typically being in a positive mood.  His current stressors are a close friend and member of his congregation was "killed in a car accident" recently and that has been difficult.  Also, there is some family stress related to his daughter losing her job.  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  His goals for this program are to lose some weight and to increase his energy levels.  He will continue playing golf; walking and may join the gym to work out with his spouse upon completion of this  program.  Staff will continue to follow with Mr. A.      Psychosocial Re-Evaluation:  Psychosocial Re-Evaluation    Row Name 08/23/16 1746 09/15/16 1708 09/27/16 1743 10/04/16 1729       Psychosocial Re-Evaluation   Comments When I mentioned to Vernia Buff that we have had a lot of ministers lately in Cardiac Rehab he acknowledged that it is a stressful job. He said he has deacons that help him but he has a lot of sick members. He was also in bed sick for one week last week so hopes to get to stay in [place] without any more flu episodes.  Counselor follow up with Mr. Feria reporting feeling much more energy since coming into this program. This could be a result of his Dr. Gerrianne Scale one of his Rx (Lipitor), but also his stress levels are down as a result of working out more consistently.  Counselor followed on his recent losses of loved ones and Mr. Loni Muse states he and his congregation are doing better with time.  The financial stress he was facing has resolved itself and he is relieved by that as well.  Counselor commended Mr. A on all his hard work and consistency in exercise and healthier coping strategies.   Elta Guadeloupe said he has a stressful job since he is the only paid staff member but he does have some volunteer help. Elta Guadeloupe recently returned from having some time off of work and got refreshed.  Counselor follow up with Mr. Loni Muse today stating "busy weekend" with multiple church activities and a funeral on Friday.  He has noted more awareness of carrying stress in his neck and shoulders and is using strategies (moving more) to help get some relief.  He reports sleeping well most of the time and continuing to enjoy this program.  Counselor commended him on his progress and use of stress management techniques and learning to relax.     Interventions  -  -  - Stress management education;Relaxation education       Psychosocial Discharge (Final Psychosocial Re-Evaluation):     Psychosocial  Re-Evaluation - 10/04/16 1729      Psychosocial Re-Evaluation   Comments Counselor follow up with Mr. A today stating "busy weekend" with multiple church activities and a funeral on Friday.  He has noted more awareness of carrying stress in his neck and shoulders and is using strategies (moving more) to help get some relief.  He reports sleeping well most of the time and continuing to enjoy this program.  Counselor commended him on his progress and use of stress management techniques and learning to relax.    Interventions Stress management education;Relaxation education      Vocational Rehabilitation: Provide vocational rehab assistance to qualifying candidates.   Vocational Rehab Evaluation & Intervention:     Vocational Rehab - 08/02/16 1424      Initial Vocational Rehab Evaluation & Intervention   Assessment shows need for Vocational Rehabilitation No      Education: Education Goals: Education classes will be provided on a weekly basis, covering required topics. Participant will state understanding/return demonstration of topics presented.  Learning Barriers/Preferences:     Learning Barriers/Preferences - 08/02/16 1441      Learning Barriers/Preferences   Learning Barriers None   Learning Preferences None      Education Topics: General Nutrition Guidelines/Fats and Fiber: -Group instruction provided by verbal, written material, models and posters to present the general guidelines for heart healthy nutrition. Gives an explanation and review of dietary fats and fiber. Flowsheet Row Cardiac Rehab from 10/04/2016 in [facility] Cardiac and  Pulmonary Rehab  Date  08/23/16  Educator  Beverly Gust  Instruction Review Code  2- meets goals/outcomes      Controlling Sodium/Reading Food Labels: -Group verbal and written material supporting the discussion of sodium use in heart healthy nutrition. Review and explanation with models, verbal and written materials for utilization of the  food label. Flowsheet Row Cardiac Rehab from 10/04/2016 in [facility] Cardiac and Pulmonary Rehab  Date  08/30/16  Educator  PI  Instruction Review Code  2- meets goals/outcomes      Exercise Physiology & Risk Factors: - Group verbal and written instruction with models to review the exercise physiology of the cardiovascular system and associated critical values. Details cardiovascular disease risk factors and the goals associated with each risk factor. Flowsheet Row Cardiac Rehab from 10/04/2016 in [facility] Cardiac and Pulmonary Rehab  Date  09/06/16  Educator  [facility]  Instruction Review Code  2- meets goals/outcomes      Aerobic Exercise & Resistance Training: - Gives group verbal and written discussion on the health impact of inactivity. On the components of aerobic and resistive training programs and the benefits of this training and how to safely progress through these programs. Flowsheet Row Cardiac Rehab from 10/04/2016 in [facility] Cardiac and Pulmonary Rehab  Date  09/08/16  Educator  AS  Instruction Review Code  2- meets goals/outcomes      Flexibility, Balance, General Exercise Guidelines: - Provides group verbal and written instruction on the benefits of flexibility and balance training programs. Provides general exercise guidelines with specific guidelines to those with heart or lung disease. Demonstration and skill practice provided. Flowsheet Row Cardiac Rehab from 10/04/2016 in [facility] Cardiac and Pulmonary Rehab  Date  09/13/16  Educator  SB  Instruction Review Code  2- meets goals/outcomes      Stress Management: - Provides group verbal and written instruction about the health risks of elevated stress, cause of high stress, and healthy ways to reduce stress.   Depression: - Provides group verbal and written instruction on the correlation between heart/lung disease and depressed mood, treatment options, and the stigmas associated with seeking treatment. Flowsheet Row Cardiac Rehab from  10/04/2016 in [facility] Cardiac and Pulmonary Rehab  Date  08/25/16  Educator  [facility]  Instruction Review Code  2- meets goals/outcomes      Anatomy & Physiology of the Heart: - Group verbal and written instruction and models provide basic cardiac anatomy and physiology, with the coronary electrical and arterial systems. Review of: AMI, Angina, Valve disease, Heart Failure, Cardiac Arrhythmia, Pacemakers, and the ICD.   Cardiac Procedures: - Group verbal and written instruction and models to describe the testing methods done to diagnose heart disease. Reviews the outcomes of the test results. Describes the treatment choices: Medical Management, Angioplasty, or Coronary Bypass Surgery. Flowsheet Row Cardiac Rehab from 10/04/2016 in [facility] Cardiac and Pulmonary Rehab  Date  09/27/16  Educator  CE  Instruction Review Code  2- meets goals/outcomes      Cardiac Medications: - Group verbal and written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

## 2016-10-14 ENCOUNTER — Encounter: Payer: Medicare Other | Attending: Cardiovascular Disease

## 2016-10-14 ENCOUNTER — Other Ambulatory Visit (INDEPENDENT_AMBULATORY_CARE_PROVIDER_SITE_OTHER): Payer: Medicare Other

## 2016-10-14 VITALS — Ht 73.8 in | Wt 219.0 lb

## 2016-10-14 DIAGNOSIS — Z955 Presence of coronary angioplasty implant and graft: Secondary | ICD-10-CM

## 2016-10-14 DIAGNOSIS — Z125 Encounter for screening for malignant neoplasm of prostate: Secondary | ICD-10-CM | POA: Diagnosis not present

## 2016-10-14 DIAGNOSIS — Z79899 Other long term (current) drug therapy: Secondary | ICD-10-CM

## 2016-10-14 LAB — CBC WITH DIFFERENTIAL/PLATELET
BASOS PCT: 0.6 % (ref 0.0–3.0)
Basophils Absolute: 0 10*3/uL (ref 0.0–0.1)
EOS ABS: 0.1 10*3/uL (ref 0.0–0.7)
Eosinophils Relative: 1.6 % (ref 0.0–5.0)
HEMATOCRIT: 42 % (ref 39.0–52.0)
Hemoglobin: 13.9 g/dL (ref 13.0–17.0)
LYMPHS ABS: 1.5 10*3/uL (ref 0.7–4.0)
LYMPHS PCT: 33.5 % (ref 12.0–46.0)
MCHC: 33.1 g/dL (ref 30.0–36.0)
MCV: 84.6 fl (ref 78.0–100.0)
MONOS PCT: 9.8 % (ref 3.0–12.0)
Monocytes Absolute: 0.4 10*3/uL (ref 0.1–1.0)
NEUTROS ABS: 2.4 10*3/uL (ref 1.4–7.7)
Neutrophils Relative %: 54.5 % (ref 43.0–77.0)
PLATELETS: 131 10*3/uL — AB (ref 150.0–400.0)
RBC: 4.96 Mil/uL (ref 4.22–5.81)
RDW: 17.5 % — AB (ref 11.5–15.5)
WBC: 4.3 10*3/uL (ref 4.0–10.5)

## 2016-10-14 LAB — BASIC METABOLIC PANEL
BUN: 12 mg/dL (ref 6–23)
CHLORIDE: 107 meq/L (ref 96–112)
CO2: 29 meq/L (ref 19–32)
CREATININE: 1.2 mg/dL (ref 0.40–1.50)
Calcium: 9.2 mg/dL (ref 8.4–10.5)
GFR: 64.43 mL/min (ref >60.00)
GLUCOSE: 103 mg/dL — AB (ref 70–99)
Potassium: 4.4 mEq/L (ref 3.5–5.1)
Sodium: 140 mEq/L (ref 135–145)

## 2016-10-14 LAB — PSA: PSA: 0.68 ng/mL (ref 0.10–4.00)

## 2016-10-14 NOTE — Progress Notes (Signed)
Daily Session Note  Patient Details  Name: Phillip [name] MRN: [id number] Date of Birth: 08/25/1950 Referring Provider:   Flowsheet Row Cardiac Rehab from 08/02/2016 in [facility] Cardiac and Pulmonary Rehab  Referring Provider  End, Harrell Gave MD      Encounter Date: 10/14/2016  Check In:     Session Check In - 10/14/16 1709      Check-In   Location [facility]-Cardiac & Pulmonary Rehab   Staff Present Levell July RN Moises Blood, BS, ACSM CEP, Exercise Physiologist;Iven Earnhart Oletta Darter, [place], ACSM CEP, Exercise Physiologist   Supervising physician immediately available to respond to emergencies See telemetry face sheet for immediately available ER MD   Medication changes reported     No   Fall or balance concerns reported    No   Tobacco Cessation No Change   Warm-up and Cool-down Performed on first and last piece of equipment   Resistance Training Performed Yes   VAD Patient? No     Pain Assessment   Currently in Pain? No/denies   Multiple Pain Sites No         History  Smoking Status  . Never Smoker  Smokeless Tobacco  . Never Used    Goals Met:  Independence with exercise equipment Exercise tolerated well No report of cardiac concerns or symptoms Strength training completed today  Goals Unmet:  Not Applicable  Comments:      6 Minute Walk    Row Name 08/02/16 1433 10/14/16 1711       6 Minute Walk   Phase Initial Discharge    Distance 1468 feet 1742 feet    Distance % Change  - 18.6 %    Walk Time 6 minutes 6 minutes    # of Rest Breaks 0 0    MPH 2.78 3.29    METS 3.55 3.29    RPE 12 13    VO2 Peak 12.44 1.5    Symptoms No No    Resting HR 87 bpm 56 bpm    Resting BP 126/64 134/54    Max Ex. HR 120 bpm 104 bpm    Max Ex. BP 126/70 136/64    2 Minute Post BP 112/60  -         Dr. Emily Filbert is Medical Director for [place] and [facility].

## 2016-10-18 ENCOUNTER — Encounter: Payer: Medicare Other | Admitting: *Deleted

## 2016-10-18 DIAGNOSIS — Z955 Presence of coronary angioplasty implant and graft: Secondary | ICD-10-CM | POA: Diagnosis not present

## 2016-10-18 NOTE — Progress Notes (Signed)
Daily Session Note  Patient Details  Name: Phillip [name] MRN: [id number] Date of Birth: [birth year] Referring Provider:   Flowsheet Row Cardiac Rehab from 08/02/2016 in [facility] Cardiac and Pulmonary Rehab  Referring Provider  End, Harrell Gave MD      Encounter Date: 10/18/2016  Check In:     Session Check In - 10/18/16 1619      Check-In   Location [facility]-Cardiac & Pulmonary Rehab   Staff Present Earlean Shawl, BS, ACSM CEP, Exercise Physiologist;Hillery Bhalla Brayton El, DPT, CEEA;Heath Lark, RN, BSN, CCRP   Supervising physician immediately available to respond to emergencies See telemetry face sheet for immediately available ER MD   Medication changes reported     No   Fall or balance concerns reported    No   Tobacco Cessation --   Warm-up and Cool-down Performed on first and last piece of equipment   Resistance Training Performed Yes   VAD Patient? No     Pain Assessment   Currently in Pain? No/denies   Multiple Pain Sites No         History  Smoking Status  . Never Smoker  Smokeless Tobacco  . Never Used    Goals Met:  Independence with exercise equipment  Goals Unmet:  Not Applicable  Comments: Patient completed exercise prescription and all exercise goals during rehab session. The exercise was tolerated well and the patient is progressing in the program.    Dr. Emily Filbert is Medical Director for [place] and [facility].

## 2016-10-19 ENCOUNTER — Encounter: Payer: Self-pay | Admitting: *Deleted

## 2016-10-19 ENCOUNTER — Other Ambulatory Visit: Payer: Self-pay | Admitting: *Deleted

## 2016-10-19 DIAGNOSIS — Z006 Encounter for examination for normal comparison and control in clinical research program: Secondary | ICD-10-CM

## 2016-10-19 MED ORDER — AMBULATORY NON FORMULARY MEDICATION: 81.0000 mg | Freq: Every day | Status: DC

## 2016-10-19 NOTE — Progress Notes (Signed)
TWILIGHT research study month 3 Randomization visit completed. Patient denies any bleeding or other adverse events. He has been compliant with study provided medication. Today he has been randomized to ASPIRIN 81 mg Daily or PLACEBO as part of the TWILIGHT research protocol. He was dispensed the following bottle numbers: [id number]; [phone]. The next study required visit is due no later than 25/SEP/2018. Questions were encouraged and answered.

## 2016-10-20 ENCOUNTER — Ambulatory Visit (INDEPENDENT_AMBULATORY_CARE_PROVIDER_SITE_OTHER): Payer: Medicare Other | Admitting: Family Medicine

## 2016-10-20 ENCOUNTER — Encounter: Payer: Self-pay | Admitting: Family Medicine

## 2016-10-20 VITALS — BP 96/70 | HR 62 | Temp 97.8°F | Ht 73.0 in | Wt 219.2 lb

## 2016-10-20 DIAGNOSIS — I2510 Atherosclerotic heart disease of native coronary artery without angina pectoris: Secondary | ICD-10-CM | POA: Diagnosis not present

## 2016-10-20 DIAGNOSIS — N183 Chronic kidney disease, stage 3 unspecified: Secondary | ICD-10-CM

## 2016-10-20 DIAGNOSIS — Z955 Presence of coronary angioplasty implant and graft: Secondary | ICD-10-CM

## 2016-10-20 DIAGNOSIS — D696 Thrombocytopenia, unspecified: Secondary | ICD-10-CM | POA: Diagnosis not present

## 2016-10-20 NOTE — Progress Notes (Signed)
Daily Session Note  Patient Details  Name: Phillip [name] MRN: [id number] Date of Birth: 1951-06-19 Referring Provider:   Flowsheet Row Cardiac Rehab from 08/02/2016 in [facility] Cardiac and Pulmonary Rehab  Referring Provider  End, Harrell Gave MD      Encounter Date: 10/20/2016  Check In:     Session Check In - 10/20/16 1644      Check-In   Location [facility]-Cardiac & Pulmonary Rehab   Staff Present Levell July RN BSN;Rebecca Sickles, DPT, Burlene Arnt, BA, ACSM CEP, Exercise Physiologist   Supervising physician immediately available to respond to emergencies See telemetry face sheet for immediately available ER MD   Medication changes reported     No   Fall or balance concerns reported    No   Tobacco Cessation No Change   Warm-up and Cool-down Performed on first and last piece of equipment   Resistance Training Performed Yes   VAD Patient? No     Pain Assessment   Currently in Pain? No/denies   Multiple Pain Sites No         History  Smoking Status  . Never Smoker  Smokeless Tobacco  . Never Used    Goals Met:  Independence with exercise equipment Exercise tolerated well No report of cardiac concerns or symptoms Strength training completed today  Goals Unmet:  Not Applicable  Comments: Pt able to follow exercise prescription today without complaint.  Will continue to monitor for progression.    Dr. Emily Filbert is Medical Director for [place] and [facility].

## 2016-10-20 NOTE — Progress Notes (Signed)
Pre visit review using our clinic review tool, if applicable. No additional management support is needed unless otherwise documented below in the visit note. 

## 2016-10-20 NOTE — Progress Notes (Signed)
Dr. Frederico Hamman T. Ralph Benavidez, MD, [facility] and Sports Medicine [place], [address] Phone: [phone] Fax: [phone]  10/20/2016  Patient: Phillip [name], MRN: [id number], DOB: 05-02-1951, 66 y.o.  Primary Physician:  Owens Loffler, MD   Chief Complaint  Patient presents with  . Follow-up    3 month   Subjective:   Phillip [name] is a 66 y.o. very pleasant male patient who presents with the following:  F/u: CBC, lipid, hfp  Still not 100%  Here in f/u after PCI, f/u labwork.   F/u platelets and WBC - all of which have returned more to normal.   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   Wt Readings from Last 3 Encounters:  10/20/16 219 lb 4 oz (99.5 kg)  10/14/16 219 lb (99.3 kg)  08/14/16 223 lb (101.2 kg)     Past Medical History, Surgical History, Social History, Family History, Problem List, Medications, and Allergies have been reviewed and updated if relevant.  Patient Active Problem List   Diagnosis Date Noted  . Cough 08/14/2016  . Leukopenia 07/22/2016  . 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 Angina pectoris ([place]) 07/13/2016  . CKD (chronic kidney disease) stage 3, GFR 30-59 ml/min 07/13/2016  . Chest pain 07/12/2016  . Acute bronchitis 10/11/2015  . Renal insufficiency 07/21/2015  . Thrombocytopenia ([place]) 04/19/2012  . BARRETTS ESOPHAGUS 12/16/2008  . ERECTILE DYSFUNCTION 09/26/2008  . ALLERGIC RHINITIS 09/26/2008  . GERD 09/26/2008  . COLONIC POLYPS, HX OF 09/26/2008    Past Medical History:  Diagnosis Date  . Acute bronchitis 10/11/2015  . Allergic rhinitis   . Arthritis   . CAD (coronary artery disease)    a. 06/2016 Echo: EF 55-60%, no rwma, mild AI, triv MR/TR/PR, lipomatous hypertrophy of atrial septum;  b. 06/2016 Ex MV: EF 56%, large, partially reversible  anteroseptal, inferoseptal, inferior,and apical perfusion defect - borderline for ischemia (TID 1.64) - equivocal study;  c. 06/2016 PCI: LM nl, LAD 25p/4m (3.0x24 Promus Premier MR DES), LCX min irregs, RCA nl.  . Colon polyps    a. Severe dysplasia in a right-sided polyp removed surgically in [place], 2006--> final pathology showed this was not a cancer.  Follow up colonoscopy at one year interval was normal.  . Erectile dysfunction   . GERD (gastroesophageal reflux disease)    H/o Barrett's esophagus, orginally diagnosed in 2006, biopsies showed no dysplasia; follow up EGD also showed no dysplasia  . History of BPH   . Lyme disease    x2  . Prostate infection     Past Surgical History:  Procedure Laterality Date  . CARDIAC CATHETERIZATION N/A 07/14/2016   Procedure: Left Heart Cath and Coronary Angiography;  Surgeon: Sherren Mocha, MD;  Location: [place] CV [facility];  Service: Cardiovascular;  Laterality: N/A;  . CARDIAC CATHETERIZATION N/A 07/14/2016   Procedure: Coronary Stent Intervention;  Surgeon: Sherren Mocha, MD;  Location: [place] [facility];  Service: Cardiovascular;  Laterality: N/A;  . COLONOSCOPY  12/2008   Repeat 5 years  . ESOPHAGOGASTRODUODENOSCOPY  12/2008   Barrett's, repeat 3 years  . HEMICOLECTOMY  2006   Right, for severely dysplastic right colon polyp  . HERNIA REPAIR  1027   UMBILICAL  . LUMBAR DISC SURGERY  1992   L4-5  . TONSILLECTOMY  As a child  . TUMOR REMOVAL  2002   Fatty tumor (neck)  . UPPER GASTROINTESTINAL ENDOSCOPY    . VARICOSE VEIN SURGERY  1997   Removal, right    Social History   Social History  . Marital status: Married    Spouse name: N/A  . Number of children: 3  . Years of education: N/A   Occupational History  . [employer], [place]   Social History Main Topics  . Smoking status: Never Smoker  . Smokeless tobacco: Never Used  . Alcohol use No  . Drug use: No  . Sexual activity: Not  on file   Other Topics Concern  . Not on file   Social History Narrative   Lives in [place] with his wife.  Tax inspector.       Family History  Problem Relation Age of Onset  . Hypertension Father   . Diabetes Father   . Heart disease Father   . Lymphoma Mother   . Alcohol abuse Paternal Grandfather     Allergies  Allergen Reactions  . Penicillins     REACTION: Rash as a child - Tolerated AMOXICILLIN SEVERAL TIMES AS ADULT RECENTLY Has patient had a PCN reaction causing immediate rash, facial/tongue/throat swelling, SOB or lightheadedness with hypotension: YES Has patient had a PCN reaction causing severe rash involving mucus membranes or skin necrosis:NO Has patient had a PCN reaction that required hospitalization NO Has patient had a PCN reaction occurring within the last 10 years: NO If all of the above answers are "NO", then may proceed with     Medication list reviewed and updated in full in [place].   GEN: No acute illnesses, no fevers, chills. GI: No n/v/d, eating normally Pulm: No SOB Interactive and getting along well at home.  Otherwise, ROS is as per the HPI.  Objective:   BP 96/70   Pulse 62   Temp 97.8 F (36.6 C) (Oral)   Ht 6\' 1"  (1.854 m)   Wt 219 lb 4 oz (99.5 kg)   BMI 28.93 kg/m   GEN: WDWN, NAD, Non-toxic, A & O x 3 HEENT: Atraumatic, Normocephalic. Neck supple. No masses, No LAD. Ears and Nose: No external deformity. CV: RRR, No M/G/R. No JVD. No thrill. No extra heart sounds. PULM: CTA B, no wheezes, crackles, rhonchi. No retractions. No resp. distress. No accessory muscle use. EXTR: No c/c/e NEURO Normal gait.  PSYCH: Normally interactive. Conversant. Not depressed or anxious appearing.  Calm demeanor.   Laboratory and Imaging Data: Results for orders placed or performed in visit on 67/59/16  Basic metabolic panel  Result Value Ref Range   Sodium 140 135 - 145 mEq/L   Potassium 4.4 3.5 - 5.1 mEq/L    Chloride 107 96 - 112 mEq/L   CO2 29 19 - 32 mEq/L   Glucose, Bld 103 (H) 70 - 99 mg/dL   BUN 12 6 - 23 mg/dL   Creatinine, Ser 1.20 0.40 - 1.50 mg/dL   Calcium 9.2 8.4 - 10.5 mg/dL   GFR 64.43 >60.00 mL/min  CBC with Differential/Platelet  Result Value Ref Range   WBC 4.3 4.0 - 10.5 K/uL   RBC 4.96 4.22 - 5.81 Mil/uL   Hemoglobin 13.9 13.0 - 17.0 g/dL   HCT 42.0 39.0 - 52.0 %   MCV 84.6 78.0 - 100.0 fl   MCHC 33.1 30.0 - 36.0 g/dL   RDW 17.5 (H)  11.5 - 15.5 %   Platelets 131.0 (L) 150.0 - 400.0 K/uL   Neutrophils Relative % 54.5 43.0 - 77.0 %   Lymphocytes Relative 33.5 12.0 - 46.0 %   Monocytes Relative 9.8 3.0 - 12.0 %   Eosinophils Relative 1.6 0.0 - 5.0 %   Basophils Relative 0.6 0.0 - 3.0 %   Neutro Abs 2.4 1.4 - 7.7 K/uL   Lymphs Abs 1.5 0.7 - 4.0 K/uL   Monocytes Absolute 0.4 0.1 - 1.0 K/uL   Eosinophils Absolute 0.1 0.0 - 0.7 K/uL   Basophils Absolute 0.0 0.0 - 0.1 K/uL  PSA  Result Value Ref Range   PSA 0.68 0.10 - 4.00 ng/mL     Assessment and Plan:   Thrombocytopenia (HCC)  CKD (chronic kidney disease) stage 3, GFR 30-59 ml/min  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 albuterol (PROVENTIL HFA;VENTOLIN HFA) 108 (90 Base) MCG/ACT inhaler Prescription never filled  . azithromycin (ZITHROMAX Z-PAK) 250 MG tablet Completed Course    Signed,  Frederico Hamman T. Necha Harries, MD   Allergies as of 10/20/2016      Reactions   Penicillins    REACTION: Rash as a child - Tolerated AMOXICILLIN SEVERAL TIMES AS ADULT RECENTLY Has patient had a PCN reaction causing immediate rash, facial/tongue/throat swelling, SOB or lightheadedness with hypotension: YES Has patient had a PCN reaction causing severe rash involving mucus membranes or skin necrosis:NO Has patient had a PCN reaction that required  hospitalization NO Has patient had a PCN reaction occurring within the last 10 years: NO If all of the above answers are "NO", then may proceed with       Medication List       Accurate as of 10/20/16 11:59 PM. Always use your most recent med list.          AMBULATORY NON FORMULARY MEDICATION Take 90 mg by mouth 2 (two) times daily. Medication Name: BRILINTA 90 MG bid (TWILIGHT Research Study PROVIDED)   AMBULATORY NON FORMULARY MEDICATION Take 81 mg by mouth daily. Medication Name: ASPIRIN 81 mg Daily (TWILIGHT Research study PROVIDED)   atorvastatin 10 MG tablet Commonly known as:  LIPITOR Take 1 tablet (10 mg total) by mouth daily.   cyanocobalamin 500 MCG tablet Take 1 tablet (500 mcg total) by mouth daily.   folic acid 242 MCG tablet Commonly known as:  FOLVITE Take 400 mcg by mouth daily.   nitroGLYCERIN 0.4 MG SL tablet Commonly known as:  NITROSTAT Place 1 tablet (0.4 mg total) under the tongue every 5 (five) minutes as needed for chest pain. Max: 3 doses.   omeprazole 40 MG capsule Commonly known as:  PRILOSEC Take 40 mg by mouth daily.   saw palmetto 80 MG capsule Take 80 mg by mouth daily.   VITAMIN C PO Take 1 tablet by mouth daily.   ZINC PO Take 1 tablet by mouth daily.

## 2016-10-21 ENCOUNTER — Encounter: Payer: Self-pay | Admitting: Family Medicine

## 2016-10-21 DIAGNOSIS — Z955 Presence of coronary angioplasty implant and graft: Secondary | ICD-10-CM | POA: Diagnosis not present

## 2016-10-21 NOTE — Patient Instructions (Signed)
Discharge Instructions  Patient Details  Name: Phillip [name] MRN: [id number] Date of Birth: [birth year] Referring Provider:  Burnell Blanks*   Number of Visits: 36  Reason for Discharge:  Patient reached a stable level of exercise. Patient independent in their exercise.  Smoking History:  History  Smoking Status  . Never Smoker  Smokeless Tobacco  . Never Used    Diagnosis:  No diagnosis found.  Initial Exercise Prescription:     Initial Exercise Prescription - 08/02/16 1400      Date of Initial Exercise RX and Referring Provider   Date 08/02/16   Referring Provider End, Harrell Gave MD     Treadmill   MPH 2.5   Grade 1   Minutes 15   METs 3.26     Elliptical   Level 1   Speed 3.3   Minutes 15     REL-XR   Level 4   Watts --  speed > 50 spm   Minutes 15   METs 2     Prescription Details   Frequency (times per week) 3   Duration Progress to 45 minutes of aerobic exercise without signs/symptoms of physical distress     Intensity   THRR 40-80% of Max Heartrate 114-141   Ratings of Perceived Exertion 11-15   Perceived Dyspnea 0-4     Progression   Progression Continue to progress workloads to maintain intensity without signs/symptoms of physical distress.     Resistance Training   Training Prescription Yes   Weight 4 lbs   Reps 10-12      Discharge Exercise Prescription (Final Exercise Prescription Changes):     Exercise Prescription Changes - 10/21/16 1000      Response to Exercise   Blood Pressure (Admit) 130/80   Blood Pressure (Exercise) 144/82   Blood Pressure (Exit) 98/62   Heart Rate (Admit) 83 bpm   Heart Rate (Exercise) 120 bpm   Heart Rate (Exit) 80 bpm   Rating of Perceived Exertion (Exercise) 14   Symptoms none   Duration Progress to 45 minutes of aerobic exercise without signs/symptoms of physical distress   Intensity THRR unchanged     Progression   Progression Continue to progress workloads to maintain  intensity without signs/symptoms of physical distress.     Resistance Training   Training Prescription Yes   Weight 5   Reps 10-15     Interval Training   Interval Training --     Treadmill   MPH 3   Grade 2   Minutes 15   METs 4.12     Elliptical   Level 1   Speed 3.3   Minutes 15      Functional Capacity:     Henry Name 08/02/16 1433 10/14/16 1711       6 Minute Walk   Phase Initial Discharge    Distance 1468 feet 1742 feet    Distance % Change  - 18.6 %    Walk Time 6 minutes 6 minutes    # of Rest Breaks 0 0    MPH 2.78 3.29    METS 3.55 3.29    RPE 12 13    VO2 Peak 12.44 1.5    Symptoms No No    Resting HR 87 bpm 56 bpm    Resting BP 126/64 134/54    Max Ex. HR 120 bpm 104 bpm    Max Ex. BP 126/70 136/64    2 Minute  Post BP 112/60  -       Quality of Life:     Quality of Life - 10/19/16 1125      Quality of Life Scores   Health/Function Pre 26.3 %   Health/Function Post 27.9 %   Health/Function % Change 6.08 %   Socioeconomic Pre 30 %   Socioeconomic Post 30 %   Socioeconomic % Change  0 %   Psych/Spiritual Pre 27.21 %   Psych/Spiritual Post 28.93 %   Psych/Spiritual % Change 6.32 %   Family Pre 28.8 %   Family Post 30 %   Family % Change 4.17 %   GLOBAL Pre 27.62 %   GLOBAL Post 28.85 %   GLOBAL % Change 4.45 %      Personal Goals: Goals established at orientation with interventions provided to work toward goal.     Personal Goals and Risk Factors at Admission - 08/02/16 1415      Core Components/Risk Factors/Patient Goals on Admission    Weight Management Yes;Obesity;Weight Maintenance   Intervention Weight Management: Develop a combined nutrition and exercise program designed to reach desired caloric intake, while maintaining appropriate intake of nutrient and fiber, sodium and fats, and appropriate energy expenditure required for the weight goal.   Admit Weight 231 lb (104.8 kg)   Goal Weight: Short Term 228  lb (103.4 kg)   Goal Weight: Long Term 210 lb (95.3 kg)   Expected Outcomes Short Term: Continue to assess and modify interventions until short term weight is achieved;Long Term: Adherence to nutrition and physical activity/exercise program aimed toward attainment of established weight goal;Weight Loss: Understanding of general recommendations for a balanced deficit meal plan, which promotes 1-2 lb weight loss per week and includes a negative energy balance of [phone] kcal/d   Sedentary Yes   Intervention Provide advice, education, support and counseling about physical activity/exercise needs.;Develop an individualized exercise prescription for aerobic and resistive training based on initial evaluation findings, risk stratification, comorbidities and participant's personal goals.   Expected Outcomes Achievement of increased cardiorespiratory fitness and enhanced flexibility, muscular endurance and strength shown through measurements of functional capacity and personal statement of participant.   Increase Strength and Stamina Yes   Intervention Provide advice, education, support and counseling about physical activity/exercise needs.;Develop an individualized exercise prescription for aerobic and resistive training based on initial evaluation findings, risk stratification, comorbidities and participant's personal goals.   Expected Outcomes Achievement of increased cardiorespiratory fitness and enhanced flexibility, muscular endurance and strength shown through measurements of functional capacity and personal statement of participant.   Lipids Yes   Intervention Provide education and support for participant on nutrition & aerobic/resistive exercise along with prescribed medications to achieve LDL 70mg , HDL >40mg .   Expected Outcomes Short Term: Participant states understanding of desired cholesterol values and is compliant with medications prescribed. Participant is following exercise prescription and  nutrition guidelines.;Long Term: Cholesterol controlled with medications as prescribed, with individualized exercise RX and with personalized nutrition plan. Value goals: LDL < 70mg , HDL > 40 mg.   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 Teach and support self-help strategies.;Refer participants experiencing significant psychosocial distress to appropriate mental health specialists for further evaluation and treatment. When possible, include family members and significant others in education/counseling sessions.   Expected Outcomes Short Term: Participant demonstrates changes  in health-related behavior, relaxation and other stress management skills, ability to obtain effective social support, and compliance with psychotropic medications if prescribed.;Long Term: Emotional wellbeing is indicated by absence of clinically significant psychosocial distress or social isolation.       Personal Goals Discharge:     Goals and Risk Factor Review - 09/27/16 1744      Core Components/Risk Factors/Patient Goals Review   Review WEight even after having some relaxation time off is 221lbs. He said he plans on exercising with his wife at [facility]. He said he golfs and walks when he golfs.    Expected Outcomes To keep living a heart healthy life.       Nutrition & Weight - Outcomes:     Pre Biometrics - 08/02/16 1443      Pre Biometrics   Height 6' 1.8" (1.875 m)   Weight 231 lb (104.8 kg)   Waist Circumference 43 inches   Hip Circumference 43 inches   Waist to Hip Ratio 1 %   BMI (Calculated) 29.9   Single Leg Stand 7.85 seconds         Post Biometrics - 10/14/16 1710       Post  Biometrics   Height 6' 1.8" (1.875 m)   Weight 219 lb (99.3 kg)   Waist Circumference 41.5 inches    Hip Circumference 42.5 inches   Waist to Hip Ratio 0.98 %   BMI (Calculated) 28.3   Single Leg Stand 30 seconds      Nutrition:     Nutrition Therapy & Goals - 09/14/16 1139      Nutrition Therapy   Diet Instructed on a meal plan based on 2000 calories including heart healthy dietary guidelines   Drug/Food Interactions Statins/Certain Fruits   Protein (specify units) 8   Fiber 20 grams   Whole Grain Foods 3 servings   Saturated Fats 14 max. grams   Fruits and Vegetables 5 servings/day   Sodium 2000 grams  1500 mg -ideal     Personal Nutrition Goals   Nutrition Goal Read labels for saturated fat, trans fat and sodium.   Personal Goal #2 Increase water intake with goal of at least 32 oz. per day for goal of 6-8 cups of fluids daily.   Personal Goal #3 Increase fruit and vegetable intake with eventual goal of 5 servings daily.   Personal Goal #4 Check label on Plexus supplement.   Comments Patient had made many positive diet changes including lowering sodium intake and decreasing foods high in saturated fat.     Intervention Plan   Intervention Prescribe, educate and counsel regarding individualized specific dietary modifications aiming towards targeted core components such as weight, hypertension, lipid management, diabetes, heart failure and other comorbidities.   Expected Outcomes Short Term Goal: Understand basic principles of dietary content, such as calories, fat, sodium, cholesterol and nutrients.;Short Term Goal: A plan has been developed with personal nutrition goals set during dietitian appointment.;Long Term Goal: Adherence to prescribed nutrition plan.      Nutrition Discharge:     Nutrition Assessments - 10/19/16 1124      Rate Your Plate Scores   Pre Score 60   Pre Score % 66.7 %   Post Score 72   Post Score % 80 %   % Change 13.3 %      Education Questionnaire Score:     Knowledge Questionnaire Score - 10/19/16 1125      Knowledge Questionnaire Score    Post  Score 28/28      Goals reviewed with patient; copy given to patient.

## 2016-10-21 NOTE — Progress Notes (Signed)
Daily Session Note  Patient Details  Name: Phillip [name] MRN: [id number] Date of Birth: 1951/04/03 Referring Provider:   Flowsheet Row Cardiac Rehab from 08/02/2016 in [facility] Cardiac and Pulmonary Rehab  Referring Provider  End, Harrell Gave MD      Encounter Date: 10/21/2016  Check In:     Session Check In - 10/21/16 1634      Check-In   Location [facility]-Cardiac & Pulmonary Rehab   Staff Present Levell July RN BSN;Kevontay Burks, DPT, Burlene Arnt, BA, ACSM CEP, Exercise Physiologist   Supervising physician immediately available to respond to emergencies See telemetry face sheet for immediately available ER MD   Medication changes reported     No   Fall or balance concerns reported    No   Tobacco Cessation No Change   Warm-up and Cool-down Performed on first and last piece of equipment   Resistance Training Performed Yes   VAD Patient? No     Pain Assessment   Currently in Pain? No/denies   Multiple Pain Sites No           Exercise Prescription Changes - 10/21/16 1000      Response to Exercise   Blood Pressure (Admit) 130/80   Blood Pressure (Exercise) 144/82   Blood Pressure (Exit) 98/62   Heart Rate (Admit) 83 bpm   Heart Rate (Exercise) 120 bpm   Heart Rate (Exit) 80 bpm   Rating of Perceived Exertion (Exercise) 14   Symptoms none   Duration Progress to 45 minutes of aerobic exercise without signs/symptoms of physical distress   Intensity THRR unchanged     Progression   Progression Continue to progress workloads to maintain intensity without signs/symptoms of physical distress.     Resistance Training   Training Prescription Yes   Weight 5   Reps 10-15     Interval Training   Interval Training --     Treadmill   MPH 3   Grade 2   Minutes 15   METs 4.12     Elliptical   Level 1   Speed 3.3   Minutes 15      History  Smoking Status  . Never Smoker  Smokeless Tobacco  . Never Used    Goals Met:  Independence with exercise  equipment  Goals Unmet:  Not Applicable  Comments:  Nakia graduated today from cardiac rehab with 36 sessions completed.  Details of the patient's exercise prescription and what he needs to do in order to continue the prescription and progress were discussed with patient.  Patient was given a copy of prescription and goals.  Patient verbalized understanding.  Emrah plans to continue to exercise by going to [employer] or [employer].   Dr. Emily Filbert is Medical Director for [facility] and [facility].

## 2016-10-21 NOTE — Progress Notes (Signed)
Discharge Summary  Patient Details  Name: Phillip [name] MRN: [id number] Date of Birth: May 22, 1951 Referring Provider:   Flowsheet Row Cardiac Rehab from 08/02/2016 in [facility] Cardiac and Pulmonary Rehab  Referring Provider  End, Harrell Gave MD       Number of Visits: 36  Reason for Discharge:  Patient reached a stable level of exercise. Patient independent in their exercise.  Smoking History:  History  Smoking Status  . Never Smoker  Smokeless Tobacco  . Never Used    Diagnosis:  No diagnosis found.  ADL UCSD:   Initial Exercise Prescription:     Initial Exercise Prescription - 08/02/16 1400      Date of Initial Exercise RX and Referring Provider   Date 08/02/16   Referring Provider End, Harrell Gave MD     Treadmill   MPH 2.5   Grade 1   Minutes 15   METs 3.26     Elliptical   Level 1   Speed 3.3   Minutes 15     REL-XR   Level 4   Watts --  speed > 50 spm   Minutes 15   METs 2     Prescription Details   Frequency (times per week) 3   Duration Progress to 45 minutes of aerobic exercise without signs/symptoms of physical distress     Intensity   THRR 40-80% of Max Heartrate 114-141   Ratings of Perceived Exertion 11-15   Perceived Dyspnea 0-4     Progression   Progression Continue to progress workloads to maintain intensity without signs/symptoms of physical distress.     Resistance Training   Training Prescription Yes   Weight 4 lbs   Reps 10-12      Discharge Exercise Prescription (Final Exercise Prescription Changes):     Exercise Prescription Changes - 10/21/16 1000      Response to Exercise   Blood Pressure (Admit) 130/80   Blood Pressure (Exercise) 144/82   Blood Pressure (Exit) 98/62   Heart Rate (Admit) 83 bpm   Heart Rate (Exercise) 120 bpm   Heart Rate (Exit) 80 bpm   Rating of Perceived Exertion (Exercise) 14   Symptoms none   Duration Progress to 45 minutes of aerobic exercise without signs/symptoms of physical  distress   Intensity THRR unchanged     Progression   Progression Continue to progress workloads to maintain intensity without signs/symptoms of physical distress.     Resistance Training   Training Prescription Yes   Weight 5   Reps 10-15     Interval Training   Interval Training --     Treadmill   MPH 3   Grade 2   Minutes 15   METs 4.12     Elliptical   Level 1   Speed 3.3   Minutes 15      Functional Capacity:     [address] Name 08/02/16 1433 10/14/16 1711       6 Minute Walk   Phase Initial Discharge    Distance 1468 feet 1742 feet    Distance % Change  - 18.6 %    Walk Time 6 minutes 6 minutes    # of Rest Breaks 0 0    MPH 2.78 3.29    METS 3.55 3.29    RPE 12 13    VO2 Peak 12.44 1.5    Symptoms No No    Resting HR 87 bpm 56 bpm    Resting BP  126/64 134/54    Max Ex. HR 120 bpm 104 bpm    Max Ex. BP 126/70 136/64    2 Minute Post BP 112/60  -       Psychological, QOL, Others - Outcomes: PHQ 2/9: Depression screen [facility] 2/9 10/19/2016 08/02/2016  Decreased Interest - 0  Down, Depressed, Hopeless - 0  PHQ - 2 Score - 0  Altered sleeping 0 0  Tired, decreased energy 1 1  Change in appetite 0 0  Feeling bad or failure about yourself  0 0  Trouble concentrating 0 0  Moving slowly or fidgety/restless 0 0  Suicidal thoughts 0 0  PHQ-9 Score - 1  Difficult doing work/chores Not difficult at all Not difficult at all    Quality of Life:     Quality of Life - 10/19/16 1125      Quality of Life Scores   Health/Function Pre 26.3 %   Health/Function Post 27.9 %   Health/Function % Change 6.08 %   Socioeconomic Pre 30 %   Socioeconomic Post 30 %   Socioeconomic % Change  0 %   Psych/Spiritual Pre 27.21 %   Psych/Spiritual Post 28.93 %   Psych/Spiritual % Change 6.32 %   Family Pre 28.8 %   Family Post 30 %   Family % Change 4.17 %   GLOBAL Pre 27.62 %   GLOBAL Post 28.85 %   GLOBAL % Change 4.45 %      Personal  Goals: Goals established at orientation with interventions provided to work toward goal.     Personal Goals and Risk Factors at Admission - 08/02/16 1415      Core Components/Risk Factors/Patient Goals on Admission    Weight Management Yes;Obesity;Weight Maintenance   Intervention Weight Management: Develop a combined nutrition and exercise program designed to reach desired caloric intake, while maintaining appropriate intake of nutrient and fiber, sodium and fats, and appropriate energy expenditure required for the weight goal.   Admit Weight 231 lb (104.8 kg)   Goal Weight: Short Term 228 lb (103.4 kg)   Goal Weight: Long Term 210 lb (95.3 kg)   Expected Outcomes Short Term: Continue to assess and modify interventions until short term weight is achieved;Long Term: Adherence to nutrition and physical activity/exercise program aimed toward attainment of established weight goal;Weight Loss: Understanding of general recommendations for a balanced deficit meal plan, which promotes 1-2 lb weight loss per week and includes a negative energy balance of [phone] kcal/d   Sedentary Yes   Intervention Provide advice, education, support and counseling about physical activity/exercise needs.;Develop an individualized exercise prescription for aerobic and resistive training based on initial evaluation findings, risk stratification, comorbidities and participant's personal goals.   Expected Outcomes Achievement of increased cardiorespiratory fitness and enhanced flexibility, muscular endurance and strength shown through measurements of functional capacity and personal statement of participant.   Increase Strength and Stamina Yes   Intervention Provide advice, education, support and counseling about physical activity/exercise needs.;Develop an individualized exercise prescription for aerobic and resistive training based on initial evaluation findings, risk stratification, comorbidities and participant's personal  goals.   Expected Outcomes Achievement of increased cardiorespiratory fitness and enhanced flexibility, muscular endurance and strength shown through measurements of functional capacity and personal statement of participant.   Lipids Yes   Intervention Provide education and support for participant on nutrition & aerobic/resistive exercise along with prescribed medications to achieve LDL 70mg , HDL >40mg .   Expected Outcomes Short Term: Participant states understanding of desired  cholesterol values and is compliant with medications prescribed. Participant is following exercise prescription and nutrition guidelines.;Long Term: Cholesterol controlled with medications as prescribed, with individualized exercise RX and with personalized nutrition plan. Value goals: LDL < 70mg , HDL > 40 mg.   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 Teach and support self-help strategies.;Refer participants experiencing significant psychosocial distress to appropriate mental health specialists for further evaluation and treatment. When possible, include family members and significant others in education/counseling sessions.   Expected Outcomes Short Term: Participant demonstrates changes in health-related behavior, relaxation and other stress management skills, ability to obtain effective social support, and compliance with psychotropic medications if prescribed.;Long Term: Emotional wellbeing is indicated by absence of clinically significant psychosocial distress or social isolation.       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 He said he has deacons that help him but it is a stressful job since he has a lot of sick members. Phillip [name] hopes to get his stamina back.  WEight even after having some relaxation time off is 221lbs. He said he plans on exercising with his wife at [facility]. He said he golfs and walks when he golfs.       Expected Outcomes Heart healthier life. To keep living a heart healthy life.          Nutrition & Weight - Outcomes:     Pre Biometrics - 08/02/16 1443      Pre Biometrics   Height 6' 1.8" (1.875 m)   Weight 231 lb (104.8 kg)   Waist Circumference 43 inches   Hip Circumference 43 inches   Waist to Hip Ratio 1 %   BMI (Calculated) 29.9   Single Leg Stand 7.85 seconds         Post Biometrics - 10/14/16 1710       Post  Biometrics   Height 6' 1.8" (1.875 m)   Weight 219 lb (99.3 kg)   Waist Circumference 41.5 inches   Hip Circumference 42.5 inches   Waist to Hip Ratio 0.98 %   BMI (Calculated) 28.3   Single Leg Stand 30 seconds      Nutrition:     Nutrition Therapy & Goals - 09/14/16 1139      Nutrition Therapy   Diet Instructed on a meal plan based on 2000 calories including heart healthy dietary guidelines   Drug/Food Interactions Statins/Certain Fruits   Protein (specify units) 8   Fiber 20 grams   Whole Grain Foods 3 servings   Saturated Fats 14 max. grams   Fruits and Vegetables 5 servings/day   Sodium 2000 grams  1500 mg -ideal     Personal Nutrition Goals   Nutrition Goal Read labels for saturated fat, trans fat and sodium.   Personal Goal #2 Increase water intake with goal of at least 32 oz. per day for goal of 6-8 cups of fluids daily.   Personal Goal #3 Increase fruit and vegetable intake with eventual goal of 5  servings daily.   Personal Goal #4 Check label on Plexus supplement.   Comments Patient had made many positive diet changes including lowering  sodium intake and decreasing foods high in saturated fat.     Intervention Plan   Intervention Prescribe, educate and counsel regarding individualized specific dietary modifications aiming towards targeted core components such as weight, hypertension, lipid management, diabetes, heart failure and other comorbidities.   Expected Outcomes Short Term Goal: Understand basic principles of dietary content, such as calories, fat, sodium, cholesterol and nutrients.;Short Term Goal: A plan has been developed with personal nutrition goals set during dietitian appointment.;Long Term Goal: Adherence to prescribed nutrition plan.      Nutrition Discharge:     Nutrition Assessments - 10/19/16 1124      Rate Your Plate Scores   Pre Score 60   Pre Score % 66.7 %   Post Score 72   Post Score % 80 %   % Change 13.3 %      Education Questionnaire Score:     Knowledge Questionnaire Score - 10/19/16 1125      Knowledge Questionnaire Score   Post Score 28/28      Goals reviewed with patient; copy given to patient.

## 2016-10-26 ENCOUNTER — Ambulatory Visit: Payer: Medicare Other | Admitting: Internal Medicine

## 2016-10-27 ENCOUNTER — Ambulatory Visit: Payer: Medicare Other | Admitting: Internal Medicine

## 2016-11-02 ENCOUNTER — Ambulatory Visit (INDEPENDENT_AMBULATORY_CARE_PROVIDER_SITE_OTHER): Payer: Medicare Other | Admitting: Internal Medicine

## 2016-11-02 ENCOUNTER — Encounter: Payer: Self-pay | Admitting: Internal Medicine

## 2016-11-02 VITALS — BP 128/74 | HR 63 | Ht 73.0 in | Wt 218.8 lb

## 2016-11-02 DIAGNOSIS — E785 Hyperlipidemia, unspecified: Secondary | ICD-10-CM

## 2016-11-02 DIAGNOSIS — I2510 Atherosclerotic heart disease of native coronary artery without angina pectoris: Secondary | ICD-10-CM | POA: Insufficient documentation

## 2016-11-02 MED ORDER — ATORVASTATIN CALCIUM 10 MG PO TABS: 10.0000 mg | ORAL_TABLET | Freq: Every day | ORAL | 3 refills | Status: DC

## 2016-11-02 NOTE — Progress Notes (Signed)
Office Visit    Patient Name: Phillip [name] Date of Encounter: 11/02/2016  Primary Care Provider:  Owens Loffler, MD Primary Cardiologist:  Phillip Bush, MD   Chief Complaint    66 year old male status post recent auscultation front stable angina requiring stenting of the LAD, who presents for follow-up.  Past Medical History    Past Medical History:  Diagnosis Date  . Allergic rhinitis   . Arthritis   . CAD (coronary artery disease)    a. 06/2016 Echo: EF 55-60%, no rwma, mild AI, triv MR/TR/PR, lipomatous hypertrophy of atrial septum;  b. 06/2016 Ex MV: EF 56%, large, partially reversible anteroseptal, inferoseptal, inferior,and apical perfusion defect - borderline for ischemia (TID 1.64) - equivocal study;  c. 06/2016 PCI: LM nl, LAD 25p/62m (3.0x24 Promus Premier MR DES), LCX min irregs, RCA nl.  . Colon polyps    a. Severe dysplasia in a right-sided polyp removed surgically in [place], 2006--> final pathology showed this was not a cancer.  Follow up colonoscopy at one year interval was normal.  . Erectile dysfunction   . GERD (gastroesophageal reflux disease)    H/o Barrett's esophagus, orginally diagnosed in 2006, biopsies showed no dysplasia; follow up EGD also showed no dysplasia  . History of BPH   . Lyme disease    x2   Past Surgical History:  Procedure Laterality Date  . CARDIAC CATHETERIZATION N/A 07/14/2016   Procedure: Left Heart Cath and Coronary Angiography;  Surgeon: Phillip Mocha, MD;  Location: [facility];  Service: Cardiovascular;  Laterality: N/A;  . CARDIAC CATHETERIZATION N/A 07/14/2016   Procedure: Coronary Stent Intervention;  Surgeon: Phillip Mocha, MD;  Location: [facility];  Service: Cardiovascular;  Laterality: N/A;  . COLONOSCOPY  12/2008   Repeat 5 years  . ESOPHAGOGASTRODUODENOSCOPY  12/2008   Barrett's, repeat 3 years  . HEMICOLECTOMY  2006   Right, for severely dysplastic right colon polyp  . HERNIA REPAIR   2426   UMBILICAL  . LUMBAR DISC SURGERY  1992   L4-5  . TONSILLECTOMY     As a child  . TUMOR REMOVAL  2002   Fatty tumor (neck)  . UPPER GASTROINTESTINAL ENDOSCOPY    . VARICOSE VEIN SURGERY  1997   Removal, right    Allergies  Allergies  Allergen Reactions  . Penicillins     REACTION: Rash as a child - Tolerated AMOXICILLIN SEVERAL TIMES AS ADULT RECENTLY Has patient had a PCN reaction causing immediate rash, facial/tongue/throat swelling, SOB or lightheadedness with hypotension: YES Has patient had a PCN reaction causing severe rash involving mucus membranes or skin necrosis:NO Has patient had a PCN reaction that required hospitalization NO Has patient had a PCN reaction occurring within the last 10 years: NO If all of the above answers are "NO", then may proceed with     History of Present Illness    66 year old male with the above past medical history. In 2011, he had some episodic chest wall pain and was seen by cardiology but was not felt to require testing at that time. He experienced progressive exertional chest pain and shortness of breath last fall, with subsequent stress test demonstrating anterior ischemia. Left heart catheterization showed high-grade proximal LAD stenosis, which was successfully treated with a single DES. He was enrolled in the Twilight study and continues on ticagrelor +/- ASA. He was seen by Phillip Bayley, PA in 07/2016 for f/u, at which time Phillip [name] was doing well. Today, he  continues to feel well. He notes occasional pain across the upper chest and back, which he attributes to musculoskeletal issues. He has experienced this pain for several years; it is decidedly different than what he experienced prior to PCI. The pain is typically present when sitting at his desk or when stressed. He is able to exercise with improvement in the pain. He sometimes feels as though it is difficult to take a deep breath. He otherwise denies shortness of breath, leg  swelling, orthopnea, PND, palpitations, and lightheadedness. He is currently on atorvastatin 10 mg daily, which he is tolerating well.  Home Medications    Prior to Admission medications   Medication Sig Start Date Phillip [name] Date Taking? Authorizing Provider  albuterol (PROVENTIL HFA;VENTOLIN HFA) 108 (90 Base) MCG/ACT inhaler Inhale 2 puffs into the lungs every 6 (six) hours as needed for wheezing or shortness of breath. 10/11/15  Yes Phillip Lukes, MD  AMBULATORY NON FORMULARY MEDICATION Take 90 mg by mouth 2 (two) times daily. Medication Name: BRILINTA 90 MG bid (TWILIGHT Research Study PROVIDED) 07/15/16  Yes Phillip Blanks, MD  AMBULATORY NON FORMULARY MEDICATION Take 81 mg by mouth daily. Medication Name: ASPIRIN 81 mg Daily (TWILIGHT Research study PROVIDED) 07/15/16  Yes Phillip Blanks, MD  Ascorbic Acid (VITAMIN C PO) Take 1 tablet by mouth daily.   Yes Historical Provider, MD  atorvastatin (LIPITOR) 20 MG tablet Take 1 tablet (20 mg total) by mouth daily at 6 PM. 07/15/16  Yes Phillip Jansky, MD  folic acid (FOLVITE) 638 MCG tablet Take 400 mcg by mouth daily.   Yes Historical Provider, MD  Multiple Vitamins-Minerals (ZINC PO) Take 1 tablet by mouth daily.   Yes Historical Provider, MD  nitroGLYCERIN (NITROSTAT) 0.4 MG SL tablet Place 1 tablet (0.4 mg total) under the tongue every 5 (five) minutes as needed for chest pain. Max: 3 doses. 07/15/16  Yes Phillip Jansky, MD  omeprazole (PRILOSEC) 40 MG capsule Take 40 mg by mouth daily.    Yes Historical Provider, MD  saw palmetto 80 MG capsule Take 80 mg by mouth daily.    Yes Historical Provider, MD  vitamin B-12 500 MCG tablet Take 1 tablet (500 mcg total) by mouth daily. 07/15/16  Yes Phillip Jansky, MD    Review of Systems    He denies chest pain, palpitations, dyspnea, pnd, orthopnea, n, v, dizziness, syncope, edema, weight gain, or early satiety.  All other systems reviewed and are otherwise negative except as noted  above.  Physical Exam    VS:  BP 128/74 (BP Location: Left Arm, Patient Position: Sitting, Cuff Size: Normal)   Pulse 63   Ht 6\' 1"  (1.854 m)   Wt 218 lb 12 oz (99.2 kg)   BMI 28.86 kg/m  , BMI Body mass index is 28.86 kg/m. GEN: Well nourished, well developed, in no acute distress.  HEENT: OP clear with MMM. No conjunctival pallor or scleral icterus. Neck: Supple, no JVD, carotid bruits, or masses. Cardiac: RRR, no murmurs, rubs, or gallops. No clubbing, cyanosis, edema.  Radials/DP/PT 2+ and equal bilaterally. Respiratory:  Respirations regular and unlabored, clear to auscultation bilaterally. GI: Soft, nontender, nondistended, BS + x 4. MS: no deformity or atrophy. Skin: warm and dry, no rash. Neuro:  Strength and sensation are intact. Psych: Normal affect.  Accessory Clinical Findings    ECG - NSR without abnormalities.  Assessment & Plan    1.  Coronary artery disease without angina Patient doing well  following PCI to LAD stenosis in 06/2016. Chronic non-exertional chest pain has been stable for years and is unlikely to be related to CAD. We will continue his current medication, including ticagrelor +/- ASA (part of Twilight trial). Patient completed cardiac rehab earlier this month and has joined a gym in order to continue exercising. If his atypical chest pain worsens or other symptoms concerning for coronary insufficiency develop, we will need to consider repeat ischemia evaluation.  2. Dyslipidemia: He is new to statin therapy. LDL was 27 on 10/11/16. We will continue atorvastatin 10 mg daily.  Phillip Bush, MD 11/02/2016, 7:29 PM

## 2016-11-02 NOTE — Patient Instructions (Signed)
Medication Instructions:  Your physician recommends that you continue on your current medications as directed. Please refer to the Current Medication list given to you today.   Labwork: none  Testing/Procedures: none  Follow-Up: Your physician recommends that you schedule a follow-up appointment in: 3 MONTHS WITH DR END.   If you need a refill on your cardiac medications before your next appointment, please call your pharmacy.   

## 2016-11-10 ENCOUNTER — Encounter: Payer: Self-pay | Admitting: *Deleted

## 2016-11-10 DIAGNOSIS — Z955 Presence of coronary angioplasty implant and graft: Secondary | ICD-10-CM

## 2016-11-10 NOTE — Progress Notes (Signed)
Cardiac Individual Treatment Plan  Patient Details  Name: Phillip [name] MRN: [id number] Date of Birth: 1950/12/13 Referring Provider:     Cardiac Rehab from 08/02/2016 in [facility] Cardiac and Pulmonary Rehab  Referring Provider  End, Harrell Gave MD      Initial Encounter Date:    Cardiac Rehab from 08/02/2016 in [facility] Cardiac and Pulmonary Rehab  Date  08/02/16  Referring Provider  End, Harrell Gave MD      Visit Diagnosis: Status post coronary artery stent placement  Patient's Home Medications on Admission:  Current Outpatient Prescriptions:  .  AMBULATORY NON FORMULARY MEDICATION, Take 90 mg by mouth 2 (two) times daily. Medication Name: BRILINTA 90 MG bid (TWILIGHT Research Study PROVIDED), Disp: , Rfl:  .  AMBULATORY NON FORMULARY MEDICATION, Take 81 mg by mouth daily. Medication Name: ASPIRIN 81 mg Daily (TWILIGHT Research study PROVIDED), Disp: , Rfl:  .  Ascorbic Acid (VITAMIN C PO), Take 1 tablet by mouth daily., Disp: , Rfl:  .  atorvastatin (LIPITOR) 10 MG tablet, Take 1 tablet (10 mg total) by mouth daily., Disp: 90 tablet, Rfl: 3 .  folic acid (FOLVITE) 950 MCG tablet, Take 400 mcg by mouth daily., Disp: , Rfl:  .  Multiple Vitamins-Minerals (ZINC PO), Take 1 tablet by mouth daily., Disp: , Rfl:  .  nitroGLYCERIN (NITROSTAT) 0.4 MG SL tablet, Place 1 tablet (0.4 mg total) under the tongue every 5 (five) minutes as needed for chest pain. Max: 3 doses., Disp: 30 tablet, Rfl: 0 .  omeprazole (PRILOSEC) 40 MG capsule, Take 40 mg by mouth daily. , Disp: , Rfl:  .  saw palmetto 80 MG capsule, Take 80 mg by mouth daily. , Disp: , Rfl:  .  vitamin B-12 500 MCG tablet, Take 1 tablet (500 mcg total) by mouth daily., Disp: 30 tablet, Rfl: 0  Past Medical History: Past Medical History:  Diagnosis Date  . Allergic rhinitis   . Arthritis   . CAD (coronary artery disease)    a. 06/2016 Echo: EF 55-60%, no rwma, mild AI, triv MR/TR/PR, lipomatous hypertrophy of atrial septum;  b.  06/2016 Ex MV: EF 56%, large, partially reversible anteroseptal, inferoseptal, inferior,and apical perfusion defect - borderline for ischemia (TID 1.64) - equivocal study;  c. 06/2016 PCI: LM nl, LAD 25p/41m(3.0x24 Promus Premier MR DES), LCX min irregs, RCA nl.  . Colon polyps    a. Severe dysplasia in a right-sided polyp [place] surgically in [place] 2006--> final pathology showed this was not a cancer.  Follow up colonoscopy at one year interval was normal.  . Erectile dysfunction   . GERD (gastroesophageal reflux disease)    H/o Barrett's esophagus, orginally diagnosed in 2006, biopsies showed no dysplasia; follow up EGD also showed no dysplasia  . History of BPH   . Lyme disease    x2    Tobacco Use: History  Smoking Status  . Never Smoker  Smokeless Tobacco  . Never Used    Labs: Recent Review Flowsheet Data    Labs for ITP Cardiac and Pulmonary Rehab Latest Ref Rng & Units 02/05/2013 07/21/2015 07/14/2016 08/25/2016 10/11/2016   Cholestrol 0 - 200 mg/dL 128 105 102 67(L) 68   LDLCALC 0 - 99 mg/dL 71 56 55 22 27   HDL >40 mg/dL 24.00(L) 25.10(L) 18(L) 26(L) 24(L)   Trlycerides <150 mg/dL 165.0(H) 121.0 147 95 86   Hemoglobin A1c 4.8 - 5.6 % - - 5.6 - -       Exercise Target Goals:  Exercise Program Goal: Individual exercise prescription set with THRR, safety & activity barriers. Participant demonstrates ability to understand and report RPE using BORG scale, to self-measure pulse accurately, and to acknowledge the importance of the exercise prescription.  Exercise Prescription Goal: Starting with aerobic activity 30 plus minutes a day, 3 days per week for initial exercise prescription. Provide home exercise prescription and guidelines that participant acknowledges understanding prior to discharge.  Activity Barriers & Risk Stratification:     Activity Barriers & Cardiac Risk Stratification - 08/02/16 1421      Activity Barriers & Cardiac Risk Stratification    Activity Barriers Arthritis;Back Problems;Joint Problems  mild arthritis hands, history torn cartiledge knee, back surgery 1992, some disc issues. Does not lift over 50 lb   Cardiac Risk Stratification High      6 Minute Walk:     6 Minute Walk    Row Name 08/02/16 1433 10/14/16 1711       6 Minute Walk   Phase Initial Discharge    Distance 1468 feet 1742 feet    Distance % Change  - 18.6 %    Walk Time 6 minutes 6 minutes    # of Rest Breaks 0 0    MPH 2.78 3.29    METS 3.55 3.29    RPE 12 13    VO2 Peak 12.44 1.5    Symptoms No No    Resting HR 87 bpm 56 bpm    Resting BP 126/64 134/54    Max Ex. HR 120 bpm 104 bpm    Max Ex. BP 126/70 136/64    2 Minute Post BP 112/60  -       Oxygen Initial Assessment:   Oxygen Re-Evaluation:   Oxygen Discharge (Final Oxygen Re-Evaluation):   Initial Exercise Prescription:     Initial Exercise Prescription - 08/02/16 1400      Date of Initial Exercise RX and Referring Provider   Date 08/02/16   Referring Provider End, Harrell Gave MD     Treadmill   MPH 2.5   Grade 1   Minutes 15   METs 3.26     Elliptical   Level 1   Speed 3.3   Minutes 15     REL-XR   Level 4   Watts --  speed > 50 spm   Minutes 15   METs 2     Prescription Details   Frequency (times per week) 3   Duration Progress to 45 minutes of aerobic exercise without signs/symptoms of physical distress     Intensity   THRR 40-80% of Max Heartrate 114-141   Ratings of Perceived Exertion 11-15   Perceived Dyspnea 0-4     Progression   Progression Continue to progress workloads to maintain intensity without signs/symptoms of physical distress.     Resistance Training   Training Prescription Yes   Weight 4 lbs   Reps 10-12      Perform Capillary Blood Glucose checks as needed.  Exercise Prescription Changes:     Exercise Prescription Changes    Row Name 08/02/16 1400 08/13/16 0900 08/26/16 1200 09/09/16 1100 09/15/16 1700      Response to Exercise   Blood Pressure (Admit) 126/64 140/80 114/78 108/74  -   Blood Pressure (Exercise) 126/70 148/68 128/80 136/78  -   Blood Pressure (Exit) 112/60 112/72 106/68 128/82  -   Heart Rate (Admit) 87 bpm 75 bpm 76 bpm 69 bpm  -   Heart Rate (Exercise) 120  bpm 127 bpm 125 bpm 130 bpm  -   Heart Rate (Exit) 64 bpm 101 bpm 88 bpm 70 bpm  -   Oxygen Saturation (Admit) 100 %  -  -  -  -   Oxygen Saturation (Exercise) 100 %  -  -  -  -   Rating of Perceived Exertion (Exercise) 12 14 13 15   -   Symptoms none none none none  -   Duration  - Progress to 45 minutes of aerobic exercise without signs/symptoms of physical distress Progress to 45 minutes of aerobic exercise without signs/symptoms of physical distress Progress to 45 minutes of aerobic exercise without signs/symptoms of physical distress  -   Intensity  - THRR unchanged THRR unchanged THRR unchanged  -     Progression   Progression  - Continue to progress workloads to maintain intensity without signs/symptoms of physical distress. Continue to progress workloads to maintain intensity without signs/symptoms of physical distress. Continue to progress workloads to maintain intensity without signs/symptoms of physical distress.  -   Average METs  - 4.58 4.3  -  -     Resistance Training   Training Prescription  - Yes Yes Yes  -   Weight  - 4 lbs 4 5  -   Reps  - 10-12 10-12 10-12  -     Interval Training   Interval Training  - No No No  -     Treadmill   MPH  - 2.5  -  -  -   Grade  - 1  -  -  -   Minutes  - 15  -  -  -   METs  - 3.26  -  -  -     Elliptical   Level  - 1 1 1   -   Speed  - 3.3 3.3 3.3  -   Minutes  - 15 15 15   -     REL-XR   Level  - 4 4 6   -   Minutes  - 15 15 15   -   METs  - 5.9 4.3  -  -     Home Exercise Plan   Plans to continue exercise at  -  -  -  - [employer] (comment)   Frequency  -  -  -  - Add 2 additional days to program exercise sessions.     Exercise Review   Progression  -  walk test results Yes  -  -  -   Row Name 10/07/16 1100 10/21/16 1000           Response to Exercise   Blood Pressure (Admit) 114/62 130/80      Blood Pressure (Exercise)  - 144/82      Blood Pressure (Exit)  - 98/62      Heart Rate (Admit) 78 bpm 83 bpm      Heart Rate (Exercise) 121 bpm 120 bpm      Heart Rate (Exit) 77 bpm 80 bpm      Rating of Perceived Exertion (Exercise) 13 14      Symptoms none none      Duration Progress to 45 minutes of aerobic exercise without signs/symptoms of physical distress Progress to 45 minutes of aerobic exercise without signs/symptoms of physical distress      Intensity THRR unchanged THRR unchanged        Progression   Progression Continue to progress workloads to maintain  intensity without signs/symptoms of physical distress. Continue to progress workloads to maintain intensity without signs/symptoms of physical distress.      Average METs 6.3  -        Resistance Training   Training Prescription Yes Yes      Weight 5 5      Reps 10-12 10-15        Interval Training   Interval Training  - -        Treadmill   MPH  - 3      Grade  - 2      Minutes  - 15      METs  - 4.12        Elliptical   Level 1 1      Speed  - 3.3      Minutes 15 15        REL-XR   Level 6  -      Minutes 15  -      METs 6.3  -         Exercise Comments:     Exercise Comments    Row Name 08/02/16 1440 08/04/16 1746 08/13/16 0954 08/26/16 1238 09/09/16 1110   Exercise Comments Elta Guadeloupe wants to increase his stamina and energy.  He also wants to lose weight with a goal of 210 lbs.  First full day of exercise!  Patient was oriented to gym and equipment including functions, settings, policies, and procedures.  Patient's individual exercise prescription and treatment plan were reviewed.  All starting workloads were established based on the results of the 6 minute walk test done at initial orientation visit.  The plan for exercise progression was also introduced  and progression will be customized based on patient's performance and goals. Elta Guadeloupe is off to a good start with rehab.  He is doing well and already making some progress.  We will continue to track his progression. Elta Guadeloupe is progressing well with exercise. Elta Guadeloupe is progressing well and has increased his intensity on the XR.   [place] Name 09/15/16 1710 09/23/16 1111 10/07/16 1142       Exercise Comments Elta Guadeloupe may join  [employer] or another facility to exercise after [employer]. Elta Guadeloupe has been out of town since last update. Elta Guadeloupe consistently works in his THR range and has improved MET levels.        Exercise Goals and Review:   Exercise Goals Re-Evaluation :   Discharge Exercise Prescription (Final Exercise Prescription Changes):     Exercise Prescription Changes - 10/21/16 1000      Response to Exercise   Blood Pressure (Admit) 130/80   Blood Pressure (Exercise) 144/82   Blood Pressure (Exit) 98/62   Heart Rate (Admit) 83 bpm   Heart Rate (Exercise) 120 bpm   Heart Rate (Exit) 80 bpm   Rating of Perceived Exertion (Exercise) 14   Symptoms none   Duration Progress to 45 minutes of aerobic exercise without signs/symptoms of physical distress   Intensity THRR unchanged     Progression   Progression Continue to progress workloads to maintain intensity without signs/symptoms of physical distress.     Resistance Training   Training Prescription Yes   Weight 5   Reps 10-15     Interval Training   Interval Training --     Treadmill   MPH 3   Grade 2   Minutes 15   METs 4.12     Elliptical   Level 1  Speed 3.3   Minutes 15      Nutrition:  Target Goals: Understanding of nutrition guidelines, daily intake of sodium <1586m, cholesterol <2076m calories 30% from fat and 7% or less from saturated fats, daily to have 5 or more servings of fruits and vegetables.  Biometrics:     Pre Biometrics - 08/02/16 1443      Pre Biometrics   Height 6' 1.8" (1.875 m)   Weight  231 lb (104.8 kg)   Waist Circumference 43 inches   Hip Circumference 43 inches   Waist to Hip Ratio 1 %   BMI (Calculated) 29.9   Single Leg Stand 7.85 seconds         Post Biometrics - 10/14/16 1710       Post  Biometrics   Height 6' 1.8" (1.875 m)   Weight 219 lb (99.3 kg)   Waist Circumference 41.5 inches   Hip Circumference 42.5 inches   Waist to Hip Ratio 0.98 %   BMI (Calculated) 28.3   Single Leg Stand 30 seconds      Nutrition Therapy Plan and Nutrition Goals:     Nutrition Therapy & Goals - 09/14/16 1139      Nutrition Therapy   Diet Instructed on a meal plan based on 2000 calories including heart healthy dietary guidelines   Drug/Food Interactions Statins/Certain Fruits   Protein (specify units) 8   Fiber 20 grams   Whole Grain Foods 3 servings   Saturated Fats 14 max. grams   Fruits and Vegetables 5 servings/day   Sodium 2000 grams  1500 mg -ideal     Personal Nutrition Goals   Nutrition Goal Read labels for saturated fat, trans fat and sodium.   Personal Goal #2 Increase water intake with goal of at least 32 oz. per day for goal of 6-8 cups of fluids daily.   Personal Goal #3 Increase fruit and vegetable intake with eventual goal of 5 servings daily.   Personal Goal #4 Check label on Plexus supplement.   Comments Patient had made many positive diet changes including lowering sodium intake and decreasing foods high in saturated fat.     Intervention Plan   Intervention Prescribe, educate and counsel regarding individualized specific dietary modifications aiming towards targeted core components such as weight, hypertension, lipid management, diabetes, heart failure and other comorbidities.   Expected Outcomes Short Term Goal: Understand basic principles of dietary content, such as calories, fat, sodium, cholesterol and nutrients.;Short Term Goal: A plan has been developed with personal nutrition goals set during dietitian appointment.;Long Term Goal:  Adherence to prescribed nutrition plan.      Nutrition Discharge: Rate Your Plate Scores:     Nutrition Assessments - 10/19/16 1124      Rate Your Plate Scores   Pre Score 60   Pre Score % 66.7 %   Post Score 72   Post Score % 80 %   % Change 13.3 %      Nutrition Goals Re-Evaluation:     Nutrition Goals Re-Evaluation    Row Name 08/23/16 1741 09/27/16 1745           Goals   Nutrition Goal Cont to eat healthy.   -      Comment I offered him several appts with the Cardiac Rehab Registered Dietician. He said he will let usKoreanow if he decides he wants to meet with the dietiican.  MaElta Guadeloupeaid he has been reading labels.  Personal Goal #2 Re-Evaluation   Personal Goal #2  - has increased his water intake.        Personal Goal #3 Re-Evaluation   Personal Goal #3  - Elta Guadeloupe said he has tried to increase his fruite and vegetable intake.         Personal Goal #4 Re-Evaluation   Personal Goal #4  - Elta Guadeloupe said the Dietician helped him check the label on his Plexus supplement.          Nutrition Goals Discharge (Final Nutrition Goals Re-Evaluation):     Nutrition Goals Re-Evaluation - 09/27/16 1745      Goals   Comment Elta Guadeloupe said he has been reading labels.      Personal Goal #2 Re-Evaluation   Personal Goal #2 has increased his water intake.     Personal Goal #3 Re-Evaluation   Personal Goal #3 Elta Guadeloupe said he has tried to increase his fruite and vegetable intake.      Personal Goal #4 Re-Evaluation   Personal Goal #4 Elta Guadeloupe said the Dietician helped him check the label on his Plexus supplement.       Psychosocial: Target Goals: Acknowledge presence or absence of significant depression and/or stress, maximize coping skills, provide positive support system. Participant is able to verbalize types and ability to use techniques and skills needed for reducing stress and depression.   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 Yes  wife and friends     Barriers   Psychosocial barriers to participate in program There are no identifiable barriers or psychosocial needs.;The patient should benefit from training in stress management and relaxation.     Screening Interventions   Interventions Encouraged to exercise      Quality of Life Scores:      Quality of Life - 10/19/16 1125      Quality of Life Scores   Health/Function Pre 26.3 %   Health/Function Post 27.9 %   Health/Function % Change 6.08 %   Socioeconomic Pre 30 %   Socioeconomic Post 30 %   Socioeconomic % Change  0 %   Psych/Spiritual Pre 27.21 %   Psych/Spiritual Post 28.93 %   Psych/Spiritual % Change 6.32 %   Family Pre 28.8 %   Family Post 30 %   Family % Change 4.17 %   GLOBAL Pre 27.62 %   GLOBAL Post 28.85 %   GLOBAL % Change 4.45 %      PHQ-9: Recent Review Flowsheet Data    Depression screen [facility] 2/9 10/19/2016 08/02/2016   Decreased Interest - 0   Down, Depressed, Hopeless - 0   PHQ - 2 Score - 0   Altered sleeping 0 0   Tired, decreased energy 1 1   Change in appetite 0 0   Feeling bad or failure about yourself  0 0   Trouble concentrating 0 0   Moving slowly or fidgety/restless 0 0   Suicidal thoughts 0 0   PHQ-9 Score - 1   Difficult doing work/chores Not difficult at all Not difficult at all     Interpretation of Total Score  Total Score Depression Severity:  1-4 = Minimal depression, 5-9 = Mild  depression, 10-14 = Moderate depression, 15-19 = Moderately severe depression, 20-27 = Severe depression   Psychosocial Evaluation and Intervention:     Psychosocial Evaluation - 08/30/16 1738      Psychosocial Evaluation & Interventions   Interventions Encouraged to exercise with the program and follow exercise  prescription;Stress management education   Comments Counselor met with Mr. A today for initial psychosocial evaluation.  He is a 66 year old who had a stent inserted late November.  He is a Theme park manager of a church; has been married 76 years and has adult children who are all his strong support system.  He states that he sleeps well and has a good appetite.  He denies a history of depression or anxiety or current symptoms; although he mentioned low energy lately - hoping the recent adjustment in medication will correct that.  He reports typically being in a positive mood.  His current stressors are a close friend and member of his congregation was "killed in a car accident" recently and that has been difficult.  Also, there is some family stress related to his daughter losing her job.  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  His goals for this program are to lose some weight and to increase his energy levels.  He will continue playing golf; walking and may join the gym to work out with his spouse upon completion of this program.  Staff will continue to follow with Mr. A.      Psychosocial Re-Evaluation:     Psychosocial Re-Evaluation    [place] Name 08/23/16 1746 09/15/16 1708 09/27/16 1743 10/04/16 1729       Psychosocial Re-Evaluation   Comments When I mentioned to Vernia Buff that we have had a lot of ministers lately in Cardiac Rehab he acknowledged that it is a stressful job. He said he has deacons that help him but he has a lot of sick members. He was also in bed sick for one week last week so hopes to get to stay in [place] without any more flu episodes.  Counselor follow up with Mr. Sheets reporting feeling much more energy since coming into this program. This could be a result of his Dr. Gerrianne Scale one of his Rx (Lipitor), but also his stress levels are down as a result of working out more consistently.  Counselor followed on his recent losses of loved ones and Mr. Loni Muse states he and his congregation are  doing better with time.  The financial stress he was facing has resolved itself and he is relieved by that as well.  Counselor commended Mr. A on all his hard work and consistency in exercise and healthier coping strategies.   Elta Guadeloupe said he has a stressful job since he is the only paid staff member but he does have some volunteer help. Elta Guadeloupe recently returned from having some time off of work and got refreshed.  Counselor follow up with Mr. Loni Muse today stating "busy weekend" with multiple church activities and a funeral on Friday.  He has noted more awareness of carrying stress in his neck and shoulders and is using strategies (moving more) to help get some relief.  He reports sleeping well most of the time and continuing to enjoy this program.  Counselor commended him on his progress and use of stress management techniques and learning to relax.     Interventions  -  -  - Stress management education;Relaxation education       Psychosocial Discharge (Final Psychosocial Re-Evaluation):  Psychosocial Re-Evaluation - 10/04/16 1729      Psychosocial Re-Evaluation   Comments Counselor follow up with Mr. A today stating "busy weekend" with multiple church activities and a funeral on Friday.  He has noted more awareness of carrying stress in his neck and shoulders and is using strategies (moving more) to help get some relief.  He reports sleeping well most of the time and continuing to enjoy this program.  Counselor commended him on his progress and use of stress management techniques and learning to relax.    Interventions Stress management education;Relaxation education      Vocational Rehabilitation: Provide vocational rehab assistance to qualifying candidates.   Vocational Rehab Evaluation & Intervention:     Vocational Rehab - 08/02/16 1424      Initial Vocational Rehab Evaluation & Intervention   Assessment shows need for Vocational Rehabilitation No      Education: Education Goals:  Education classes will be provided on a weekly basis, covering required topics. Participant will state understanding/return demonstration of topics presented.  Learning Barriers/Preferences:     Learning Barriers/Preferences - 08/02/16 1441      Learning Barriers/Preferences   Learning Barriers None   Learning Preferences None      Education Topics: General Nutrition Guidelines/Fats and Fiber: -Group instruction provided by verbal, written material, models and posters to present the general guidelines for heart healthy nutrition. Gives an explanation and review of dietary fats and fiber.   Cardiac Rehab from 10/18/2016 in [facility] Cardiac and Pulmonary Rehab  Date  10/18/16  Educator  Beverly Gust  Instruction Review Code  2- meets goals/outcomes      Controlling Sodium/Reading Food Labels: -Group verbal and written material supporting the discussion of sodium use in heart healthy nutrition. Review and explanation with models, verbal and written materials for utilization of the food label.   Cardiac Rehab from 10/18/2016 in [facility] Cardiac and Pulmonary Rehab  Date  08/30/16  Educator  PI  Instruction Review Code  2- meets goals/outcomes      Exercise Physiology & Risk Factors: - Group verbal and written instruction with models to review the exercise physiology of the cardiovascular system and associated critical values. Details cardiovascular disease risk factors and the goals associated with each risk factor.   Cardiac Rehab from 10/18/2016 in [facility] Cardiac and Pulmonary Rehab  Date  09/06/16  Educator  [facility]  Instruction Review Code  2- meets goals/outcomes      Aerobic Exercise & Resistance Training: - Gives group verbal and written discussion on the health impact of inactivity. On the components of aerobic and resistive training programs and the benefits of this training and how to safely progress through these programs.   Cardiac Rehab from 10/18/2016 in [facility] Cardiac and Pulmonary Rehab   Date  09/08/16  Educator  AS  Instruction Review Code  2- meets goals/outcomes      Flexibility, Balance, General Exercise Guidelines: - Provides group verbal and written instruction on the benefits of flexibility and balance training programs. Provides general exercise guidelines with specific guidelines to those with heart or lung disease. Demonstration and skill practice provided.   Cardiac Rehab from 10/18/2016 in [facility] Cardiac and Pulmonary Rehab  Date  09/13/16  Educator  SB  Instruction Review Code  2- meets goals/outcomes      Stress Management: - Provides group verbal and written instruction about the health risks of elevated stress, cause of high stress, and healthy ways to reduce stress.   Depression: - Provides  group verbal and written instruction on the correlation between heart/lung disease and depressed mood, treatment options, and the stigmas associated with seeking treatment.   Cardiac Rehab from 10/18/2016 in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

## 2016-11-16 ENCOUNTER — Encounter: Payer: Self-pay | Admitting: *Deleted

## 2016-11-16 ENCOUNTER — Telehealth: Payer: Self-pay | Admitting: *Deleted

## 2016-11-16 DIAGNOSIS — Z006 Encounter for examination for normal comparison and control in clinical research program: Secondary | ICD-10-CM

## 2016-11-16 NOTE — Progress Notes (Signed)
TWILIGHT research study month 4 telephone follow up completed. Patient states he has not had any adverse events and has tolerated Brilinta ASA/Placebo. Next research required visit is due no later than 25/SEP/2018.

## 2016-11-16 NOTE — Telephone Encounter (Signed)
Left message for patient to call research office for month 4 telephone follow up for the TWILIGHT research study.

## 2017-01-20 ENCOUNTER — Other Ambulatory Visit: Payer: Self-pay | Admitting: Nurse Practitioner

## 2017-01-20 NOTE — Telephone Encounter (Signed)
Please advise if ok to refill Nitro?

## 2017-02-02 ENCOUNTER — Encounter: Payer: Self-pay | Admitting: Internal Medicine

## 2017-02-02 ENCOUNTER — Ambulatory Visit (INDEPENDENT_AMBULATORY_CARE_PROVIDER_SITE_OTHER): Payer: Medicare Other | Admitting: Internal Medicine

## 2017-02-02 VITALS — BP 112/70 | HR 65 | Ht 73.0 in | Wt 209.2 lb

## 2017-02-02 DIAGNOSIS — I2510 Atherosclerotic heart disease of native coronary artery without angina pectoris: Secondary | ICD-10-CM | POA: Diagnosis not present

## 2017-02-02 DIAGNOSIS — K22719 Barrett's esophagus with dysplasia, unspecified: Secondary | ICD-10-CM

## 2017-02-02 NOTE — Progress Notes (Signed)
Follow-up Outpatient Visit Date: 02/02/2017  Primary Care Provider: Owens Loffler, MD [place] [address]  Chief Complaint: Follow-up coronary artery disease  HPI:  Mr. Morones is a 66 y.o. year-old male with history of coronary artery disease status post PCI to the mid LAD in the setting of unstable angina in November, 2017, Barrett's esophagus, and lower extremity venous insufficiency, who presents for follow-up of coronary artery disease. I last saw him in 10/2016, which time he was recovering well from his PCI. He notes intermittent upper back discomfort that radiates to both side of his chest, which has been present for years. He attributes it to back problems, as it occurs most frequently when he is seated at his desk. He does not have any exertional chest pain or shortness of breath. He also notes that his long-standing pain is different than what he experienced leading up to his hospitalization in November. He remained active, though he has not been exercising as much the last few weeks. He continues to play golf without any difficulties. He is tolerating his current medications well, including ticagrelor as part of the twilight study. He notes occasional bruising but no significant bleeding. He denies palpitations, lightheadedness, orthopnea, and PND. His chronic dependent leg edema is unchanged. He does not wear compression stockings regularly.Marland Kitchen  --------------------------------------------------------------------------------------------------  Cardiovascular History & Procedures: Cardiovascular Problems:  Coronary artery disease status post PCI to the mid LAD in the setting of unstable angina (06/2016)  Chronic venous insufficiency  Risk Factors:  Known coronary artery disease, male gender, and age greater than 35  Cath/PCI:  LHC/PCI (07/14/16): LMCA normal. Ostial LAD with 25% stenosis. Mid LAD with 95% lesion. LCx with minimal diffuse disease. RCA  normal. Successful PCI to the mid LAD with placement of a Promus Premier 3.0 x 24 mm drug-eluting stent.  CV Surgery:  None  EP Procedures and Devices:  None  Non-Invasive Evaluation(s):  Normal LV size and function. LVEF 55-60%. Mild AI. Trivial MR. Lipomatous hypertrophy of the intra-atrial septum. Trivial TR. Trivial PR. Normal RV size and function.  Recent CV Pertinent Labs: Lab Results  Component Value Date   CHOL 68 10/11/2016   CHOL 67 (L) 08/25/2016   HDL 24 (L) 10/11/2016   HDL 26 (L) 08/25/2016   LDLCALC 27 10/11/2016   LDLCALC 22 08/25/2016   TRIG 86 10/11/2016   CHOLHDL 2.8 10/11/2016   INR 1.13 07/14/2016   K 4.4 10/14/2016   BUN 12 10/14/2016   CREATININE 1.20 10/14/2016    Past medical and surgical history were reviewed and updated in EPIC.  Outpatient Encounter Prescriptions as of 02/02/2017  Medication Sig  . AMBULATORY NON FORMULARY MEDICATION Take 90 mg by mouth 2 (two) times daily. Medication Name: BRILINTA 90 MG bid (TWILIGHT Research Study PROVIDED)  . AMBULATORY NON FORMULARY MEDICATION Take 81 mg by mouth daily. Medication Name: ASPIRIN 81 mg Daily (TWILIGHT Research study PROVIDED)  . Ascorbic Acid (VITAMIN C PO) Take 1 tablet by mouth daily.  Marland Kitchen atorvastatin (LIPITOR) 10 MG tablet Take 1 tablet (10 mg total) by mouth daily.  . folic acid (FOLVITE) 321 MCG tablet Take 400 mcg by mouth daily.  . Multiple Vitamins-Minerals (ZINC PO) Take 1 tablet by mouth daily.  . nitroGLYCERIN (NITROSTAT) 0.4 MG SL tablet PLACE 1 TABLET UNDER THE TONGUE EVERY 5 MINUTES AS NEEDED FOR CHEST PAIN. MAX 3 DOSES  . omeprazole (PRILOSEC) 40 MG capsule Take 40 mg by mouth daily.   Marland Kitchen  saw palmetto 80 MG capsule Take 80 mg by mouth daily.   . vitamin B-12 500 MCG tablet Take 1 tablet (500 mcg total) by mouth daily.   No facility-administered encounter medications on file as of 02/02/2017.     Allergies: Penicillins  Social History   Social History  . Marital status:  Married    Spouse name: N/A  . Number of children: 3  . Years of education: N/A   Occupational History  . [employer], [employer]   Social History Main Topics  . Smoking status: Never Smoker  . Smokeless tobacco: Never Used  . Alcohol use No  . Drug use: No  . Sexual activity: Not on file   Other Topics Concern  . 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       Family History  Problem Relation Age of Onset  . Hypertension Father   . Diabetes Father   . Heart disease Father   . Lymphoma Mother   . Alcohol abuse Paternal Grandfather     Review of Systems: A 12-system review of systems was performed and was negative except as noted in the HPI.  --------------------------------------------------------------------------------------------------  Physical Exam: BP 112/70 (BP Location: Left Arm, Patient Position: Sitting, Cuff Size: Normal)   Pulse 65   Ht 6\' 1"  (1.854 m)   Wt 209 lb 4 oz (94.9 kg)   BMI 27.61 kg/m   General:  Well-developed, well-nourished man seated comfortably in the exam room. HEENT: No conjunctival pallor or scleral icterus.  Moist mucous membranes.  OP clear. Neck: Supple without lymphadenopathy, thyromegaly, JVD, or HJR.  No carotid bruit. Lungs: Normal work of breathing.  Clear to auscultation bilaterally without wheezes or crackles. Heart: Regular rate and rhythm without murmurs, rubs, or gallops.  Non-displaced PMI. Abd: Bowel sounds present.  Soft, NT/ND without hepatosplenomegaly Ext: 1+ ankle edema bilaterally.  Radial, PT, and DP pulses are 2+ bilaterally. Skin: Warm and dry without rash. Faint ecchymosis noted on the left forearm.  Lab Results  Component Value Date   WBC 4.3 10/14/2016   HGB 13.9 10/14/2016   HCT 42.0 10/14/2016   MCV 84.6 10/14/2016   PLT 131.0 (L) 10/14/2016    Lab Results  Component Value Date   NA 140 10/14/2016   K  4.4 10/14/2016   CL 107 10/14/2016   CO2 29 10/14/2016   BUN 12 10/14/2016   CREATININE 1.20 10/14/2016   GLUCOSE 103 (H) 10/14/2016   ALT 19 10/11/2016    Lab Results  Component Value Date   CHOL 68 10/11/2016   HDL 24 (L) 10/11/2016   LDLCALC 27 10/11/2016   TRIG 86 10/11/2016   CHOLHDL 2.8 10/11/2016    --------------------------------------------------------------------------------------------------  ASSESSMENT AND PLAN: Coronary artery disease without angina Mr. Russon continues to do well following PCI to the mid LAD in 06/2016. His long-standing upper back and chest pain is likely musculoskeletal in nature. He has not had any recurrence of angina that prompted his hospitalization last fall. We will continue his current medication regimen, including antiplatelet therapy as part of the Twilight study. He is not currently on a beta blocker due to low resting heart rate. We will continue with his current statin dose (atorvastatin 10 mg daily) given his low LDL.  Barrett's esophagus No symptoms at this time. Mr. Rivet inquires as to when it would be reasonable for  him to undergo routine surveillance EGD. Given that he presented with unstable angina and 06/2016, I would favor continuing antiplatelet therapy for 12 months from this time of stent implantation before stopping. He is agreeable with this. He should remain on a PPI.  Follow-up: Return to clinic in 06/2017.  Nelva Bush, MD 02/02/2017 9:18 AM

## 2017-02-02 NOTE — Patient Instructions (Signed)
Medication Instructions:  Your physician recommends that you continue on your current medications as directed. Please refer to the Current Medication list given to you today.   Labwork: none  Testing/Procedures: none  Follow-Up: Your physician wants you to follow-up in: ABOUT 5 MONTHS (EARLY TO MID November) WITH DR END. You will receive a reminder letter in the mail two months in advance. If you don't receive a letter, please call our office to schedule the follow-up appointment.  If you need a refill on your cardiac medications before your next appointment, please call your pharmacy.

## 2017-04-25 ENCOUNTER — Encounter: Payer: Self-pay | Admitting: Internal Medicine

## 2017-04-25 ENCOUNTER — Ambulatory Visit: Payer: Medicare Other | Admitting: Nurse Practitioner

## 2017-04-25 ENCOUNTER — Ambulatory Visit (INDEPENDENT_AMBULATORY_CARE_PROVIDER_SITE_OTHER): Payer: Medicare Other | Admitting: Internal Medicine

## 2017-04-25 VITALS — BP 118/74 | HR 52 | Temp 98.2°F | Wt 209.0 lb

## 2017-04-25 DIAGNOSIS — J00 Acute nasopharyngitis [common cold]: Secondary | ICD-10-CM

## 2017-04-25 DIAGNOSIS — I2510 Atherosclerotic heart disease of native coronary artery without angina pectoris: Secondary | ICD-10-CM | POA: Diagnosis not present

## 2017-04-25 MED ORDER — AZITHROMYCIN 250 MG PO TABS: ORAL_TABLET | ORAL | 0 refills | Status: DC

## 2017-04-25 NOTE — Progress Notes (Signed)
HPI  Pt presents to the clinic today with c/o fatigue, ear pain, nasal congestion, sore throat and cough. He reports he has not been feeling well for about 5 weeks. 2 days ago, he started having sharp ear pains. He denies decreased hearing. He reports he is blowing yellow/green mucous out of his nose. He denies difficulty swallowing. The cough is non productive. He reports he ran fevers up to 99.3. He has taken Sudafed, Cholr-tabs with minimal relief. He has a history of allergies but denies breathing problems. He has not had sick contacts that he is aware of.  Review of Systems        Past Medical History:  Diagnosis Date  . Allergic rhinitis   . Arthritis   . CAD (coronary artery disease)    a. 06/2016 Echo: EF 55-60%, no rwma, mild AI, triv MR/TR/PR, lipomatous hypertrophy of atrial septum;  b. 06/2016 Ex MV: EF 56%, large, partially reversible anteroseptal, inferoseptal, inferior,and apical perfusion defect - borderline for ischemia (TID 1.64) - equivocal study;  c. 06/2016 PCI: LM nl, LAD 25p/34m (3.0x24 Promus Premier MR DES), LCX min irregs, RCA nl.  . Colon polyps    a. Severe dysplasia in a right-sided polyp removed surgically in [place], 2006--> final pathology showed this was not a cancer.  Follow up colonoscopy at one year interval was normal.  . Erectile dysfunction   . GERD (gastroesophageal reflux disease)    H/o Barrett's esophagus, orginally diagnosed in 2006, biopsies showed no dysplasia; follow up EGD also showed no dysplasia  . History of BPH   . Lyme disease    x2    Family History  Problem Relation Age of Onset  . Hypertension Father   . Diabetes Father   . Heart disease Father   . Lymphoma Mother   . Alcohol abuse Paternal Grandfather     Social History   Social History  . Marital status: Married    Spouse name: N/A  . Number of children: 3  . Years of education: N/A   Occupational History  . [employer], [place]    Social History Main Topics  . Smoking status: Never Smoker  . Smokeless tobacco: Never Used  . Alcohol use No  . Drug use: No  . Sexual activity: Not on file   Other Topics Concern  . 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       Allergies  Allergen Reactions  . Penicillins     REACTION: Rash as a child - Tolerated AMOXICILLIN SEVERAL TIMES AS ADULT RECENTLY Has patient had a PCN reaction causing immediate rash, facial/tongue/throat swelling, SOB or lightheadedness with hypotension: YES Has patient had a PCN reaction causing severe rash involving mucus membranes or skin necrosis:NO Has patient had a PCN reaction that required hospitalization NO Has patient had a PCN reaction occurring within the last 10 years: NO If all of the above answers are "NO", then may proceed with      Constitutional: Positive fatigue. Denies headache, fever or abrupt weight changes.  HEENT:  Positive ear pain, nasal congestion, sore throat. Denies eye redness, eye pain, pressure behind the eyes, facial pain, ear pain, ringing in the ears, wax buildup, runny nose or bloody nose. Respiratory: Positive cough. Denies difficulty breathing or shortness of breath.  Cardiovascular: Denies chest pain, chest tightness, palpitations or swelling in the hands or feet.  No other specific complaints in a complete review of systems (except as listed in HPI above).  Objective:   BP 118/74   Pulse (!) 52   Temp 98.2 F (36.8 C) (Oral)   Wt 209 lb (94.8 kg)   SpO2 100%   BMI 27.57 kg/m  Wt Readings from Last 3 Encounters:  04/25/17 209 lb (94.8 kg)  02/02/17 209 lb 4 oz (94.9 kg)  11/02/16 218 lb 12 oz (99.2 kg)     General: Appears his stated age, in NAD. HEENT: Head: normal shape and size, no sinus tenderness noted; Ears: Tm's gray and intact, normal light reflex; Nose: mucosa pink and moist, septum midline; Throat/Mouth: + PND. Teeth  present, mucosa pink and moist, no exudate noted, no lesions or ulcerations noted.  Neck: No cervical lymphadenopathy.  Cardiovascular: Normal rate and rhythm. S1,S2 noted.  No murmur, rubs or gallops noted.  Pulmonary/Chest: Normal effort and positive vesicular breath sounds. No respiratory distress. No wheezes, rales or ronchi noted.       Assessment & Plan:   Upper Respiratory Infection:  Get some rest and drink plenty of water Do salt water gargles for the sore throat eRx for Azithromax x 5 days Delsym as needed for cough  RTC as needed or if symptoms persist.   Webb Silversmith, NP

## 2017-04-25 NOTE — Patient Instructions (Signed)
Upper Respiratory Infection, Adult Most upper respiratory infections (URIs) are caused by a virus. A URI affects the nose, throat, and upper air passages. The most common type of URI is often called "the common cold." Follow these instructions at home:  Take medicines only as told by your doctor.  Gargle warm saltwater or take cough drops to comfort your throat as told by your doctor.  Use a warm mist humidifier or inhale steam from a shower to increase air moisture. This may make it easier to breathe.  Drink enough fluid to keep your pee (urine) clear or pale yellow.  Eat soups and other clear broths.  Have a healthy diet.  Rest as needed.  Go back to work when your fever is gone or your doctor says it is okay. ? You may need to stay home longer to avoid giving your URI to others. ? You can also wear a face mask and wash your hands often to prevent spread of the virus.  Use your inhaler more if you have asthma.  Do not use any tobacco products, including cigarettes, chewing tobacco, or electronic cigarettes. If you need help quitting, ask your doctor. Contact a doctor if:  You are getting worse, not better.  Your symptoms are not helped by medicine.  You have chills.  You are getting more short of breath.  You have brown or red mucus.  You have yellow or brown discharge from your nose.  You have pain in your face, especially when you bend forward.  You have a fever.  You have puffy (swollen) neck glands.  You have pain while swallowing.  You have white areas in the back of your throat. Get help right away if:  You have very bad or constant: ? Headache. ? Ear pain. ? Pain in your forehead, behind your eyes, and over your cheekbones (sinus pain). ? Chest pain.  You have long-lasting (chronic) lung disease and any of the following: ? Wheezing. ? Long-lasting cough. ? Coughing up blood. ? A change in your usual mucus.  You have a stiff neck.  You have  changes in your: ? Vision. ? Hearing. ? Thinking. ? Mood. This information is not intended to replace advice given to you by your health care provider. Make sure you discuss any questions you have with your health care provider. Document Released: 01/19/2008 Document Revised: 04/04/2016 Document Reviewed: 11/07/2013 Elsevier Interactive Patient Education  2018 Elsevier Inc.  

## 2017-05-03 ENCOUNTER — Encounter: Payer: Self-pay | Admitting: *Deleted

## 2017-05-03 DIAGNOSIS — Z006 Encounter for examination for normal comparison and control in clinical research program: Secondary | ICD-10-CM

## 2017-05-03 NOTE — Progress Notes (Signed)
TWILIGHT Research study month 9 follow up visit completed. Patient states that he had a bruise the size of a "silver dollar" on his left forearm. He stated he bumped his arm on 31/Aug/2018, and it only lasted 4 days (04/sep/2018). No other adverse events occurred. He has been Compliant with medication. After pill count was 86% compliant with ASA/Placebo and 106 % compliant with Brilinta. Patient thinks he may have some Brilinta at home in pill dispenser, he is not sure. The following pill bottles were dispensed [id number]; [id number]; [id number]. Patient states that he is over due an endoscopy to monitor his barrett's esophagus. I instructed him that cessation of both study drug and Brilinta iis allowed during the study, remember to ask his cardiologist for permission. The next research required visit is due within this window 23/Jan/2019-24/Mar/2019. At that visit he will no longer receive ASA/Placebo or Brilinta. Further antiplatelet therapy (Including ASA) will be at the discretion of his Cardiologist (Dr. Saunders Revel). Questions encouraged and answered.

## 2017-05-31 DIAGNOSIS — Z23 Encounter for immunization: Secondary | ICD-10-CM | POA: Diagnosis not present

## 2017-07-20 ENCOUNTER — Ambulatory Visit (INDEPENDENT_AMBULATORY_CARE_PROVIDER_SITE_OTHER): Payer: Medicare Other | Admitting: Internal Medicine

## 2017-07-20 ENCOUNTER — Encounter: Payer: Self-pay | Admitting: Internal Medicine

## 2017-07-20 VITALS — BP 123/78 | HR 59 | Ht 73.0 in | Wt 207.5 lb

## 2017-07-20 DIAGNOSIS — E785 Hyperlipidemia, unspecified: Secondary | ICD-10-CM | POA: Insufficient documentation

## 2017-07-20 DIAGNOSIS — R0789 Other chest pain: Secondary | ICD-10-CM

## 2017-07-20 DIAGNOSIS — R42 Dizziness and giddiness: Secondary | ICD-10-CM | POA: Diagnosis not present

## 2017-07-20 DIAGNOSIS — I2510 Atherosclerotic heart disease of native coronary artery without angina pectoris: Secondary | ICD-10-CM | POA: Diagnosis not present

## 2017-07-20 HISTORY — DX: Hyperlipidemia, unspecified: E78.5

## 2017-07-20 MED ORDER — NITROGLYCERIN 0.4 MG SL SUBL: SUBLINGUAL_TABLET | SUBLINGUAL | 5 refills | Status: DC

## 2017-07-20 NOTE — Patient Instructions (Signed)
Medication Instructions:  Your physician has recommended you make the following change in your medication:  1- AT THE END OF THE TWILIGHT STUDY (APPROX. MARCH 2019), Start taking Aspirin 81 mg by mouth once a day.   Labwork: none  Testing/Procedures: Your physician has requested that you have an exercise tolerance test. For further information please visit [email or web address]. Please also follow instruction sheet, as given.    Follow-Up: Your physician wants you to follow-up in: 6 MONTHS WITH DR END. You will receive a reminder letter in the mail two months in advance. If you don't receive a letter, please call our office to schedule the follow-up appointment.   If you need a refill on your cardiac medications before your next appointment, please call your pharmacy.    Exercise Stress Electrocardiogram An exercise stress electrocardiogram is a test to check how blood flows to your heart. It is done to find areas of poor blood flow. You will need to walk on a treadmill for this test. The electrocardiogram will record your heartbeat when you are at rest and when you are exercising. What happens before the procedure?  Do not have drinks with caffeine or foods with caffeine for 24 hours before the test, or as told by your doctor. This includes coffee, tea (even decaf tea), sodas, chocolate, and cocoa.  Follow your doctor's instructions about eating and drinking before the test.  Ask your doctor what medicines you should or should not take before the test. Take your medicines with water unless told by your doctor not to.  If you use an inhaler, bring it with you to the test.  Bring a snack to eat after the test.  Do not  smoke for 4 hours before the test.  Do not put lotions, powders, creams, or oils on your chest before the test.  Wear comfortable shoes and clothing. What happens during the procedure?  You will have patches put on your chest. Small areas of your chest may need  to be shaved. Wires will be connected to the patches.  Your heart rate will be watched while you are resting and while you are exercising.  You will walk on the treadmill. The treadmill will slowly get faster to raise your heart rate.  The test will take about 1-2 hours. What happens after the procedure?  Your heart rate and blood pressure will be watched after the test.  You may return to your normal diet, activities, and medicines or as told by your doctor. This information is not intended to replace advice given to you by your health care provider. Make sure you discuss any questions you have with your health care provider. Document Released: 01/19/2008 Document Revised: 03/31/2016 Document Reviewed: 04/09/2013 Elsevier Interactive Patient Education  [employer].

## 2017-07-20 NOTE — Progress Notes (Signed)
Follow-up Outpatient Visit Date: 07/20/2017  Primary Care Provider: Owens Loffler, MD [place] [address]  Chief Complaint: Follow-up coronary artery disease  HPI:  Phillip [name] is a 66 y.o. year-old male with history of coronary artery disease status post PCI to the mid LAD in the setting of unstable angina in 06/2016, Barrett's esophagus, and lower extremity venous insufficiency, who presents for follow-up of coronary artery disease.  I last saw Phillip [name] in June, at which time he was feeling well except for intermittent upper back pain radiating around both sides of his chest that has been present for years.  It typically occurs when he is seated at his desk.  He denied exertional chest pain and shortness of breath.  We deferred additional testing at that time.  Today, Phillip [name] reports he feels about the same.  He continues to have occasional neck and chest pain, typically when seated at work or in a stressful situation.  He continues to exercise regularly without any symptoms.  He denies shortness of breath, palpitations, orthopnea, PND, and edema.  He has noted 7-8 episodes of lightheadedness over the last year.  One occurred while he was cutting wood.  Most often only, they occur when he stands up quickly.  He has not fallen or passed out.  Phillip [name] remains in the Twilight study.  He has not had any significant bleeding.  He inquires about when it would be appropriate for him to undergo routine surveillance EGD for his history of Barrett's esophagus.  --------------------------------------------------------------------------------------------------  Cardiovascular History & Procedures: Cardiovascular Problems:  Coronary artery disease status post PCI to the mid LAD in the setting of unstable angina (06/2016)  Chronic venous insufficiency  Risk Factors:  Known coronary artery disease, male gender, and age greater than 5  Cath/PCI:  LHC/PCI  (07/14/16): LMCA normal. Ostial LAD with 25% stenosis. Mid LAD with 95% lesion. LCx with minimal diffuse disease. RCA normal. Successful PCI to the mid LAD with placement of a Promus Premier 3.0 x 24 mm drug-eluting stent.  CV Surgery:  None  EP Procedures and Devices:  None  Non-Invasive Evaluation(s):  Normal LV size and function. LVEF 55-60%. Mild AI. Trivial MR. Lipomatous hypertrophy of the intra-atrial septum. Trivial TR. Trivial PR. Normal RV size and function.  Recent CV Pertinent Labs: Lab Results  Component Value Date   CHOL 68 10/11/2016   CHOL 67 (L) 08/25/2016   HDL 24 (L) 10/11/2016   HDL 26 (L) 08/25/2016   LDLCALC 27 10/11/2016   LDLCALC 22 08/25/2016   TRIG 86 10/11/2016   CHOLHDL 2.8 10/11/2016   INR 1.13 07/14/2016   K 4.4 10/14/2016   BUN 12 10/14/2016   CREATININE 1.20 10/14/2016    Past medical and surgical history were reviewed and updated in EPIC.  Current Meds  Medication Sig  . AMBULATORY NON FORMULARY MEDICATION Take 90 mg by mouth 2 (two) times daily. Medication Name: BRILINTA 90 MG bid (TWILIGHT Research Study PROVIDED)  . AMBULATORY NON FORMULARY MEDICATION Take 81 mg by mouth daily. Medication Name: ASPIRIN 81 mg Daily (TWILIGHT Research study PROVIDED)  . Ascorbic Acid (VITAMIN C PO) Take 1 tablet by mouth daily.  Marland Kitchen atorvastatin (LIPITOR) 10 MG tablet Take 1 tablet (10 mg total) by mouth daily.  . folic acid (FOLVITE) 734 MCG tablet Take 400 mcg by mouth daily.  . Multiple Vitamins-Minerals (ZINC PO) Take 1 tablet by mouth daily.  . nitroGLYCERIN (NITROSTAT) 0.4 MG SL tablet  PLACE 1 TABLET UNDER THE TONGUE EVERY 5 MINUTES AS NEEDED FOR CHEST PAIN. MAX 3 DOSES  . omeprazole (PRILOSEC) 40 MG capsule Take 40 mg by mouth daily.   . saw palmetto 80 MG capsule Take 80 mg by mouth daily.   . vitamin B-12 500 MCG tablet Take 1 tablet (500 mcg total) by mouth daily.    Allergies: Penicillins  Social History   Socioeconomic History  .  Marital status: Married    Spouse name: Not on file  . Number of children: 3  . Years of education: Not on file  . Highest education level: Not on file  Social Needs  . Financial resource strain: Not on file  . Food insecurity - worry: Not on file  . Food insecurity - inability: Not on file  . Transportation needs - medical: Not on file  . Transportation needs - non-medical: Not on file  Occupational History  . Occupation: Theme park manager, [place]: [employer]  Tobacco Use  . Smoking status: Never Smoker  . Smokeless tobacco: Never Used  Substance and Sexual Activity  . Alcohol use: No  . Drug use: No  . Sexual activity: Not on file  Other Topics Concern  . Not on file  Social History Narrative   Lives in [place] with his wife.  Tax inspector.    Family History  Problem Relation Age of Onset  . Hypertension Father   . Diabetes Father   . Heart disease Father   . Lymphoma Mother   . Alcohol abuse Paternal Grandfather     Review of Systems: A 12-system review of systems was performed and was negative except as noted in the HPI.  --------------------------------------------------------------------------------------------------  Physical Exam: BP 123/78 (BP Location: Left Arm, Patient Position: Sitting, Cuff Size: Normal)   Pulse (!) 59   Ht 6\' 1"  (1.854 m)   Wt 207 lb 8 oz (94.1 kg)   BMI 27.38 kg/m    Position Blood pressure (mmHg) Heart rate (bpm)  Lying  126/78  57  Sitting  123/72  62  Standing  120/73  65  Standing (3 minutes)  114/71  63   General: Well-developed, well-nourished man, seated comfortably in the exam room. HEENT: No conjunctival pallor or scleral icterus. Moist mucous membranes.  OP clear. Neck: Supple without lymphadenopathy, thyromegaly, JVD, or HJR. Lungs: Normal work of breathing. Clear to auscultation bilaterally without wheezes or crackles. Heart: Bradycardic but regular without murmurs, rubs, or  gallops. Non-displaced PMI. Abd: Bowel sounds present. Soft, NT/ND without hepatosplenomegaly Ext: No lower extremity edema. Radial, PT, and DP pulses are 2+ bilaterally. Skin: Warm and dry without rash.  EKG: Sinus bradycardia (heart rate 57 bpm).  Otherwise, no significant abnormalities.  Lab Results  Component Value Date   WBC 4.3 10/14/2016   HGB 13.9 10/14/2016   HCT 42.0 10/14/2016   MCV 84.6 10/14/2016   PLT 131.0 (L) 10/14/2016    Lab Results  Component Value Date   NA 140 10/14/2016   K 4.4 10/14/2016   CL 107 10/14/2016   CO2 29 10/14/2016   BUN 12 10/14/2016   CREATININE 1.20 10/14/2016   GLUCOSE 103 (H) 10/14/2016   ALT 19 10/11/2016    Lab Results  Component Value Date   CHOL 68 10/11/2016   HDL 24 (L) 10/11/2016   LDLCALC 27 10/11/2016   TRIG 86 10/11/2016   CHOLHDL 2.8 10/11/2016    --------------------------------------------------------------------------------------------------  ASSESSMENT AND PLAN: Coronary  artery disease with atypical chest pain No symptoms reminiscent of what Phillip [name] developed prior to his PCI last year.  He continues to have episodic neck and chest pain, which seems positional or related to stress.  We have agreed to perform an exercise tolerance test for further evaluation.  We will continue with his Twilight study medications.  Once his participation in the study is complete, I have recommended that he take aspirin 81 mg daily without a P2Y12 inhibitor.  At that point, it would also be reasonable for him to undergo EGD +/- colonoscopy.  Low-dose aspirin should be continued in the perioperative period.  Lightheadedness This sounds orthostatic.  Blood pressure and orthostatic vital signs today are normal.  Phillip [name] is not on any blood pressure or heart rate lowering medications (other than ticagrelor, which can cause some bradycardia).  I encouraged him to stay well-hydrated.  Hyperlipidemia LDL has been well controlled with  low-dose atorvastatin.  We will continue atorvastatin 10 mg daily.  Phillip [name] should have routine labs, including lipid panel, as part of his upcoming physical with Dr. Lorelei Pont.  Follow-up: Return to clinic in 6 months.  Nelva Bush, MD 07/20/2017 10:00 AM

## 2017-07-21 DIAGNOSIS — H43813 Vitreous degeneration, bilateral: Secondary | ICD-10-CM | POA: Diagnosis not present

## 2017-07-21 DIAGNOSIS — H35371 Puckering of macula, right eye: Secondary | ICD-10-CM | POA: Diagnosis not present

## 2017-07-22 ENCOUNTER — Telehealth: Payer: Self-pay | Admitting: Internal Medicine

## 2017-07-22 NOTE — Telephone Encounter (Signed)
S/w patient. He went to eye doctor yesterday and it was determined patient had a minor retinal bleed, not a detached retina. Doctor feels it will resolve on it own. Patient has f/u in 2 weeks. Eye doctor advised patient to make [place] aware. He does not think it was caused by the [place]. Advised patient I will make Dr End aware and let him know if there are further recommendations.

## 2017-07-22 NOTE — Telephone Encounter (Signed)
Pt calling stating day before yesterday he had a retinal bleed, it was minor and is going back to eye doctor in about two weeks.   He is on blood thinner and was advised to call us.  Eye Doctor doesn't think it was related but wanted [place] to know  It was a small tear he states   If we need to talk to them we can call  Dr Satira Mccallum [phone]

## 2017-07-24 NOTE — Telephone Encounter (Signed)
Thank you for the update. If his ophthalmologist does not believe that ticagrelor +/- aspirin is a problem, we will continue with current therapy. Given that he is >1 year out from PCI, we could Edell Mesenbrink Twilight study prematurely and switch to low-dose aspirin alone if felt to be beneficial by ophthalmology.  Nelva Bush, MD [facility] Pager: [phone]

## 2017-07-27 NOTE — Telephone Encounter (Signed)
Called and s/w patient. He verbalized understanding of plan of care. He sees ophthalmology next week and will discuss with doctor at that time and update [place] if needed. Patient also needed to reschedule treadmill stress test. Transferred to scheduler to do so.

## 2017-07-28 ENCOUNTER — Telehealth: Payer: Self-pay | Admitting: *Deleted

## 2017-07-28 NOTE — Telephone Encounter (Signed)
Received call for patient today stating he has been diagnosed with a retinal bleed due to a minor retinal tear. This event started with symptom of "floaters"  on 07/21/17. He was seen by Dr. Noel Journey at [facility]. Patient was informing research office and questioned the [place]. I informed patient that he needed to comply with what ever his physicians prescribes. Sessation or stopping Brilinta and Asa/placebo was not an issue. He is 1 year post DES  implant and his last visit to stop  Research provided medication is 09/07/16. As of today he remains on drug. Questions encouraged and answered. For any questions do not hesitate to call the research office [phone].

## 2017-07-29 ENCOUNTER — Telehealth: Payer: Self-pay | Admitting: *Deleted

## 2017-07-29 NOTE — Telephone Encounter (Signed)
Called patient to remind him of his GXT scheduled for Monday 08/01/17. Arrive at 8:45 am for check in. Instructions given: - light breakfast - all medications as normal - comfortable cloths/ tennis shoes/ non-skid shoes - no decaf/ caffeine/ smoking for 24 hours prior  The patient verbalizes understanding.

## 2017-08-01 ENCOUNTER — Ambulatory Visit (INDEPENDENT_AMBULATORY_CARE_PROVIDER_SITE_OTHER): Payer: Medicare Other

## 2017-08-01 DIAGNOSIS — I2510 Atherosclerotic heart disease of native coronary artery without angina pectoris: Secondary | ICD-10-CM

## 2017-08-01 DIAGNOSIS — R0789 Other chest pain: Secondary | ICD-10-CM | POA: Diagnosis not present

## 2017-08-02 LAB — EXERCISE TOLERANCE TEST
CHL CUP MPHR: 154 {beats}/min
CHL CUP RESTING HR STRESS: 67 {beats}/min
CSEPHR: 88 %
Estimated workload: 8.8 METS
Exercise duration (min): 7 min
Exercise duration (sec): 11 s
Peak HR: 136 {beats}/min

## 2017-08-04 DIAGNOSIS — H35371 Puckering of macula, right eye: Secondary | ICD-10-CM | POA: Diagnosis not present

## 2017-08-04 DIAGNOSIS — H35412 Lattice degeneration of retina, left eye: Secondary | ICD-10-CM | POA: Diagnosis not present

## 2017-08-04 DIAGNOSIS — H43813 Vitreous degeneration, bilateral: Secondary | ICD-10-CM | POA: Diagnosis not present

## 2017-09-05 DIAGNOSIS — H35371 Puckering of macula, right eye: Secondary | ICD-10-CM | POA: Diagnosis not present

## 2017-09-05 DIAGNOSIS — H43821 Vitreomacular adhesion, right eye: Secondary | ICD-10-CM | POA: Diagnosis not present

## 2017-09-05 DIAGNOSIS — H35412 Lattice degeneration of retina, left eye: Secondary | ICD-10-CM | POA: Diagnosis not present

## 2017-09-05 DIAGNOSIS — H43813 Vitreous degeneration, bilateral: Secondary | ICD-10-CM | POA: Diagnosis not present

## 2017-09-24 IMAGING — US US ABDOMEN LIMITED
1 series · 14 of 25 positions shown · non-contrast
Comparison: Abdominal and pelvic CT scan April 20, 2012

CLINICAL DATA: Six days of chest pain.  Known gallstones.

EXAM:
US ABDOMEN LIMITED - RIGHT UPPER QUADRANT

[Series 1: us abdomen limited · 0.26mm/px · 14 of 38 slices shown]
[im 1/38]
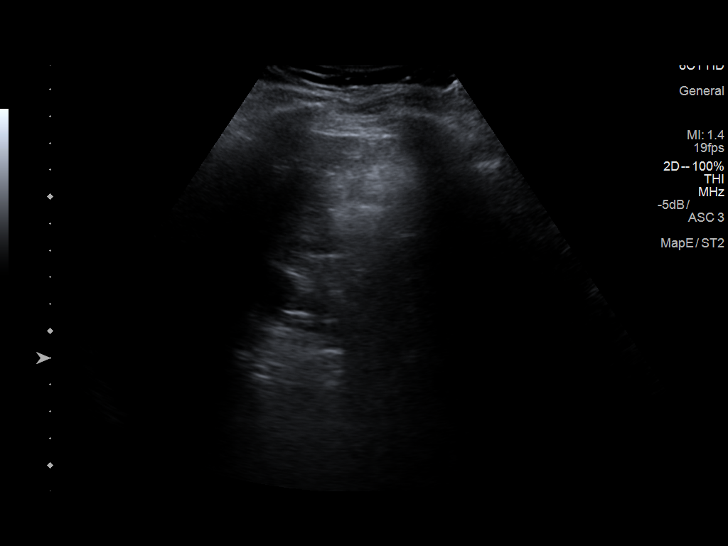
[im 4/38]
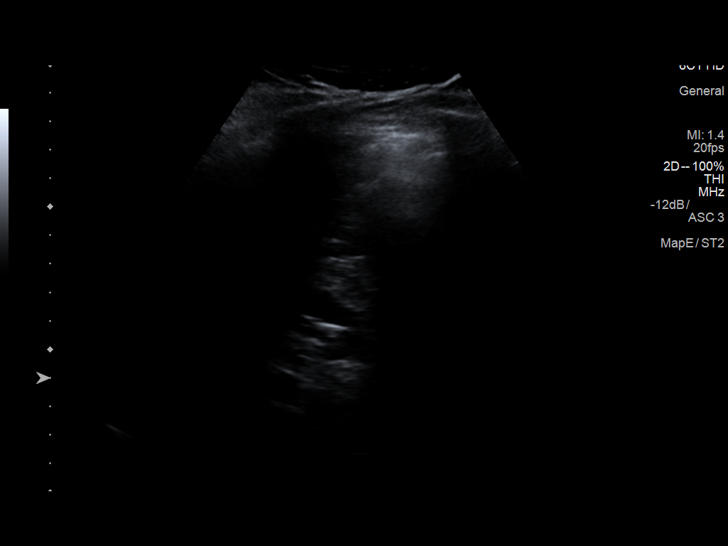
[im 7/38]
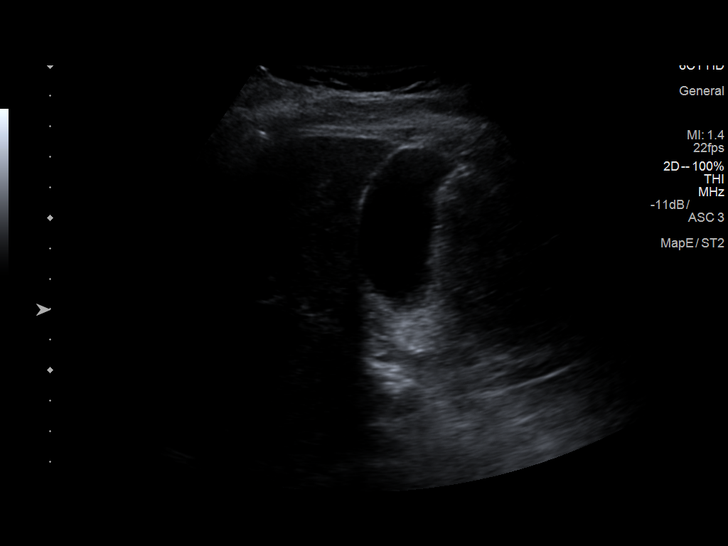
[im 10/38]
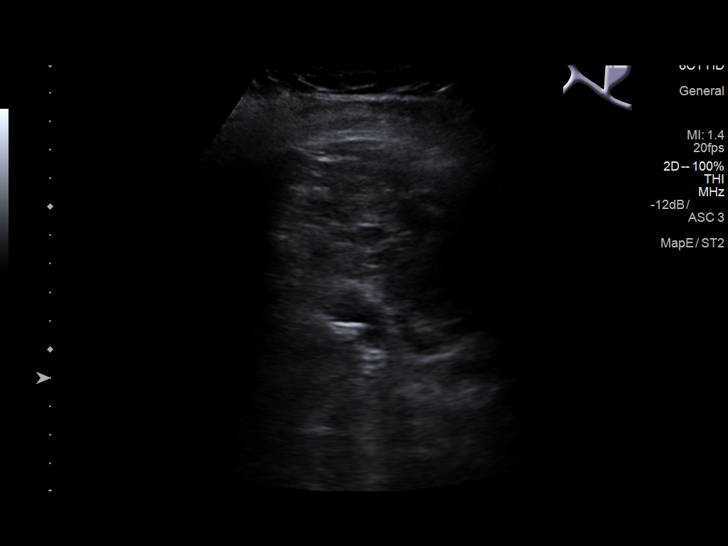
[im 13/38]
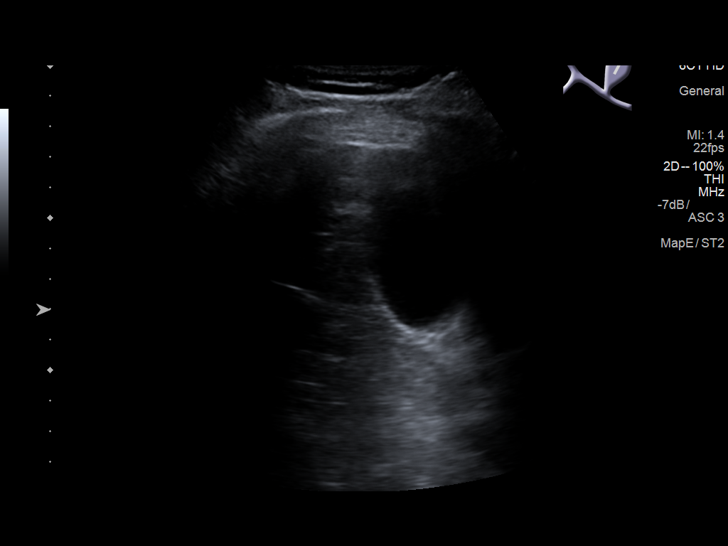
[im 14/38]
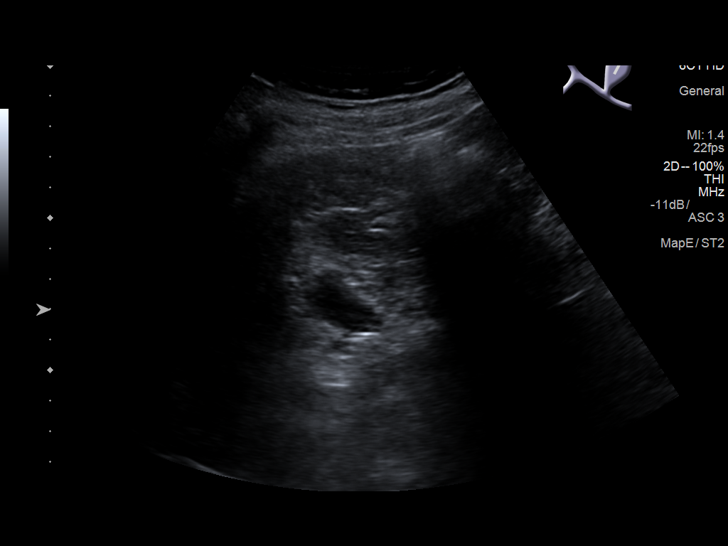
[im 17/38]
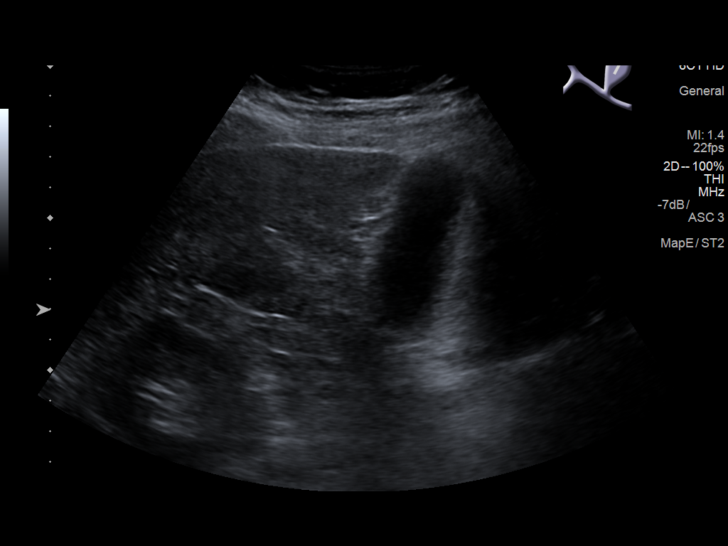
[im 21/38]
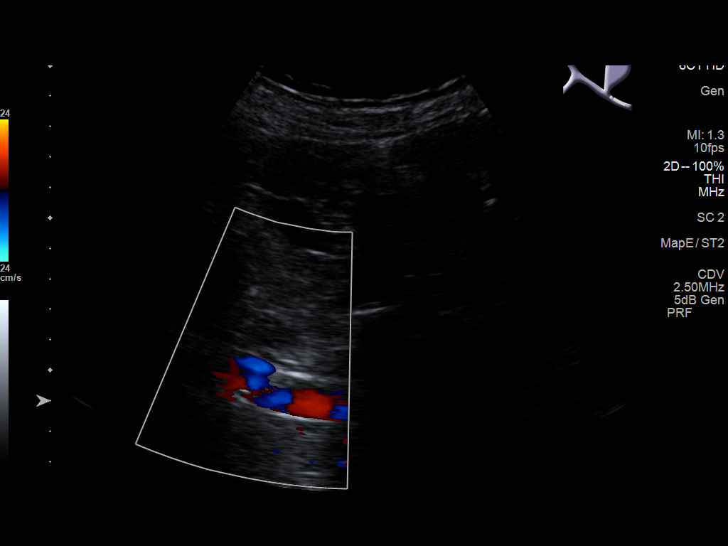
[im 24/38]
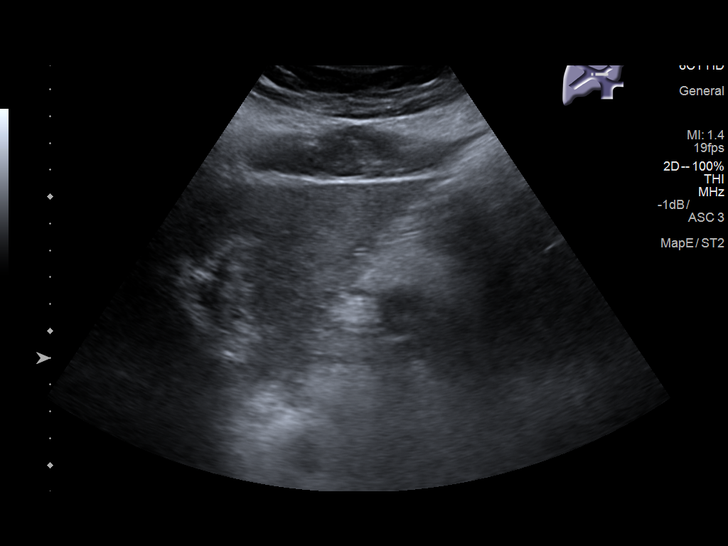
[im 25/38]
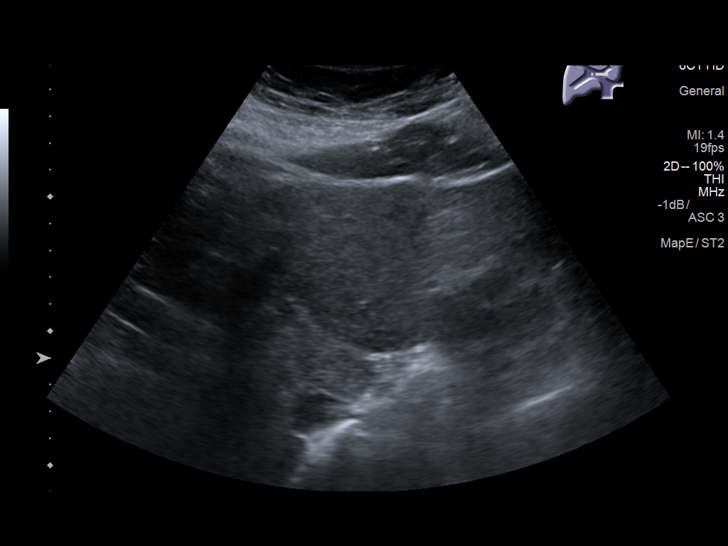
[im 28/38]
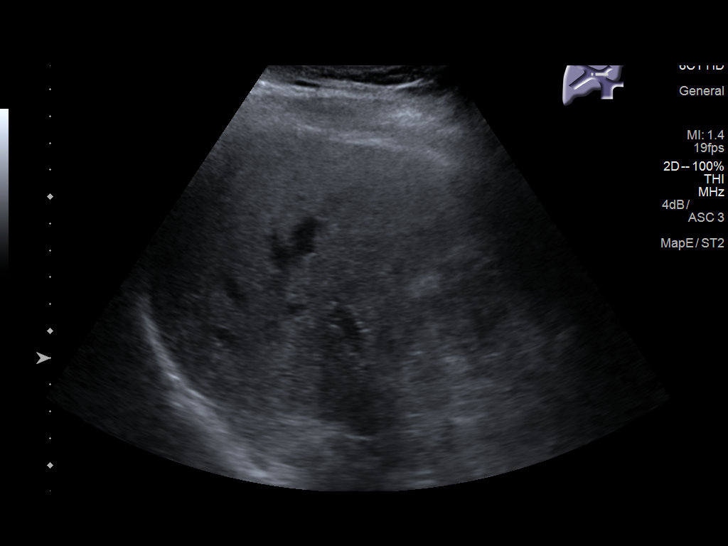
[im 31/38]
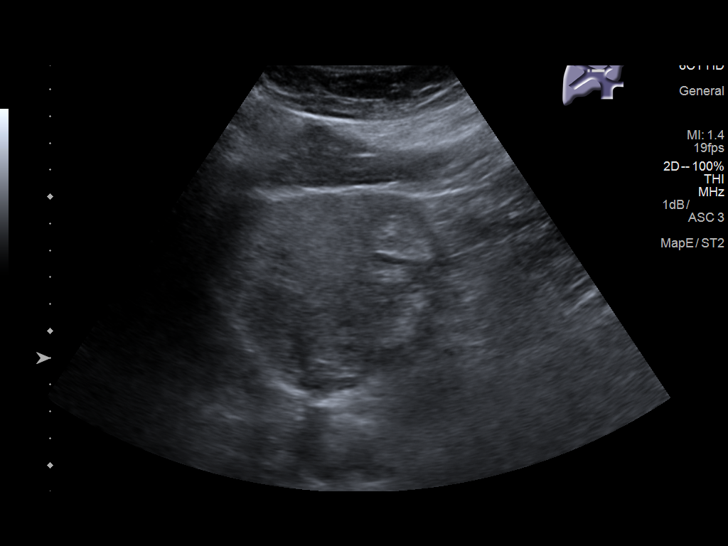
[im 34/38]
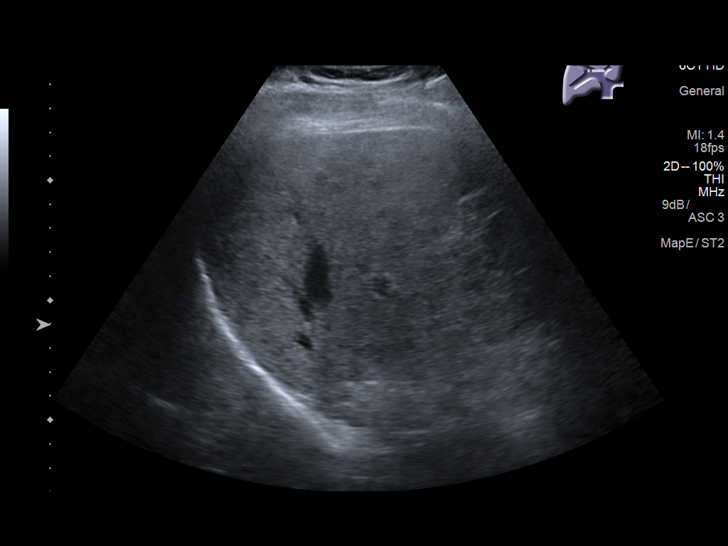
[im 38/38]
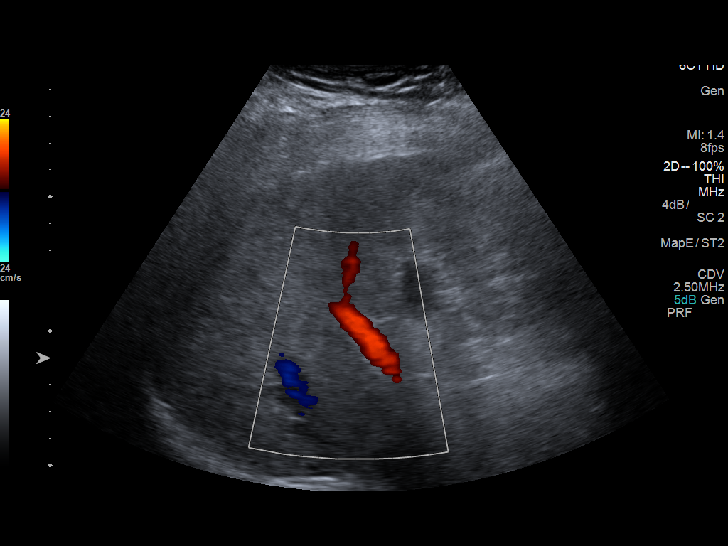

[14 of 25 positions shown; findings below may reference images not displayed]

FINDINGS: Gallbladder:

The gallbladder is adequately distended. An 8 mm stone is observed
near the gallbladder neck. There is no gallbladder wall thickening,
pericholecystic fluid, or positive sonographic Murphy's sign.

Common bile duct:

Diameter: 2.5 mm

Liver:

The hepatic echotexture is heterogeneously increased. There is no
focal mass nor ductal dilation. The surface contour of the liver is
normal.
IMPRESSION: Gallstones without sonographic evidence of acute cholecystitis.

Increased hepatic echotexture most compatible with fatty
infiltrative change.

## 2017-11-01 ENCOUNTER — Encounter: Payer: Medicare Other | Admitting: *Deleted

## 2017-11-01 DIAGNOSIS — Z006 Encounter for examination for normal comparison and control in clinical research program: Secondary | ICD-10-CM

## 2017-11-01 MED ORDER — ASPIRIN EC 81 MG PO TBEC: 81.0000 mg | DELAYED_RELEASE_TABLET | Freq: Every day | ORAL | 3 refills | Status: AC

## 2017-11-01 NOTE — Progress Notes (Signed)
TWILIGHT Research study month 15 follow up visit completed. Patient has not had any more issues since the retinal bleed. He never had to interrupt study drug or Brilinta. After pill count patient has been 93% compliant with ASA/Placebo and 98 % compliant with Brilinta. He has been ordered to start ASA 81 mg daily post TWILIGHT study. He will start open label tomorrow due to the fact he took study drug this morning. I thanked him for his participation in the study and he thanked me for the free medication. I informed patient that he has 1 more telephone follow up due between 12/22/17-01/19/18. Patient verbalized understanding.

## 2017-11-17 ENCOUNTER — Telehealth: Payer: Self-pay

## 2017-11-17 DIAGNOSIS — N183 Chronic kidney disease, stage 3 unspecified: Secondary | ICD-10-CM

## 2017-11-17 DIAGNOSIS — Z125 Encounter for screening for malignant neoplasm of prostate: Secondary | ICD-10-CM

## 2017-11-17 DIAGNOSIS — R7309 Other abnormal glucose: Secondary | ICD-10-CM

## 2017-11-17 DIAGNOSIS — E785 Hyperlipidemia, unspecified: Secondary | ICD-10-CM

## 2017-11-17 DIAGNOSIS — I2510 Atherosclerotic heart disease of native coronary artery without angina pectoris: Secondary | ICD-10-CM

## 2017-11-17 NOTE — Telephone Encounter (Signed)
Ordering labs for CPE. Routing to PCP for approval.

## 2017-11-18 ENCOUNTER — Other Ambulatory Visit: Payer: Self-pay

## 2017-11-18 ENCOUNTER — Ambulatory Visit (INDEPENDENT_AMBULATORY_CARE_PROVIDER_SITE_OTHER): Payer: Medicare Other

## 2017-11-18 VITALS — BP 84/60 | HR 58 | Temp 98.6°F | Ht 72.5 in | Wt 205.5 lb

## 2017-11-18 DIAGNOSIS — Z125 Encounter for screening for malignant neoplasm of prostate: Secondary | ICD-10-CM

## 2017-11-18 DIAGNOSIS — I2510 Atherosclerotic heart disease of native coronary artery without angina pectoris: Secondary | ICD-10-CM | POA: Diagnosis not present

## 2017-11-18 DIAGNOSIS — Z1159 Encounter for screening for other viral diseases: Secondary | ICD-10-CM

## 2017-11-18 DIAGNOSIS — R7309 Other abnormal glucose: Secondary | ICD-10-CM

## 2017-11-18 DIAGNOSIS — Z23 Encounter for immunization: Secondary | ICD-10-CM | POA: Diagnosis not present

## 2017-11-18 DIAGNOSIS — E785 Hyperlipidemia, unspecified: Secondary | ICD-10-CM

## 2017-11-18 DIAGNOSIS — N183 Chronic kidney disease, stage 3 unspecified: Secondary | ICD-10-CM

## 2017-11-18 DIAGNOSIS — Z0000 Encounter for general adult medical examination without abnormal findings: Secondary | ICD-10-CM

## 2017-11-18 LAB — LIPID PANEL
CHOL/HDL RATIO: 3
Cholesterol: 82 mg/dL (ref 0–200)
HDL: 27.6 mg/dL — AB (ref >39.00)
LDL Cholesterol: 40 mg/dL (ref 0–99)
NonHDL: 54.7
TRIGLYCERIDES: 72 mg/dL (ref 0.0–149.0)
VLDL: 14.4 mg/dL (ref 0.0–40.0)

## 2017-11-18 LAB — HEPATIC FUNCTION PANEL
ALBUMIN: 4 g/dL (ref 3.5–5.2)
ALT: 16 U/L (ref 0–53)
AST: 20 U/L (ref 0–37)
Alkaline Phosphatase: 97 U/L (ref 39–117)
Bilirubin, Direct: 0.2 mg/dL (ref 0.0–0.3)
TOTAL PROTEIN: 6.7 g/dL (ref 6.0–8.3)
Total Bilirubin: 0.8 mg/dL (ref 0.2–1.2)

## 2017-11-18 LAB — CBC WITH DIFFERENTIAL/PLATELET
BASOS ABS: 0 10*3/uL (ref 0.0–0.1)
Basophils Relative: 1.1 % (ref 0.0–3.0)
Eosinophils Absolute: 0.1 10*3/uL (ref 0.0–0.7)
Eosinophils Relative: 1.8 % (ref 0.0–5.0)
HEMATOCRIT: 42.9 % (ref 39.0–52.0)
Hemoglobin: 14.5 g/dL (ref 13.0–17.0)
LYMPHS PCT: 35.2 % (ref 12.0–46.0)
Lymphs Abs: 1.3 10*3/uL (ref 0.7–4.0)
MCHC: 33.7 g/dL (ref 30.0–36.0)
MCV: 87.2 fl (ref 78.0–100.0)
MONOS PCT: 9.3 % (ref 3.0–12.0)
Monocytes Absolute: 0.3 10*3/uL (ref 0.1–1.0)
NEUTROS ABS: 1.9 10*3/uL (ref 1.4–7.7)
Neutrophils Relative %: 52.6 % (ref 43.0–77.0)
Platelets: 111 10*3/uL — ABNORMAL LOW (ref 150.0–400.0)
RBC: 4.92 Mil/uL (ref 4.22–5.81)
RDW: 16.8 % — ABNORMAL HIGH (ref 11.5–15.5)
WBC: 3.6 10*3/uL — ABNORMAL LOW (ref 4.0–10.5)

## 2017-11-18 LAB — BASIC METABOLIC PANEL
BUN: 15 mg/dL (ref 6–23)
CHLORIDE: 105 meq/L (ref 96–112)
CO2: 29 mEq/L (ref 19–32)
Calcium: 9.3 mg/dL (ref 8.4–10.5)
Creatinine, Ser: 1.18 mg/dL (ref 0.40–1.50)
GFR: 65.47 mL/min (ref >60.00)
Glucose, Bld: 95 mg/dL (ref 70–99)
Potassium: 4.9 mEq/L (ref 3.5–5.1)
Sodium: 140 mEq/L (ref 135–145)

## 2017-11-18 LAB — HEMOGLOBIN A1C: Hgb A1c MFr Bld: 5.5 % (ref 4.6–6.5)

## 2017-11-18 LAB — PSA, MEDICARE: PSA: 0.64 ng/mL (ref 0.10–4.00)

## 2017-11-18 MED ORDER — ATORVASTATIN CALCIUM 10 MG PO TABS: 10.0000 mg | ORAL_TABLET | Freq: Every day | ORAL | 3 refills | Status: DC

## 2017-11-18 NOTE — Progress Notes (Signed)
Subjective:   Phillip [name] is a 67 y.o. male who presents for an Initial Medicare Annual Wellness Visit.  Review of Systems  N/A Cardiac Risk Factors include: advanced age (>70men, >12 women);male gender;dyslipidemia    Objective:    Today's Vitals   11/18/17 0807  BP: (!) 84/60  Pulse: (!) 58  Temp: 98.6 F (37 C)  TempSrc: Oral  SpO2: 99%  Weight: 205 lb 8 oz (93.2 kg)  Height: 6' 0.5" (1.842 m)  PainSc: 0-No pain   Body mass index is 27.49 kg/m.  Advanced Directives 11/18/2017 08/02/2016 07/13/2016 07/12/2016  Does Patient Have a Medical Advance Directive? No No No No  Would patient like information on creating a medical advance directive? Yes (MAU/Ambulatory/Procedural Areas - Information given) Yes (MAU/Ambulatory/Procedural Areas - Information given) No - Patient declined No - Patient declined    Current Medications (verified) Outpatient Encounter Medications as of 11/18/2017  Medication Sig  . Ascorbic Acid (VITAMIN C PO) Take 1 tablet by mouth daily.  Marland Kitchen aspirin EC 81 MG tablet Take 1 tablet (81 mg total) by mouth daily.  Marland Kitchen atorvastatin (LIPITOR) 10 MG tablet Take 1 tablet (10 mg total) by mouth daily.  . folic acid (FOLVITE) 696 MCG tablet Take 400 mcg by mouth daily.  . Multiple Vitamins-Minerals (ZINC PO) Take 1 tablet by mouth daily.  . nitroGLYCERIN (NITROSTAT) 0.4 MG SL tablet PLACE 1 TABLET UNDER THE TONGUE EVERY 5 MINUTES AS NEEDED FOR CHEST PAIN. MAX 3 DOSES  . omeprazole (PRILOSEC) 40 MG capsule Take 40 mg by mouth daily.   . saw palmetto 80 MG capsule Take 80 mg by mouth daily.   . vitamin B-12 500 MCG tablet Take 1 tablet (500 mcg total) by mouth daily.   No facility-administered encounter medications on file as of 11/18/2017.     Allergies (verified) Penicillins   History: Past Medical History:  Diagnosis Date  . Allergic rhinitis   . Arthritis   . CAD (coronary artery disease)    a. 06/2016 Echo: EF 55-60%, no rwma, mild AI, triv MR/TR/PR,  lipomatous hypertrophy of atrial septum;  b. 06/2016 Ex MV: EF 56%, large, partially reversible anteroseptal, inferoseptal, inferior,and apical perfusion defect - borderline for ischemia (TID 1.64) - equivocal study;  c. 06/2016 PCI: LM nl, LAD 25p/16m (3.0x24 Promus Premier MR DES), LCX min irregs, RCA nl.  . Colon polyps    a. Severe dysplasia in a right-sided polyp removed surgically in [place], 2006--> final pathology showed this was not a cancer.  Follow up colonoscopy at one year interval was normal.  . Erectile dysfunction   . GERD (gastroesophageal reflux disease)    H/o Barrett's esophagus, orginally diagnosed in 2006, biopsies showed no dysplasia; follow up EGD also showed no dysplasia  . History of BPH   . Lyme disease    x2   Past Surgical History:  Procedure Laterality Date  . CARDIAC CATHETERIZATION N/A 07/14/2016   Procedure: Left Heart Cath and Coronary Angiography;  Surgeon: Sherren Mocha, MD;  Location: [facility];  Service: Cardiovascular;  Laterality: N/A;  . CARDIAC CATHETERIZATION N/A 07/14/2016   Procedure: Coronary Stent Intervention;  Surgeon: Sherren Mocha, MD;  Location: [facility];  Service: Cardiovascular;  Laterality: N/A;  . COLONOSCOPY  12/2008   Repeat 5 years  . ESOPHAGOGASTRODUODENOSCOPY  12/2008   Barrett's, repeat 3 years  . HEMICOLECTOMY  2006   Right, for severely dysplastic right colon polyp  . HERNIA REPAIR  2008  UMBILICAL  . LUMBAR DISC SURGERY  1992   L4-5  . TONSILLECTOMY     As a child  . TUMOR REMOVAL  2002   Fatty tumor (neck)  . UPPER GASTROINTESTINAL ENDOSCOPY    . VARICOSE VEIN SURGERY  1997   Removal, right   Family History  Problem Relation Age of Onset  . Hypertension Father   . Diabetes Father   . Heart disease Father   . Lymphoma Mother   . Alcohol abuse Paternal Grandfather    Social History   Socioeconomic History  . Marital status: Married    Spouse name: Not on file  . Number of children:  3  . Years of education: Not on file  . Highest education level: Not on file  Occupational History  . Occupation: Theme park manager, Personnel officer    Employer: [employer]  Social Needs  . Financial resource strain: Not on file  . Food insecurity:    Worry: Not on file    Inability: Not on file  . Transportation needs:    Medical: Not on file    Non-medical: Not on file  Tobacco Use  . Smoking status: Never Smoker  . Smokeless tobacco: Never Used  Substance and Sexual Activity  . Alcohol use: No  . Drug use: No  . Sexual activity: Not on file  Lifestyle  . Physical activity:    Days per week: Not on file    Minutes per session: Not on file  . Stress: Not on file  Relationships  . Social connections:    Talks on phone: Not on file    Gets together: Not on file    Attends religious service: Not on file    Active member of club or organization: Not on file    Attends meetings of clubs or organizations: Not on file    Relationship status: Not on file  Other Topics Concern  . Not on file  Social History Narrative   Lives in [place] with his wife.  Tax inspector.   Tobacco Counseling Counseling given: No   Clinical Intake:  Pre-visit preparation completed: Yes  Pain : No/denies pain Pain Score: 0-No pain     Nutritional Status: BMI 25 -29 Overweight Nutritional Risks: None Diabetes: No  How often do you need to have someone help you when you read instructions, pamphlets, or other written materials from your doctor or pharmacy?: 1 - Never What is the last grade level you completed in school?: PhD  Interpreter Needed?: No  Comments: pt lives with spouse Information entered by :: LPinson, LPN  Activities of Daily Living In your present state of health, do you have any difficulty performing the following activities: 11/18/2017  Hearing? N  Vision? N  Difficulty concentrating or making decisions? N  Walking or climbing stairs? N  Dressing or  bathing? N  Doing errands, shopping? N  Preparing Food and eating ? N  Using the Toilet? N  In the past six months, have you accidently leaked urine? N  Do you have problems with loss of bowel control? N  Managing your Medications? N  Managing your Finances? N  Housekeeping or managing your Housekeeping? N  Some recent data might be hidden     Immunizations and Health Maintenance Immunization History  Administered Date(s) Administered  . H1N1 09/26/2008  . Influenza Whole 09/26/2008  . Influenza,inj,Quad PF,6+ Mos 05/30/2014, 06/07/2015, 05/10/2016  . Pneumococcal Conjugate-13 07/22/2016  . Pneumococcal Polysaccharide-23 08/17/2007, 11/18/2017  .  Td 12/16/2008   There are no preventive care reminders to display for this patient.  Patient Care Team: Owens Loffler, MD as PCP - General  Indicate any recent Medical Services you may have received from other than [facility] providers in the past year (date may be approximate).    Assessment:   This is a routine wellness examination for Carley.  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 Increase physical activity     Starting 11/18/2017, I will continue to exercise at gym for 90 minutes once weekly and to remain a Social research officer, government.       Depression Screen PHQ 2/9  Scores 11/18/2017 08/02/2016  PHQ - 2 Score 0 0  PHQ- 9 Score 0 1    Fall Risk Fall Risk  11/18/2017 08/02/2016  Falls in the past year? No No    Cognitive Function: MMSE - Mini Mental State Exam 11/18/2017  Orientation to time 5  Orientation to Place 5  Registration 3  Attention/ Calculation 0  Recall 3  Language- name 2 objects 0  Language- repeat 1  Language- follow 3 step command 3  Language- read & follow direction 0  Write a sentence 0  Copy design 0  Total score 20     PLEASE NOTE: A Mini-Cog screen was completed. Maximum score is 20. A value of 0 denotes this part of Folstein MMSE was not completed or the patient failed this part of the Mini-Cog screening.   Mini-Cog Screening Orientation to Time - Max 5 pts Orientation to Place - Max 5 pts Registration - Max 3 pts Recall - Max 3 pts Language Repeat - Max 1 pts Language Follow 3 Step Command - Max 3 pts     Screening Tests Health Maintenance  Topic Date Due  . INFLUENZA VACCINE  03/16/2018  . TETANUS/TDAP  12/17/2018  . COLONOSCOPY  07/24/2019  . Hepatitis C Screening  Completed  . PNA vac Low Risk Adult  Completed         Plan:     I have personally reviewed, addressed, and noted the following in the patient's chart:  A. Medical and social history B. Use of alcohol, tobacco or illicit drugs  C. Current medications and supplements D. Functional ability and status E.  Nutritional status F.  Physical activity G. Advance directives H. List of other physicians I.  Hospitalizations, surgeries, and ER visits in previous 12 months J.  [place] to include hearing, vision, cognitive, depression L. Referrals and appointments - none  In addition, I have reviewed and discussed with patient certain preventive protocols, quality metrics, and best practice recommendations. A written personalized care plan for preventive services as well as general preventive health recommendations were provided to  patient.  See attached scanned questionnaire for additional information.   Signed,  Lindell Noe, MHA, BS, LPN Health Coach

## 2017-11-18 NOTE — Progress Notes (Signed)
PCP notes:   Health maintenance:  Hep C screening - completed PPSV23 - administered  Abnormal screenings:   None  Patient concerns:   Pt requested refill of Atorvastatin 10 mg. Refill request sent to cardiologist.   Nurse concerns:  BP is abnormal. 84/60. Pt reports hx of low BP readings. Denies dizziness, nausea, or blurred vision.  Remained alert and oriented throughout encounter.   Next PCP appt:   11/21/17 @ 1040

## 2017-11-18 NOTE — Progress Notes (Signed)
I reviewed health advisor's note, was available for consultation, and agree with documentation and plan.  

## 2017-11-18 NOTE — Patient Instructions (Signed)
Phillip [name] , Thank you for taking time to come for your Medicare Wellness Visit. I appreciate your ongoing commitment to your health goals. Please review the following plan we discussed and let me know if I can assist you in the future.   These are the goals we discussed: Goals    . Increase physical activity     Starting 11/18/2017, I will continue to exercise at gym for 90 minutes once weekly and to remain a Social research officer, government.        This is a list of the screening recommended for you and due dates:  Health Maintenance  Topic Date Due  . Flu Shot  03/16/2018  . Tetanus Vaccine  12/17/2018  . Colon Cancer Screening  07/24/2019  .  Hepatitis C: One time screening is recommended by Center for Disease Control  (CDC) for  adults born from [birth year] through 1965.   Completed  . Pneumonia vaccines  Completed   Preventive Care for Adults  A healthy lifestyle and preventive care can promote health and wellness. Preventive health guidelines for adults include the following key practices.  . A routine yearly physical is a good way to check with your health care provider about your health and preventive screening. It is a chance to share any concerns and updates on your health and to receive a thorough exam.  . Visit your dentist for a routine exam and preventive care every 6 months. Brush your teeth twice a day and floss once a day. Good oral hygiene prevents tooth decay and gum disease.  . The frequency of eye exams is based on your age, health, family medical history, use  of contact lenses, and other factors. Follow your health care provider's recommendations for frequency of eye exams.  . Eat a healthy diet. Foods like vegetables, fruits, whole grains, low-fat dairy products, and lean protein foods contain the nutrients you need without too many calories. Decrease your intake of foods high in solid fats, added sugars, and salt. Eat the right amount of calories for you. Get information about a  proper diet from your health care provider, if necessary.  . Regular physical exercise is one of the most important things you can do for your health. Most adults should get at least 150 minutes of moderate-intensity exercise (any activity that increases your heart rate and causes you to sweat) each week. In addition, most adults need muscle-strengthening exercises on 2 or more days a week.  Silver Sneakers may be a benefit available to you. To determine eligibility, you may visit the website: www.silversneakers.com or contact program at [phone] Mon-Fri between 8AM-8PM.   . Maintain a healthy weight. The body mass index (BMI) is a screening tool to identify possible weight problems. It provides an estimate of body fat based on height and weight. Your health care provider can find your BMI and can help you achieve or maintain a healthy weight.   For adults 20 years and older: ? A BMI below 18.5 is considered underweight. ? A BMI of 18.5 to 24.9 is normal. ? A BMI of 25 to 29.9 is considered overweight. ? A BMI of 30 and above is considered obese.   . Maintain normal blood lipids and cholesterol levels by exercising and minimizing your intake of saturated fat. Eat a balanced diet with plenty of fruit and vegetables. Blood tests for lipids and cholesterol should begin at age 31 and be repeated every 5 years. If your lipid or cholesterol levels are high,  you are over 50, or you are at high risk for heart disease, you may need your cholesterol levels checked more frequently. Ongoing high lipid and cholesterol levels should be treated with medicines if diet and exercise are not working.  . If you smoke, find out from your health care provider how to quit. If you do not use tobacco, please do not start.  . If you choose to drink alcohol, please do not consume more than 2 drinks per day. One drink is considered to be 12 ounces (355 mL) of beer, 5 ounces (148 mL) of wine, or 1.5 ounces (44 mL) of  liquor.  . If you are 82-60 years old, ask your health care provider if you should take aspirin to prevent strokes.  . Use sunscreen. Apply sunscreen liberally and repeatedly throughout the day. You should seek shade when your shadow is shorter than you. Protect yourself by wearing long sleeves, pants, a wide-brimmed hat, and sunglasses year round, whenever you are outdoors.  . Once a month, do a whole body skin exam, using a mirror to look at the skin on your back. Tell your health care provider of new moles, moles that have irregular borders, moles that are larger than a pencil eraser, or moles that have changed in shape or color.

## 2017-11-18 NOTE — Telephone Encounter (Signed)
Thank you for the update. I agree with continuing atorvastatin 10 mg daily.  Nelva Bush, MD [facility] Pager: [phone]

## 2017-11-18 NOTE — Telephone Encounter (Signed)
Patient was in office today for Annual Wellness Visit and requested a refill of Atorvastatin 10 mg. Per pt, Dr. Saunders Revel was prescribing physician.   Patient has been out of medication for 5 days. If approved, please send prescription to [place], [place].   FYI: Lipid panel collected and resulted on 11/18/17.

## 2017-11-20 LAB — HEPATITIS C ANTIBODY
Hepatitis C Ab: NONREACTIVE
SIGNAL TO CUT-OFF: 0.04 (ref <1.00)

## 2017-11-21 ENCOUNTER — Encounter: Payer: Self-pay | Admitting: Family Medicine

## 2017-11-21 ENCOUNTER — Ambulatory Visit (INDEPENDENT_AMBULATORY_CARE_PROVIDER_SITE_OTHER): Payer: Medicare Other | Admitting: Family Medicine

## 2017-11-21 ENCOUNTER — Ambulatory Visit: Payer: No Typology Code available for payment source | Admitting: Family Medicine

## 2017-11-21 VITALS — BP 110/60 | HR 60 | Temp 98.4°F | Ht 72.5 in | Wt 208.5 lb

## 2017-11-21 DIAGNOSIS — F528 Other sexual dysfunction not due to a substance or known physiological condition: Secondary | ICD-10-CM

## 2017-11-21 DIAGNOSIS — E785 Hyperlipidemia, unspecified: Secondary | ICD-10-CM

## 2017-11-21 DIAGNOSIS — I2510 Atherosclerotic heart disease of native coronary artery without angina pectoris: Secondary | ICD-10-CM

## 2017-11-21 DIAGNOSIS — D696 Thrombocytopenia, unspecified: Secondary | ICD-10-CM | POA: Diagnosis not present

## 2017-11-21 DIAGNOSIS — K2270 Barrett's esophagus without dysplasia: Secondary | ICD-10-CM | POA: Diagnosis not present

## 2017-11-21 MED ORDER — SILDENAFIL CITRATE 20 MG PO TABS: ORAL_TABLET | ORAL | 11 refills | Status: DC

## 2017-11-21 NOTE — Progress Notes (Signed)
Dr. Frederico Hamman T. Adie Vilar, MD, [facility] [place], [address] Phone: [phone] Fax: [phone]  11/21/2017  Patient: Phillip [name], MRN: [id number], DOB: [birth year], 67 y.o.  Primary Physician:  Owens Loffler, MD   Chief Complaint  Patient presents with  . Annual Exam    Part 2   Subjective:   Phillip [name] is a 67 y.o. very pleasant male patient who presents with the following:  F/u general medical problems:  CAD, stable taking asa 81 and lipitor.   BP stable.   ED, needs viagra  ? Mild le edema, known varicosities  Past Medical History, Surgical History, Social History, Family History, Problem List, Medications, and Allergies have been reviewed and updated if relevant.  Patient Active Problem List   Diagnosis Date Noted  . Hyperlipidemia LDL goal <70 07/20/2017  . Coronary artery disease involving native coronary artery of native heart without angina pectoris 11/02/2016  . 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 CKD (chronic kidney disease) stage 3, GFR 30-59 ml/min (HCC) 07/13/2016  . Dyslipidemia 07/21/2015  . Thrombocytopenia ([place]) 04/19/2012  . BARRETTS ESOPHAGUS 12/16/2008  . ERECTILE DYSFUNCTION 09/26/2008  . ALLERGIC RHINITIS 09/26/2008  . GERD 09/26/2008  . COLONIC POLYPS, HX OF 09/26/2008    Past Medical History:  Diagnosis Date  . Allergic rhinitis   . Arthritis   . CAD (coronary artery disease)    a. 06/2016 Echo: EF 55-60%, no rwma, mild AI, triv MR/TR/PR, lipomatous hypertrophy of atrial septum;  b. 06/2016 Ex MV: EF 56%, large, partially reversible anteroseptal, inferoseptal, inferior,and apical perfusion defect - borderline for ischemia (TID 1.64) - equivocal study;  c. 06/2016 PCI: LM nl, LAD 25p/32m (3.0x24 Promus Premier MR DES), LCX min irregs, RCA nl.  . Colon polyps    a. Severe dysplasia in a right-sided polyp removed surgically in [place],  2006--> final pathology showed this was not a cancer.  Follow up colonoscopy at one year interval was normal.  . Erectile dysfunction   . GERD (gastroesophageal reflux disease)    H/o Barrett's esophagus, orginally diagnosed in 2006, biopsies showed no dysplasia; follow up EGD also showed no dysplasia  . History of BPH   . Lyme disease    x2    Past Surgical History:  Procedure Laterality Date  . CARDIAC CATHETERIZATION N/A 07/14/2016   Procedure: Left Heart Cath and Coronary Angiography;  Surgeon: Sherren Mocha, MD;  Location: [facility];  Service: Cardiovascular;  Laterality: N/A;  . CARDIAC CATHETERIZATION N/A 07/14/2016   Procedure: Coronary Stent Intervention;  Surgeon: Sherren Mocha, MD;  Location: [facility];  Service: Cardiovascular;  Laterality: N/A;  . COLONOSCOPY  12/2008   Repeat 5 years  . ESOPHAGOGASTRODUODENOSCOPY  12/2008   Barrett's, repeat 3 years  . HEMICOLECTOMY  2006   Right, for severely dysplastic right colon polyp  . HERNIA REPAIR  3557   UMBILICAL  . LUMBAR DISC SURGERY  1992   L4-5  . TONSILLECTOMY     As a child  . TUMOR REMOVAL  2002   Fatty tumor (neck)  . UPPER GASTROINTESTINAL ENDOSCOPY    . VARICOSE VEIN SURGERY  1997   Removal, right    Social History   Socioeconomic History  . Marital status: Married    Spouse name: Not on file  . Number of children: 3  . Years of education: Not on file  . Highest education  level: Not on file  Occupational History  . Occupation: Theme park manager, Personnel officer    Employer: [employer]  Social Needs  . Financial resource strain: Not on file  . Food insecurity:    Worry: Not on file    Inability: Not on file  . Transportation needs:    Medical: Not on file    Non-medical: Not on file  Tobacco Use  . Smoking status: Never Smoker  . Smokeless tobacco: Never Used  Substance and Sexual Activity  . Alcohol use: No  . Drug use: No  . Sexual activity: Not on file  Lifestyle  .  Physical activity:    Days per week: Not on file    Minutes per session: Not on file  . Stress: Not on file  Relationships  . Social connections:    Talks on phone: Not on file    Gets together: Not on file    Attends religious service: Not on file    Active member of club or organization: Not on file    Attends meetings of clubs or organizations: Not on file    Relationship status: Not on file  . Intimate partner violence:    Fear of current or ex partner: Not on file    Emotionally abused: Not on file    Physically abused: Not on file    Forced sexual activity: Not on file  Other Topics Concern  . 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    Family History  Problem Relation Age of Onset  . Hypertension Father   . Diabetes Father   . Heart disease Father   . Lymphoma Mother   . Alcohol abuse Paternal Grandfather     Allergies  Allergen Reactions  . Penicillins     REACTION: Rash as a child - Tolerated AMOXICILLIN SEVERAL TIMES AS ADULT RECENTLY Has patient had a PCN reaction causing immediate rash, facial/tongue/throat swelling, SOB or lightheadedness with hypotension: YES Has patient had a PCN reaction causing severe rash involving mucus membranes or skin necrosis:NO Has patient had a PCN reaction that required hospitalization NO Has patient had a PCN reaction occurring within the last 10 years: NO If all of the above answers are "NO", then may proceed with     Medication list reviewed and updated in full in [place].   GEN: No acute illnesses, no fevers, chills. GI: No n/v/d, eating normally Pulm: No SOB Interactive and getting along well at home.  Otherwise, ROS is as per the HPI.  Objective:   BP 110/60   Pulse 60   Temp 98.4 F (36.9 C) (Oral)   Ht 6' 0.5" (1.842 m)   Wt 208 lb 8 oz (94.6 kg)   BMI 27.89 kg/m   GEN: WDWN, NAD, Non-toxic, A & O x 3 HEENT: Atraumatic,  Normocephalic. Neck supple. No masses, No LAD. Ears and Nose: No external deformity. CV: RRR, No M/G/R. No JVD. No thrill. No extra heart sounds. PULM: CTA B, no wheezes, crackles, rhonchi. No retractions. No resp. distress. No accessory muscle use. EXTR: No c/c/e NEURO Normal gait.  PSYCH: Normally interactive. Conversant. Not depressed or anxious appearing.  Calm demeanor.  GU: rectal normal externally Multiple seb k's diffuse  Laboratory and Imaging Data: Results for orders placed or performed in visit on 11/18/17  Hepatitis C antibody  Result Value Ref Range   Hepatitis C Ab NON-REACTIVE NON-REACTI  SIGNAL TO CUT-OFF 0.04 <1.00  CBC with Differential/Platelet  Result Value Ref Range   WBC 3.6 (L) 4.0 - 10.5 K/uL   RBC 4.92 4.22 - 5.81 Mil/uL   Hemoglobin 14.5 13.0 - 17.0 g/dL   HCT 42.9 39.0 - 52.0 %   MCV 87.2 78.0 - 100.0 fl   MCHC 33.7 30.0 - 36.0 g/dL   RDW 16.8 (H) 11.5 - 15.5 %   Platelets 111.0 (L) 150.0 - 400.0 K/uL   Neutrophils Relative % 52.6 43.0 - 77.0 %   Lymphocytes Relative 35.2 12.0 - 46.0 %   Monocytes Relative 9.3 3.0 - 12.0 %   Eosinophils Relative 1.8 0.0 - 5.0 %   Basophils Relative 1.1 0.0 - 3.0 %   Neutro Abs 1.9 1.4 - 7.7 K/uL   Lymphs Abs 1.3 0.7 - 4.0 K/uL   Monocytes Absolute 0.3 0.1 - 1.0 K/uL   Eosinophils Absolute 0.1 0.0 - 0.7 K/uL   Basophils Absolute 0.0 0.0 - 0.1 K/uL  Hemoglobin A1c  Result Value Ref Range   Hgb A1c MFr Bld 5.5 4.6 - 6.5 %  Lipid Panel  Result Value Ref Range   Cholesterol 82 0 - 200 mg/dL   Triglycerides 72.0 0.0 - 149.0 mg/dL   HDL 27.60 (L) >39.00 mg/dL   VLDL 14.4 0.0 - 40.0 mg/dL   LDL Cholesterol 40 0 - 99 mg/dL   Total CHOL/HDL Ratio 3    NonHDL 54.70   Hepatic Function Panel  Result Value Ref Range   Total Bilirubin 0.8 0.2 - 1.2 mg/dL   Bilirubin, Direct 0.2 0.0 - 0.3 mg/dL   Alkaline Phosphatase 97 39 - 117 U/L   AST 20 0 - 37 U/L   ALT 16 0 - 53 U/L   Total Protein 6.7 6.0 - 8.3 g/dL   Albumin  4.0 3.5 - 5.2 g/dL  Basic Metabolic Panel  Result Value Ref Range   Sodium 140 135 - 145 mEq/L   Potassium 4.9 3.5 - 5.1 mEq/L   Chloride 105 96 - 112 mEq/L   CO2 29 19 - 32 mEq/L   Glucose, Bld 95 70 - 99 mg/dL   BUN 15 6 - 23 mg/dL   Creatinine, Ser 1.18 0.40 - 1.50 mg/dL   Calcium 9.3 8.4 - 10.5 mg/dL   GFR 65.47 >60.00 mL/min  PSA, Medicare ([place])  Result Value Ref Range   PSA 0.64 0.10 - 4.00 ng/ml     Assessment and Plan:   Coronary artery disease involving native coronary artery of native heart without angina pectoris  Hyperlipidemia LDL goal <70  Thrombocytopenia (HCC)  Barrett's esophagus without dysplasia  ERECTILE DYSFUNCTION  Basically doing well globally Will arrange f/u for barretts with gi Labs stable   Refill viagra  Follow-up: 1 year  Meds ordered this encounter  Medications  . sildenafil (REVATIO) 20 MG tablet    Sig: 2 - 5 tabs 30 mins prior to intercourse.    Dispense:  20 tablet    Refill:  11    Signed,  Dametrius Sanjuan T. Tiernan Suto, MD   Allergies as of 11/21/2017      Reactions   Penicillins    REACTION: Rash as a child - Tolerated AMOXICILLIN SEVERAL TIMES AS ADULT RECENTLY Has patient had a PCN reaction causing immediate rash, facial/tongue/throat swelling, SOB or lightheadedness with hypotension: YES Has patient had a PCN reaction causing severe rash involving mucus membranes or skin necrosis:NO Has patient had a PCN reaction that required hospitalization  NO Has patient had a PCN reaction occurring within the last 10 years: NO If all of the above answers are "NO", then may proceed with       Medication List        Accurate as of 11/21/17 11:46 AM. Always use your most recent med list.          aspirin EC 81 MG tablet Take 1 tablet (81 mg total) by mouth daily.   atorvastatin 10 MG tablet Commonly known as:  LIPITOR Take 1 tablet (10 mg total) by mouth daily.   cyanocobalamin 500 MCG tablet Take 1 tablet (500 mcg total) by  mouth daily.   folic acid 517 MCG tablet Commonly known as:  FOLVITE Take 400 mcg by mouth daily.   nitroGLYCERIN 0.4 MG SL tablet Commonly known as:  NITROSTAT PLACE 1 TABLET UNDER THE TONGUE EVERY 5 MINUTES AS NEEDED FOR CHEST PAIN. MAX 3 DOSES   omeprazole 40 MG capsule Commonly known as:  PRILOSEC Take 40 mg by mouth daily.   saw palmetto 80 MG capsule Take 80 mg by mouth daily.   sildenafil 20 MG tablet Commonly known as:  REVATIO 2 - 5 tabs 30 mins prior to intercourse.   VITAMIN C PO Take 1 tablet by mouth daily.   ZINC PO Take 1 tablet by mouth daily.

## 2017-11-26 DIAGNOSIS — H2513 Age-related nuclear cataract, bilateral: Secondary | ICD-10-CM | POA: Diagnosis not present

## 2017-11-26 DIAGNOSIS — H524 Presbyopia: Secondary | ICD-10-CM | POA: Diagnosis not present

## 2017-12-23 ENCOUNTER — Telehealth: Payer: Self-pay | Admitting: *Deleted

## 2017-12-23 NOTE — Telephone Encounter (Signed)
Left message for patient to call research office for last TWILIGHT Research study  follow up phone call.

## 2017-12-27 ENCOUNTER — Encounter: Payer: Self-pay | Admitting: *Deleted

## 2017-12-27 NOTE — Progress Notes (Unsigned)
TWILIGHT Research study month 18 telephone follow up completed. Patient continues on the ASA 81 mg daily and denies any adverse bleeding events. I thanked him for his participation.

## 2018-01-24 ENCOUNTER — Encounter: Payer: Self-pay | Admitting: Family Medicine

## 2018-01-24 ENCOUNTER — Ambulatory Visit (INDEPENDENT_AMBULATORY_CARE_PROVIDER_SITE_OTHER): Payer: Medicare Other | Admitting: Family Medicine

## 2018-01-24 VITALS — BP 120/74 | HR 80 | Temp 98.1°F | Ht 72.5 in | Wt 204.5 lb

## 2018-01-24 DIAGNOSIS — H6503 Acute serous otitis media, bilateral: Secondary | ICD-10-CM

## 2018-01-24 DIAGNOSIS — I2510 Atherosclerotic heart disease of native coronary artery without angina pectoris: Secondary | ICD-10-CM | POA: Diagnosis not present

## 2018-01-24 MED ORDER — FLUTICASONE PROPIONATE 50 MCG/ACT NA SUSP: 2.0000 | Freq: Every day | NASAL | 3 refills | Status: DC

## 2018-01-24 NOTE — Patient Instructions (Signed)
I think you have some chronic changes to the ear drums but also now with fluid in the ears over the last week. Treat with nasal steroid flonase 2 sprays into each nostril daily for next 2 weeks then update [place] with effect. If not improving, we will refer you to ENT.

## 2018-01-24 NOTE — Progress Notes (Signed)
BP 120/74 (BP Location: Left Arm, Patient Position: Sitting, Cuff Size: Normal)   Pulse 80   Temp 98.1 F (36.7 C) (Oral)   Ht 6' 0.5" (1.842 m)   Wt 204 lb 8 oz (92.8 kg)   SpO2 98%   BMI 27.35 kg/m    CC: ear pain Subjective:    Patient ID: Phillip [name], male    DOB: 26-May-1951, 67 y.o.   MRN: [id number]  HPI: Phillip [name] is a 67 y.o. male presenting on 01/24/2018 for Ear Pain (Minor bilateral ear discomfort, pressure and ringing, worse in left. Was sawing recently reaching above head and not sure if he got something in it. )   Last week cutting tree limbs with lots of overhead activity. That night felt ear fullness, some tinnitus. Treating with pseudaphed as well as OTC wax removal without benefit. Mild ear discomfort L>R. Some dizziness, not true vertigo. Some muffled hearing.   No fevers/chills, congestion, hearing changes. No ear drainage.   Relevant past medical, surgical, family and social history reviewed and updated as indicated. Interim medical history since our last visit reviewed. Allergies and medications reviewed and updated. Outpatient Medications Prior to Visit  Medication Sig Dispense Refill  . Ascorbic Acid (VITAMIN C PO) Take 1 tablet by mouth daily.    Marland Kitchen aspirin EC 81 MG tablet Take 1 tablet (81 mg total) by mouth daily. 90 tablet 3  . atorvastatin (LIPITOR) 10 MG tablet Take 1 tablet (10 mg total) by mouth daily. 90 tablet 3  . folic acid (FOLVITE) 326 MCG tablet Take 400 mcg by mouth daily.    . Multiple Vitamins-Minerals (ZINC PO) Take 1 tablet by mouth daily.    . nitroGLYCERIN (NITROSTAT) 0.4 MG SL tablet PLACE 1 TABLET UNDER THE TONGUE EVERY 5 MINUTES AS NEEDED FOR CHEST PAIN. MAX 3 DOSES 25 tablet 5  . omeprazole (PRILOSEC) 40 MG capsule Take 40 mg by mouth daily.     . saw palmetto 80 MG capsule Take 80 mg by mouth daily.     . sildenafil (REVATIO) 20 MG tablet 2 - 5 tabs 30 mins prior to intercourse. 20 tablet 11  . vitamin B-12 500 MCG  tablet Take 1 tablet (500 mcg total) by mouth daily. 30 tablet 0   No facility-administered medications prior to visit.      Per HPI unless specifically indicated in ROS section below Review of Systems     Objective:    BP 120/74 (BP Location: Left Arm, Patient Position: Sitting, Cuff Size: Normal)   Pulse 80   Temp 98.1 F (36.7 C) (Oral)   Ht 6' 0.5" (1.842 m)   Wt 204 lb 8 oz (92.8 kg)   SpO2 98%   BMI 27.35 kg/m   Wt Readings from Last 3 Encounters:  01/24/18 204 lb 8 oz (92.8 kg)  11/21/17 208 lb 8 oz (94.6 kg)  11/18/17 205 lb 8 oz (93.2 kg)    Physical Exam  Constitutional: He appears well-developed and well-nourished. No distress.  HENT:  Head: Normocephalic and atraumatic.  Right Ear: Hearing, external ear and ear canal normal.  Left Ear: Hearing, external ear and ear canal normal.  Nose: Nose normal. No rhinorrhea.  Mouth/Throat: Uvula is midline, oropharynx is clear and moist and mucous membranes are normal. No oropharyngeal exudate, posterior oropharyngeal edema, posterior oropharyngeal erythema or tonsillar abscesses.  Pocket bilateral mid TMs - likely chronic L TM with thicker fluid present.  Normal rinne test Weber lateralizes  to right  Eyes: Pupils are equal, round, and reactive to light. Conjunctivae and EOM are normal. No scleral icterus.  Neck: Normal range of motion. Neck supple.  Lymphadenopathy:    He has no cervical adenopathy.  Skin: Skin is warm and dry. No rash noted.  Nursing note and vitals reviewed.     Assessment & Plan:   Problem List Items Addressed This Visit    Acute serous otitis media of both ears - Primary    L>R. No obvious erythema or bacterial infection. Anticipate related to ETD. Rx flonase. Update with effect in 2 wks. If no better, would refer to ENT. Pt agrees with plan.           Meds ordered this encounter  Medications  . fluticasone (FLONASE) 50 MCG/ACT nasal spray    Sig: Place 2 sprays into both nostrils daily.     Dispense:  16 g    Refill:  3   No orders of the defined types were placed in this encounter.   Follow up plan: Return if symptoms worsen or fail to improve.  Ria Bush, MD

## 2018-01-24 NOTE — Assessment & Plan Note (Signed)
L>R. No obvious erythema or bacterial infection. Anticipate related to ETD. Rx flonase. Update with effect in 2 wks. If no better, would refer to ENT. Pt agrees with plan.

## 2018-03-07 DIAGNOSIS — T1502XA Foreign body in cornea, left eye, initial encounter: Secondary | ICD-10-CM | POA: Diagnosis not present

## 2018-03-13 DIAGNOSIS — H903 Sensorineural hearing loss, bilateral: Secondary | ICD-10-CM | POA: Diagnosis not present

## 2018-03-13 DIAGNOSIS — H9319 Tinnitus, unspecified ear: Secondary | ICD-10-CM | POA: Diagnosis not present

## 2018-03-14 DIAGNOSIS — T1502XS Foreign body in cornea, left eye, sequela: Secondary | ICD-10-CM | POA: Diagnosis not present

## 2018-04-11 ENCOUNTER — Encounter: Payer: Self-pay | Admitting: Gastroenterology

## 2018-04-11 ENCOUNTER — Ambulatory Visit (INDEPENDENT_AMBULATORY_CARE_PROVIDER_SITE_OTHER): Payer: Medicare Other | Admitting: Gastroenterology

## 2018-04-11 VITALS — BP 112/68 | HR 62 | Ht 73.0 in | Wt 210.4 lb

## 2018-04-11 DIAGNOSIS — K2270 Barrett's esophagus without dysplasia: Secondary | ICD-10-CM

## 2018-04-11 DIAGNOSIS — R194 Change in bowel habit: Secondary | ICD-10-CM | POA: Diagnosis not present

## 2018-04-11 DIAGNOSIS — I2510 Atherosclerotic heart disease of native coronary artery without angina pectoris: Secondary | ICD-10-CM

## 2018-04-11 MED ORDER — PEG 3350-KCL-NA BICARB-NACL 420 G PO SOLR
4000.0000 mL | ORAL | 0 refills | Status: DC
Start: 2018-04-11 — End: 2018-05-24

## 2018-04-11 NOTE — Progress Notes (Signed)
Review of pertinent gastrointestinal problems: 1. Elevated risk for colon cancer (HRA) Colonoscopy in 2006, a large right sided TVA with severe dysplasia was found.  this was too large to be removed endoscopically and so he underwent a right hemicolectomy.  Surgical pathology confirmed this was not a cancer, 18 nodes were sampled and all were negative.  Underwent second colonoscopy in 2007, clear anastomosis.  Repeat colonoscopy [name] 12/2008 found right hemicolectomy anastomosis, o/w normal.  Colonoscopy 07/2014 Dr. Ardis Hughs normal right hemicolectomy anastomosis, region in sigmoid with abnormal mucosa, biopsied and tattood; path showed lymphoid aggregates. 2. Barrett's short segment:  EGD 2006 no dysplasia, EGD 2010 (Tannya Gonet) same, no dysplasia, recommended recall at 3 year interval.  EGD 06/2012 Dr. Ardis Hughs, 1.5cm Barrett's mucosa, non-nodular. Path showed IM without dysplasia.  HPI: This is a very pleasant 67 year old man whom I last saw 2 to 3 years ago.  Chief complaint is change in bowel habits, history of Barrett's esophagus  Omeprazole daily helps.  No dysphagia.  Lost 20 pounds intentionally after cardiac issues.  Off brilinta now.  A bit more constipated lately.  Under a lot of stress.  No bleeding.  ROS: complete GI ROS as described in HPI, all other review negative.  Constitutional:  No unintentional weight loss   Past Medical History:  Diagnosis Date  . Allergic rhinitis   . Arthritis   . CAD (coronary artery disease)    a. 06/2016 Echo: EF 55-60%, no rwma, mild AI, triv MR/TR/PR, lipomatous hypertrophy of atrial septum;  b. 06/2016 Ex MV: EF 56%, large, partially reversible anteroseptal, inferoseptal, inferior,and apical perfusion defect - borderline for ischemia (TID 1.64) - equivocal study;  c. 06/2016 PCI: LM nl, LAD 25p/13m (3.0x24 Promus Premier MR DES), LCX min irregs, RCA nl.  . Colon polyps    a. Severe dysplasia in a right-sided polyp removed surgically in  [place], 2006--> final pathology showed this was not a cancer.  Follow up colonoscopy at one year interval was normal.  . Erectile dysfunction   . GERD (gastroesophageal reflux disease)    H/o Barrett's esophagus, orginally diagnosed in 2006, biopsies showed no dysplasia; follow up EGD also showed no dysplasia  . History of BPH   . Lyme disease    x2    Past Surgical History:  Procedure Laterality Date  . CARDIAC CATHETERIZATION N/A 07/14/2016   Procedure: Left Heart Cath and Coronary Angiography;  Surgeon: Sherren Mocha, MD;  Location: [facility];  Service: Cardiovascular;  Laterality: N/A;  . CARDIAC CATHETERIZATION N/A 07/14/2016   Procedure: Coronary Stent Intervention;  Surgeon: Sherren Mocha, MD;  Location: [facility];  Service: Cardiovascular;  Laterality: N/A;  . COLONOSCOPY  12/2008   Repeat 5 years  . ESOPHAGOGASTRODUODENOSCOPY  12/2008   Barrett's, repeat 3 years  . HEMICOLECTOMY  2006   Right, for severely dysplastic right colon polyp  . HERNIA REPAIR  9373   UMBILICAL  . LUMBAR DISC SURGERY  1992   L4-5  . TONSILLECTOMY     As a child  . TUMOR REMOVAL  2002   Fatty tumor (neck)  . UPPER GASTROINTESTINAL ENDOSCOPY    . VARICOSE VEIN SURGERY  1997   Removal, right    Current Outpatient Medications  Medication Sig Dispense Refill  . Ascorbic Acid (VITAMIN C PO) Take 1 tablet by mouth daily.    Marland Kitchen aspirin EC 81 MG tablet Take 1 tablet (81 mg total) by mouth daily. 90 tablet 3  . atorvastatin (LIPITOR)  10 MG tablet Take 1 tablet (10 mg total) by mouth daily. 90 tablet 3  . fluticasone (FLONASE) 50 MCG/ACT nasal spray Place 2 sprays into both nostrils daily. 16 g 3  . folic acid (FOLVITE) 638 MCG tablet Take 400 mcg by mouth daily.    . Multiple Vitamins-Minerals (ZINC PO) Take 1 tablet by mouth daily.    . nitroGLYCERIN (NITROSTAT) 0.4 MG SL tablet PLACE 1 TABLET UNDER THE TONGUE EVERY 5 MINUTES AS NEEDED FOR CHEST PAIN. MAX 3 DOSES 25 tablet 5   . omeprazole (PRILOSEC) 40 MG capsule Take 40 mg by mouth daily.     . saw palmetto 80 MG capsule Take 80 mg by mouth daily.     . sildenafil (REVATIO) 20 MG tablet 2 - 5 tabs 30 mins prior to intercourse. 20 tablet 11  . vitamin B-12 500 MCG tablet Take 1 tablet (500 mcg total) by mouth daily. 30 tablet 0   No current facility-administered medications for this visit.     Allergies as of 04/11/2018 - Review Complete 04/11/2018  Allergen Reaction Noted  . Penicillins      Family History  Problem Relation Age of Onset  . Hypertension Father   . Diabetes Father   . Heart disease Father   . Lymphoma Mother   . Alcohol abuse Paternal Grandfather     Social History   Socioeconomic History  . Marital status: Married    Spouse name: Not on file  . Number of children: 3  . Years of education: Not on file  . Highest education level: Not on file  Occupational History  . Occupation: Theme park manager, Personnel officer    Employer: [employer]  Social Needs  . Financial resource strain: Not on file  . Food insecurity:    Worry: Not on file    Inability: Not on file  . Transportation needs:    Medical: Not on file    Non-medical: Not on file  Tobacco Use  . Smoking status: Never Smoker  . Smokeless tobacco: Never Used  Substance and Sexual Activity  . Alcohol use: No  . Drug use: No  . Sexual activity: Not on file  Lifestyle  . Physical activity:    Days per week: Not on file    Minutes per session: Not on file  . Stress: Not on file  Relationships  . Social connections:    Talks on phone: Not on file    Gets together: Not on file    Attends religious service: Not on file    Active member of club or organization: Not on file    Attends meetings of clubs or organizations: Not on file    Relationship status: Not on file  . Intimate partner violence:    Fear of current or ex partner: Not on file    Emotionally abused: Not on file    Physically abused: Not on file     Forced sexual activity: Not on file  Other Topics Concern  . 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     Physical Exam: BP 112/68   Pulse 62   Ht 6\' 1"  (1.854 m)   Wt 210 lb 6 oz (95.4 kg)   BMI 27.76 kg/m  Constitutional: generally well-appearing Psychiatric: alert and oriented x3 Abdomen: soft, nontender, nondistended, no obvious ascites, no peritoneal signs, normal bowel sounds No peripheral edema noted in lower extremities  Assessment and plan: 67 y.o. male with change in bowel habits, history of Barrett's esophagus  He has had a relative change in his bowel habits, he has been a bit more constipated lately.  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  He has short segment Barrett's esophagus, the last upper endoscopy surveillance was 6 years ago and we will do EGD at the same time as his colonoscopy.  Please see the "Patient Instructions" section for addition details about the plan.  Owens Loffler, MD [facility] 04/11/2018, 3:33 PM

## 2018-04-11 NOTE — Patient Instructions (Addendum)
You will be set up for an upper endoscopy for Barrett's surveillance. You will be set up for a recent change in bowel habits.  Normal BMI (Body Mass Index- based on height and weight) is between 23 and 30. Your BMI today is Body mass index is 27.76 kg/m. Marland Kitchen Please consider follow up  regarding your BMI with your Primary Care Provider.

## 2018-05-24 ENCOUNTER — Encounter: Payer: Self-pay | Admitting: Gastroenterology

## 2018-05-24 ENCOUNTER — Ambulatory Visit (AMBULATORY_SURGERY_CENTER): Payer: Medicare Other | Admitting: Gastroenterology

## 2018-05-24 VITALS — BP 118/64 | HR 69 | Temp 97.1°F | Resp 12 | Ht 73.0 in | Wt 210.0 lb

## 2018-05-24 DIAGNOSIS — K2270 Barrett's esophagus without dysplasia: Secondary | ICD-10-CM

## 2018-05-24 DIAGNOSIS — Z1211 Encounter for screening for malignant neoplasm of colon: Secondary | ICD-10-CM | POA: Diagnosis not present

## 2018-05-24 DIAGNOSIS — R194 Change in bowel habit: Secondary | ICD-10-CM | POA: Diagnosis not present

## 2018-05-24 DIAGNOSIS — K219 Gastro-esophageal reflux disease without esophagitis: Secondary | ICD-10-CM | POA: Diagnosis not present

## 2018-05-24 MED ORDER — SODIUM CHLORIDE 0.9 % IV SOLN: 500.0000 mL | Freq: Once | INTRAVENOUS | Status: DC

## 2018-05-24 NOTE — Op Note (Signed)
[facility] Patient Name: Phillip [name] Procedure Date: 05/24/2018 2:36 PM MRN: [id number] Endoscopist: Milus Banister , MD Age: 67 Referring MD:  Date of Birth: Jun 07, 1951 Gender: Male Account #: [id number] Procedure:                Colonoscopy Indications:              Change in bowel habits. Elevated risk for colon                            cancer (HRA) Colonoscopy in 2006, a large right                            sided TVA with severe dysplasia was found. this                            was too large to be removed endoscopically and so                            he underwent a right hemicolectomy. Surgical                            pathology confirmed this was not a cancer, 18 nodes                            were sampled and all were negative. Underwent                            second colonoscopy in 2007, clear anastomosis.                            Repeat colonoscopy [name] 12/2008 found right                            hemicolectomy anastomosis, o/w normal. Colonoscopy                            07/2014 Dr. Ardis Hughs normal right hemicolectomy                            anastomosis, region in sigmoid with abnormal                            mucosa, biopsied and tattood; path showed lymphoid                            aggregates. Medicines:                Monitored Anesthesia Care Procedure:                Pre-Anesthesia Assessment:                           - Prior to the procedure, a History and Physical  was performed, and patient medications and                            allergies were reviewed. The patient's tolerance of                            previous anesthesia was also reviewed. The risks                            and benefits of the procedure and the sedation                            options and risks were discussed with the patient.                            All questions were answered, and informed consent                       was obtained. Prior Anticoagulants: The patient has                            taken no previous anticoagulant or antiplatelet                            agents. ASA Grade Assessment: II - A patient with                            mild systemic disease. After reviewing the risks                            and benefits, the patient was deemed in                            satisfactory condition to undergo the procedure.                           After obtaining informed consent, the colonoscope                            was passed under direct vision. Throughout the                            procedure, the patient's blood pressure, pulse, and                            oxygen saturations were monitored continuously. The                            Colonoscope was introduced through the anus and                            advanced to the the ileocolonic anastomosis. The  colonoscopy was performed without difficulty. The                            patient tolerated the procedure well. The quality                            of the bowel preparation was good. The rectum was                            photographed. Scope In: 2:43:16 PM Scope Out: 2:57:30 PM Scope Withdrawal Time: 0 hours 7 minutes 27 seconds  Total Procedure Duration: 0 hours 14 minutes 14 seconds  Findings:                 Normal ileocolonic anastomosis.                           Previously injected SPOT tattoo in sigmoid colon                            was noted, normal mucosa in the region.                           The exam was otherwise without abnormality on                            direct and retroflexion views. Complications:            No immediate complications. Estimated blood loss:                            None. Estimated Blood Loss:     Estimated blood loss: none. Impression:               - Normal ileocolonic anastomosis.                           - Previously  injected SPOT tattoo in sigmoid colon                            was noted, normal mucosa in the region.                           - No polyps or cancers. Recommendation:           - Patient has a contact number available for                            emergencies. The signs and symptoms of potential                            delayed complications were discussed with the                            patient. Return to normal activities tomorrow.  Written discharge instructions were provided to the                            patient.                           - Resume previous diet.                           - Continue present medications.                           - Repeat colonoscopy in 5 years for screening                            purposes. Milus Banister, MD 05/24/2018 3:01:19 PM This report has been signed electronically.

## 2018-05-24 NOTE — Progress Notes (Signed)
A and O x3. Report to RN. Tolerated MAC anesthesia well.Teeth unchanged after procedure.

## 2018-05-24 NOTE — Op Note (Signed)
[facility] Patient Name: Phillip [name] Procedure Date: 05/24/2018 2:36 PM MRN: [id number] Endoscopist: Milus Banister , MD Age: 67 Referring MD:  Date of Birth: [birth year] Gender: Male Account #: [id number] Procedure:                Upper GI endoscopy Indications:              Barrett's short segment: EGD 2006 no dysplasia,                            EGD 2010 ([facility]) same, no dysplasia, recommended                            recall at 3 year interval. EGD 06/2012 Dr. Ardis Hughs,                            1.5cm Barrett's mucosa, non-nodular. Path showed IM                            without dysplasia. Medicines:                Monitored Anesthesia Care Procedure:                Pre-Anesthesia Assessment:                           - Prior to the procedure, a History and Physical                            was performed, and patient medications and                            allergies were reviewed. The patient's tolerance of                            previous anesthesia was also reviewed. The risks                            and benefits of the procedure and the sedation                            options and risks were discussed with the patient.                            All questions were answered, and informed consent                            was obtained. Prior Anticoagulants: The patient has                            taken no previous anticoagulant or antiplatelet                            agents. ASA Grade Assessment: II - A patient with  mild systemic disease. After reviewing the risks                            and benefits, the patient was deemed in                            satisfactory condition to undergo the procedure.                           - Prior to the procedure, a History and Physical                            was performed, and patient medications and                            allergies were reviewed. The patient's  tolerance of                            previous anesthesia was also reviewed. The risks                            and benefits of the procedure and the sedation                            options and risks were discussed with the patient.                            All questions were answered, and informed consent                            was obtained. Prior Anticoagulants: The patient has                            taken no previous anticoagulant or antiplatelet                            agents. ASA Grade Assessment: II - A patient with                            mild systemic disease. After reviewing the risks                            and benefits, the patient was deemed in                            satisfactory condition to undergo the procedure.                           After obtaining informed consent, the endoscope was                            passed under direct vision. Throughout the  procedure, the patient's blood pressure, pulse, and                            oxygen saturations were monitored continuously. The                            Endoscope was introduced through the mouth, and                            advanced to the second part of duodenum. The upper                            GI endoscopy was accomplished without difficulty.                            The patient tolerated the procedure well. Scope In: Scope Out: Findings:                 There were esophageal mucosal changes consistent                            with short-segment, non-nodular Barrett's esophagus                            present in the distal esophagus ([place]). Biopsies were taken with a                            cold forceps for histology.                           The exam was otherwise without abnormality. Complications:            No immediate complications. Estimated blood loss:                             None. Estimated Blood Loss:     Estimated blood loss: none. Impression:               - Non-nodular, short segment Barrett's esophagus.                           - The examination was otherwise normal. Recommendation:           - Patient has a contact number available for                            emergencies. The signs and symptoms of potential                            delayed complications were discussed with the                            patient. Return to normal activities tomorrow.  Written discharge instructions were provided to the                            patient.                           - Resume previous diet.                           - Continue present medications. OK to decrease your                            antiacid medicine to 20mg  once daily (from 40).                           - Await pathology results. Given newest data,                            guidelines will likely recommend repeat EGD in 4-5                            years pending final pathology review. Milus Banister, MD 05/24/2018 3:11:44 PM This report has been signed electronically.

## 2018-05-24 NOTE — Patient Instructions (Signed)
Handouts:  Barrett's Esophagus  YOU HAD AN ENDOSCOPIC PROCEDURE TODAY AT THE [facility]:   Refer to the procedure report that was given to you for any specific questions about what was found during the examination.  If the procedure report does not answer your questions, please call your gastroenterologist to clarify.  If you requested that your care partner not be given the details of your procedure findings, then the procedure report has been included in a sealed envelope for you to review at your convenience later.  YOU SHOULD EXPECT: Some feelings of bloating in the abdomen. Passage of more gas than usual.  Walking can help get rid of the air that was put into your GI tract during the procedure and reduce the bloating. If you had a lower endoscopy (such as a colonoscopy or flexible sigmoidoscopy) you may notice spotting of blood in your stool or on the toilet paper. If you underwent a bowel prep for your procedure, you may not have a normal bowel movement for a few days.  Please Note:  You might notice some irritation and congestion in your nose or some drainage.  This is from the oxygen used during your procedure.  There is no need for concern and it should clear up in a day or so.  SYMPTOMS TO REPORT IMMEDIATELY:   Following lower endoscopy (colonoscopy or flexible sigmoidoscopy):  Excessive amounts of blood in the stool  Significant tenderness or worsening of abdominal pains  Swelling of the abdomen that is new, acute  Fever of 100F or higher   Following upper endoscopy (EGD)  Vomiting of blood or coffee ground material  New chest pain or pain under the shoulder blades  Painful or persistently difficult swallowing  New shortness of breath  Fever of 100F or higher  Black, tarry-looking stools  For urgent or emergent issues, a gastroenterologist can be reached at any hour by calling [phone].   DIET:  We do recommend a small meal at first, but then you may  proceed to your regular diet.  Drink plenty of fluids but you should avoid alcoholic beverages for 24 hours.  ACTIVITY:  You should plan to take it easy for the rest of today and you should NOT DRIVE or use heavy machinery until tomorrow (because of the sedation medicines used during the test).    FOLLOW UP: Our staff will call the number listed on your records the next business day following your procedure to check on you and address any questions or concerns that you may have regarding the information given to you following your procedure. If we do not reach you, we will leave a message.  However, if you are feeling well and you are not experiencing any problems, there is no need to return our call.  We will assume that you have returned to your regular daily activities without incident.  If any biopsies were taken you will be contacted by phone or by letter within the next 1-3 weeks.  Please call us at [phone] if you have not heard about the biopsies in 3 weeks.    SIGNATURES/CONFIDENTIALITY: You and/or your care partner have signed paperwork which will be entered into your electronic medical record.  These signatures attest to the fact that that the information above on your After Visit Summary has been reviewed and is understood.  Full responsibility of the confidentiality of this discharge information lies with you and/or your care-partner.

## 2018-05-24 NOTE — Progress Notes (Signed)
Called to room to assist during endoscopic procedure.  Patient ID and intended procedure confirmed with present staff. Received instructions for my participation in the procedure from the performing physician.  

## 2018-05-25 ENCOUNTER — Telehealth: Payer: Self-pay | Admitting: *Deleted

## 2018-05-25 NOTE — Telephone Encounter (Signed)
  Follow up Call-  Call back number 05/24/2018  Post procedure Call Back phone  # [phone]  Permission to leave phone message Yes  Some recent data might be hidden     Patient questions:  Do you have a fever, pain , or abdominal swelling? No. Pain Score  0 *  Have you tolerated food without any problems? Yes.    Have you been able to return to your normal activities? Yes.    Do you have any questions about your discharge instructions: Diet   No. Medications  No. Follow up visit  No.  Do you have questions or concerns about your Care? No.  Actions: * If pain score is 4 or above: No action needed, pain <4.

## 2018-06-01 ENCOUNTER — Encounter: Payer: Self-pay | Admitting: Gastroenterology

## 2018-06-14 DIAGNOSIS — Z23 Encounter for immunization: Secondary | ICD-10-CM | POA: Diagnosis not present

## 2018-06-27 ENCOUNTER — Ambulatory Visit (INDEPENDENT_AMBULATORY_CARE_PROVIDER_SITE_OTHER): Payer: Medicare Other | Admitting: Internal Medicine

## 2018-06-27 ENCOUNTER — Encounter: Payer: Self-pay | Admitting: Internal Medicine

## 2018-06-27 VITALS — BP 118/76 | HR 72 | Temp 98.2°F | Wt 209.0 lb

## 2018-06-27 DIAGNOSIS — R0981 Nasal congestion: Secondary | ICD-10-CM

## 2018-06-27 DIAGNOSIS — J029 Acute pharyngitis, unspecified: Secondary | ICD-10-CM | POA: Diagnosis not present

## 2018-06-27 DIAGNOSIS — I2510 Atherosclerotic heart disease of native coronary artery without angina pectoris: Secondary | ICD-10-CM | POA: Diagnosis not present

## 2018-06-27 DIAGNOSIS — J020 Streptococcal pharyngitis: Secondary | ICD-10-CM | POA: Diagnosis not present

## 2018-06-27 MED ORDER — DOXYCYCLINE HYCLATE 100 MG PO TABS: 100.0000 mg | ORAL_TABLET | Freq: Two times a day (BID) | ORAL | 0 refills | Status: DC

## 2018-06-27 NOTE — Patient Instructions (Signed)

## 2018-06-27 NOTE — Progress Notes (Signed)
HPI  Pt presents to the clinic today with c/o nasal congestion and sore throat. He reports this started 5 days ago. He is blowing clear mucous out of his nose. He is having some difficulty swallowing. He denies runny nose, ear pain, cough or shortness of breath. He denies fever, chills or body aches. He has tried Sudafed, Nyquil with minimal relief. He has a history of allergies. He has had sick contacts.  Review of Systems      Past Medical History:  Diagnosis Date  . Allergic rhinitis   . Arthritis   . CAD (coronary artery disease)    a. 06/2016 Echo: EF 55-60%, no rwma, mild AI, triv MR/TR/PR, lipomatous hypertrophy of atrial septum;  b. 06/2016 Ex MV: EF 56%, large, partially reversible anteroseptal, inferoseptal, inferior,and apical perfusion defect - borderline for ischemia (TID 1.64) - equivocal study;  c. 06/2016 PCI: LM nl, LAD 25p/84m (3.0x24 Promus Premier MR DES), LCX min irregs, RCA nl.  . Cataract      left eye   . Colon polyps    a. Severe dysplasia in a right-sided polyp removed surgically in [place], 2006--> final pathology showed this was not a cancer.  Follow up colonoscopy at one year interval was normal.  . Erectile dysfunction   . GERD (gastroesophageal reflux disease)    H/o Barrett's esophagus, orginally diagnosed in 2006, biopsies showed no dysplasia; follow up EGD also showed no dysplasia  . History of BPH   . Lyme disease    x2    Family History  Problem Relation Age of Onset  . Hypertension Father   . Diabetes Father   . Heart disease Father   . Lymphoma Mother   . Alcohol abuse Paternal Grandfather     Social History   Socioeconomic History  . Marital status: Married    Spouse name: Not on file  . Number of children: 3  . Years of education: Not on file  . Highest education level: Not on file  Occupational History  . Occupation: Theme park manager, Personnel officer    Employer: [employer]  Social Needs  . Financial resource strain: Not on  file  . Food insecurity:    Worry: Not on file    Inability: Not on file  . Transportation needs:    Medical: Not on file    Non-medical: Not on file  Tobacco Use  . Smoking status: Never Smoker  . Smokeless tobacco: Never Used  Substance and Sexual Activity  . Alcohol use: No  . Drug use: No  . Sexual activity: Not on file  Lifestyle  . Physical activity:    Days per week: Not on file    Minutes per session: Not on file  . Stress: Not on file  Relationships  . Social connections:    Talks on phone: Not on file    Gets together: Not on file    Attends religious service: Not on file    Active member of club or organization: Not on file    Attends meetings of clubs or organizations: Not on file    Relationship status: Not on file  . Intimate partner violence:    Fear of current or ex partner: Not on file    Emotionally abused: Not on file    Physically abused: Not on file    Forced sexual activity: Not on file  Other Topics Concern  . Not on file  Social History Narrative   Lives in [place] with his  wife.  [employer] and Museum/gallery conservator.    Allergies  Allergen Reactions  . Penicillins     REACTION: Rash as a child - Tolerated AMOXICILLIN SEVERAL TIMES AS ADULT RECENTLY Has patient had a PCN reaction causing immediate rash, facial/tongue/throat swelling, SOB or lightheadedness with hypotension: YES Has patient had a PCN reaction causing severe rash involving mucus membranes or skin necrosis:NO Has patient had a PCN reaction that required hospitalization NO Has patient had a PCN reaction occurring within the last 10 years: NO If all of the above answers are "NO", then may proceed with      Constitutional: Denies headache, fatigue, fever or abrupt weight changes.  HEENT:  Positive nasal congestion, sore throat. Denies eye redness, eye pain, pressure behind the eyes, facial pain, ear pain, ringing in the ears, wax buildup, runny nose or bloody nose. Respiratory: Denies  cough, difficulty breathing or shortness of breath.  Cardiovascular: Denies chest pain, chest tightness, palpitations or swelling in the hands or feet.   No other specific complaints in a complete review of systems (except as listed in HPI above).  Objective:   BP 118/76   Pulse 72   Temp 98.2 F (36.8 C) (Oral)   Wt 209 lb (94.8 kg)   SpO2 98%   BMI 27.57 kg/m   Wt Readings from Last 3 Encounters:  06/27/18 209 lb (94.8 kg)  05/24/18 210 lb (95.3 kg)  04/11/18 210 lb 6 oz (95.4 kg)     General: Appears his stated age, well developed, well nourished in NAD. HEENT: Head: normal shape and size, no sinus tenderness noted;  Ears: Tm's gray and intact, normal light reflex; Nose: mucosa pink and moist, septum midline; Throat/Mouth: + PND. Teeth present, mucosa erythematous and moist, no exudate noted, no lesions or ulcerations noted.  Neck: No cervical lymphadenopathy.  Cardiovascular: Normal rate and rhythm. Pulmonary/Chest: Normal effort and positive vesicular breath sounds. No respiratory distress. No wheezes, rales or ronchi noted.       Assessment & Plan:   Nasal Congestion, Sore Throat:  RST: positive Get some rest and drink plenty of water Do salt water gargles for the sore throat eRx for Doxycycline 100 mg BID x 10 days Ibuprofen 600 mg every 8 hours as needed   RTC as needed or if symptoms persist.   Webb Silversmith, NP

## 2018-08-03 ENCOUNTER — Other Ambulatory Visit: Payer: Self-pay

## 2018-08-03 MED ORDER — NITROGLYCERIN 0.4 MG SL SUBL: SUBLINGUAL_TABLET | SUBLINGUAL | 2 refills | Status: DC

## 2018-08-03 NOTE — Telephone Encounter (Signed)
*  STAT* If patient is at the pharmacy, call can be transferred to refill team.   1. Which medications need to be refilled? (please list name of each medication and dose if known) Nitroglycerin  2. Which pharmacy/location (including street and city if local pharmacy) is medication to be sent to? [facility]  3. Do they need a 30 day or 90 day supply?

## 2018-08-03 NOTE — Telephone Encounter (Signed)
Refill sent for NTG 0.4 mg SL one tablet as needed.

## 2018-09-06 DIAGNOSIS — H35371 Puckering of macula, right eye: Secondary | ICD-10-CM | POA: Diagnosis not present

## 2018-09-06 DIAGNOSIS — H35411 Lattice degeneration of retina, right eye: Secondary | ICD-10-CM | POA: Diagnosis not present

## 2018-09-06 DIAGNOSIS — H43813 Vitreous degeneration, bilateral: Secondary | ICD-10-CM | POA: Diagnosis not present

## 2018-10-04 DIAGNOSIS — H35371 Puckering of macula, right eye: Secondary | ICD-10-CM | POA: Diagnosis not present

## 2018-10-04 DIAGNOSIS — H43813 Vitreous degeneration, bilateral: Secondary | ICD-10-CM | POA: Diagnosis not present

## 2018-10-04 DIAGNOSIS — H4311 Vitreous hemorrhage, right eye: Secondary | ICD-10-CM | POA: Diagnosis not present

## 2018-10-30 DIAGNOSIS — H43813 Vitreous degeneration, bilateral: Secondary | ICD-10-CM | POA: Diagnosis not present

## 2018-10-30 DIAGNOSIS — H35371 Puckering of macula, right eye: Secondary | ICD-10-CM | POA: Diagnosis not present

## 2018-10-30 DIAGNOSIS — H43821 Vitreomacular adhesion, right eye: Secondary | ICD-10-CM | POA: Diagnosis not present

## 2018-10-30 DIAGNOSIS — H4311 Vitreous hemorrhage, right eye: Secondary | ICD-10-CM | POA: Diagnosis not present

## 2018-11-18 ENCOUNTER — Other Ambulatory Visit: Payer: Self-pay | Admitting: Family Medicine

## 2018-11-30 ENCOUNTER — Ambulatory Visit: Payer: No Typology Code available for payment source

## 2018-12-04 ENCOUNTER — Encounter: Payer: No Typology Code available for payment source | Admitting: Family Medicine

## 2019-02-06 DIAGNOSIS — H43821 Vitreomacular adhesion, right eye: Secondary | ICD-10-CM | POA: Diagnosis not present

## 2019-02-06 DIAGNOSIS — H35371 Puckering of macula, right eye: Secondary | ICD-10-CM | POA: Diagnosis not present

## 2019-02-15 ENCOUNTER — Other Ambulatory Visit: Payer: Self-pay | Admitting: Family Medicine

## 2019-04-17 ENCOUNTER — Telehealth: Payer: Self-pay

## 2019-04-17 NOTE — Telephone Encounter (Signed)
Starting on 04/14/19 pt had low grade fever of 99 for short period; pt thinks 1-2 hrs of low grade fever, chills,non prod cough, body aches, chest congestion. Today pt feels better, minor chest congestion with non prod cough, pt taking OTC cough med; slight S/T. The muscle pain and body aches are better. Pt has not traveled but  There was a member of pts church who has tested positive for covid last wk. Pt did not have a mask on due to social distancing; pt said he did not come in direct contact with the person who had + covid. 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 Pt said he does not think he needs virtual appt since he is feeling better but would like to get covid testing.Please advise. Pt request cb on 04/18/19

## 2019-04-18 ENCOUNTER — Other Ambulatory Visit: Payer: Self-pay

## 2019-04-18 DIAGNOSIS — R6889 Other general symptoms and signs: Secondary | ICD-10-CM | POA: Diagnosis not present

## 2019-04-18 DIAGNOSIS — Z20822 Contact with and (suspected) exposure to covid-19: Secondary | ICD-10-CM

## 2019-04-18 NOTE — Telephone Encounter (Signed)
This is exactly what I was going to tell him to do - just caught up at lunch

## 2019-04-18 NOTE — Telephone Encounter (Signed)
Pt called back he is going to [facility] location to get covid test done

## 2019-04-19 LAB — NOVEL CORONAVIRUS, NAA: SARS-CoV-2, NAA: DETECTED — AB

## 2019-04-25 ENCOUNTER — Other Ambulatory Visit: Payer: No Typology Code available for payment source

## 2019-05-02 ENCOUNTER — Ambulatory Visit (INDEPENDENT_AMBULATORY_CARE_PROVIDER_SITE_OTHER): Payer: Medicare Other

## 2019-05-02 ENCOUNTER — Ambulatory Visit: Payer: No Typology Code available for payment source

## 2019-05-02 DIAGNOSIS — Z0000 Encounter for general adult medical examination without abnormal findings: Secondary | ICD-10-CM | POA: Diagnosis not present

## 2019-05-02 NOTE — Progress Notes (Signed)
Subjective:   Phillip [name] is a 68 y.o. male who presents for Medicare Annual/Subsequent preventive examination.  Review of Systems:    This visit is being conducted through telemedicine due to the COVID-19 pandemic. This patient has given me verbal consent via doximity to conduct this visit, patient states they are participating from their home address. Some vital signs may be absent or patient reported.    Patient identification: identified by name, DOB, and current address  Cardiac Risk Factors include: advanced age (>55men, >63 women);male gender;dyslipidemia     Objective:    Vitals: There were no vitals taken for this visit.  There is no height or weight on file to calculate BMI.  Advanced Directives 05/02/2019 11/18/2017 08/02/2016 07/13/2016 07/12/2016  Does Patient Have a Medical Advance Directive? No No No No No  Would patient like information on creating a medical advance directive? No - Patient declined Yes (MAU/Ambulatory/Procedural Areas - Information given) Yes (MAU/Ambulatory/Procedural Areas - Information given) No - Patient declined No - Patient declined    Tobacco Social History   Tobacco Use  Smoking Status Never Smoker  Smokeless Tobacco Never Used     Counseling given: Not Answered   Clinical Intake:  Pre-visit preparation completed: Yes  Pain : No/denies pain     Nutritional Risks: None Diabetes: No  How often do you need to have someone help you when you read instructions, pamphlets, or other written materials from your doctor or pharmacy?: 1 - Never What is the last grade level you completed in school?: high school  Interpreter Needed?: No  Information entered by :: CJohnson, LPN  Past Medical History:  Diagnosis Date  . Allergic rhinitis   . Arthritis   . CAD (coronary artery disease)    a. 06/2016 Echo: EF 55-60%, no rwma, mild AI, triv MR/TR/PR, lipomatous hypertrophy of atrial septum;  b. 06/2016 Ex MV: EF 56%, large, partially  reversible anteroseptal, inferoseptal, inferior,and apical perfusion defect - borderline for ischemia (TID 1.64) - equivocal study;  c. 06/2016 PCI: LM nl, LAD 25p/33m (3.0x24 Promus Premier MR DES), LCX min irregs, RCA nl.  . Cataract      left eye   . Colon polyps    a. Severe dysplasia in a right-sided polyp removed surgically in [place], 2006--> final pathology showed this was not a cancer.  Follow up colonoscopy at one year interval was normal.  . Erectile dysfunction   . GERD (gastroesophageal reflux disease)    H/o Barrett's esophagus, orginally diagnosed in 2006, biopsies showed no dysplasia; follow up EGD also showed no dysplasia  . History of BPH   . Lyme disease    x2   Past Surgical History:  Procedure Laterality Date  . CARDIAC CATHETERIZATION N/A 07/14/2016   Procedure: Left Heart Cath and Coronary Angiography;  Surgeon: Sherren Mocha, MD;  Location: [facility];  Service: Cardiovascular;  Laterality: N/A;  . CARDIAC CATHETERIZATION N/A 07/14/2016   Procedure: Coronary Stent Intervention;  Surgeon: Sherren Mocha, MD;  Location: [facility];  Service: Cardiovascular;  Laterality: N/A;  . COLONOSCOPY  12/2008   Repeat 5 years  . ESOPHAGOGASTRODUODENOSCOPY  12/2008   Barrett's, repeat 3 years  . HEMICOLECTOMY  2006   Right, for severely dysplastic right colon polyp  . HERNIA REPAIR  [id number]   UMBILICAL  . LUMBAR DISC SURGERY  1992   L4-5  . TONSILLECTOMY     As a child  . TUMOR REMOVAL  2002  Fatty tumor (neck)  . UPPER GASTROINTESTINAL ENDOSCOPY    . VARICOSE VEIN SURGERY  1997   Removal, right   Family History  Problem Relation Age of Onset  . Hypertension Father   . Diabetes Father   . Heart disease Father   . Lymphoma Mother   . Alcohol abuse Paternal Grandfather    Social History   Socioeconomic History  . Marital status: Married    Spouse name: Not on file  . Number of children: 3  . Years of education: Not on file  . Highest  education level: Not on file  Occupational History  . Occupation: Theme park manager, Personnel officer    Employer: [employer]  Social Needs  . Financial resource strain: Not hard at all  . Food insecurity    Worry: Never true    Inability: Never true  . Transportation needs    Medical: No    Non-medical: No  Tobacco Use  . Smoking status: Never Smoker  . Smokeless tobacco: Never Used  Substance and Sexual Activity  . Alcohol use: No  . Drug use: No  . Sexual activity: Not on file  Lifestyle  . Physical activity    Days per week: 0 days    Minutes per session: 0 min  . Stress: Not at all  Relationships  . Social Herbalist on phone: Not on file    Gets together: Not on file    Attends religious service: Not on file    Active member of club or organization: Not on file    Attends meetings of clubs or organizations: Not on file    Relationship status: Not on file  Other Topics Concern  . Not on file  Social History Narrative   Lives in [place] with his wife.  Tax inspector.    Outpatient Encounter Medications as of 05/02/2019  Medication Sig  . Ascorbic Acid (VITAMIN C PO) Take 1 tablet by mouth daily.  Marland Kitchen aspirin EC 81 MG tablet Take 1 tablet (81 mg total) by mouth daily.  Marland Kitchen atorvastatin (LIPITOR) 10 MG tablet TAKE 1 TABLET BY MOUTH EVERY DAY  . doxycycline (VIBRA-TABS) 100 MG tablet Take 1 tablet (100 mg total) by mouth 2 (two) times daily.  . fluticasone (FLONASE) 50 MCG/ACT nasal spray Place 2 sprays into both nostrils daily.  . folic acid (FOLVITE) [id number] MCG tablet Take 400 mcg by mouth daily.  . Multiple Vitamins-Minerals (ZINC PO) Take 1 tablet by mouth daily.  . nitroGLYCERIN (NITROSTAT) 0.4 MG SL tablet PLACE 1 TABLET UNDER THE TONGUE EVERY 5 MINUTES AS NEEDED FOR CHEST PAIN. MAX 3 DOSES  . omeprazole (PRILOSEC OTC) 20 MG tablet Take 20 mg by mouth daily.  . saw palmetto 80 MG capsule Take 80 mg by mouth daily.   . sildenafil (REVATIO) 20  MG tablet 2 - 5 tabs 30 mins prior to intercourse.  . vitamin B-12 500 MCG tablet Take 1 tablet (500 mcg total) by mouth daily. (Patient taking differently: Take 500 mcg by mouth every other day. )   No facility-administered encounter medications on file as of 05/02/2019.     Activities of Daily Living In your present state of health, do you have any difficulty performing the following activities: 05/02/2019  Hearing? Y  Comment ringing in the left ear  Vision? N  Difficulty concentrating or making decisions? N  Walking or climbing stairs? N  Dressing or bathing? N  Doing errands, shopping? N  Preparing Food and eating ? N  Using the Toilet? N  In the past six months, have you accidently leaked urine? N  Do you have problems with loss of bowel control? N  Managing your Medications? N  Managing your Finances? N  Housekeeping or managing your Housekeeping? N  Some recent data might be hidden    Patient Care Team: Owens Loffler, MD as PCP - General   Assessment:   This is a routine wellness examination for Skylar.  Exercise Activities and Dietary recommendations Current Exercise Habits: Home exercise routine, Type of exercise: walking, Time (Minutes): 30, Frequency (Times/Week): 3, Weekly Exercise (Minutes/Week): 90, Intensity: Mild, Exercise limited by: None identified  Goals    . Increase physical activity     Starting 11/18/2017, I will continue to exercise at gym for 90 minutes once weekly and to remain a Social research officer, government.     . Patient Stated     05/02/19, Patient wants to increase exercise and get back to the gym        Fall Risk Fall Risk  05/02/2019 11/18/2017 08/02/2016  Falls in the past year? 0 No No  Number falls in past yr: 0 - -  Risk for fall due to : Medication side effect - -  Follow up Falls evaluation completed;Falls prevention discussed - -   Is the patient's home free of loose throw rugs in walkways, pet beds, electrical cords, etc?   yes      Grab  bars in the bathroom? yes      Handrails on the stairs?   yes      Adequate lighting?   yes  Timed Get Up and Go Performed: n/a  Depression Screen PHQ 2/9 Scores 05/02/2019 11/18/2017 08/02/2016  PHQ - 2 Score 0 0 0  PHQ- 9 Score 0 0 1    Cognitive Function MMSE - Mini Mental State Exam 05/02/2019 11/18/2017  Orientation to time 5 5  Orientation to Place 5 5  Registration 3 3  Attention/ Calculation 5 0  Recall 3 3  Language- name 2 objects - 0  Language- repeat 1 1  Language- follow 3 step command - 3  Language- read & follow direction - 0  Write a sentence - 0  Copy design - 0  Total score - 20     Mini Cog  Mini-Cog screen was completed. Maximum score is 22. A value of 0 denotes this part of the MMSE was not completed or the patient failed this part of the Mini-Cog screening.    Immunization History  Administered Date(s) Administered  . H1N1 09/26/2008  . Influenza Whole 09/26/2008  . Influenza, High Dose Seasonal PF 05/31/2017, 06/14/2018  . Influenza,inj,Quad PF,6+ Mos 05/30/2014, 06/07/2015, 05/10/2016  . Pneumococcal Conjugate-13 07/22/2016  . Pneumococcal Polysaccharide-23 08/17/2007, 11/18/2017  . Td 12/16/2008    Qualifies for Shingles Vaccine? yes  Screening Tests Health Maintenance  Topic Date Due  . TETANUS/TDAP  12/17/2018  . INFLUENZA VACCINE  03/17/2019  . COLONOSCOPY  05/25/2023  . Hepatitis C Screening  Completed  . PNA vac Low Risk Adult  Completed   Cancer Screenings: Lung: Low Dose CT Chest recommended if Age 24-80 years, 30 pack-year currently smoking OR have quit w/in 15years. Patient does not qualify. Colorectal: completed 05/24/2018  Additional Screenings:  Hepatitis C Screening: 11/18/2017      Plan:    Patient wants to increase his exercise and get back in the gym.   I have personally reviewed and  noted the following in the patient's chart:   . Medical and social history . Use of alcohol, tobacco or illicit drugs  . Current  medications and supplements . Functional ability and status . Nutritional status . Physical activity . Advanced directives . List of other physicians . Hospitalizations, surgeries, and ER visits in previous 12 months . Vitals . Screenings to include cognitive, depression, and falls . Referrals and appointments  In addition, I have reviewed and discussed with patient certain preventive protocols, quality metrics, and best practice recommendations. A written personalized care plan for preventive services as well as general preventive health recommendations were provided to patient.     Gwen, Stockert, LPN  [id number]

## 2019-05-02 NOTE — Patient Instructions (Signed)
Phillip [name] , Thank you for taking time to come for your Medicare Wellness Visit. I appreciate your ongoing commitment to your health goals. Please review the following plan we discussed and let me know if I can assist you in the future.   Screening recommendations/referrals: Colonoscopy: up to date, completed 05/24/2018 Recommended yearly ophthalmology/optometry visit for glaucoma screening and checkup Recommended yearly dental visit for hygiene and checkup  Vaccinations: Influenza vaccine: will receive in the office next week  Pneumococcal vaccine: series completed Tdap vaccine: will receive in the office next week  Shingles vaccine: declined    Advanced directives: Advance directive discussed with you today. Even though you declined this today please call our office should you change your mind and we can give you the proper paperwork for you to fill out.   Conditions/risks identified: hyperlipidemia  Next appointment: 05/09/19 @ 8:20 am  Preventive Care 65 Years and Older, Male Preventive care refers to lifestyle choices and visits with your health care provider that can promote health and wellness. What does preventive care include?  A yearly physical exam. This is also called an annual well check.  Dental exams once or twice a year.  Routine eye exams. Ask your health care provider how often you should have your eyes checked.  Personal lifestyle choices, including:  Daily care of your teeth and gums.  Regular physical activity.  Eating a healthy diet.  Avoiding tobacco and drug use.  Limiting alcohol use.  Practicing safe sex.  Taking low doses of aspirin every day.  Taking vitamin and mineral supplements as recommended by your health care provider. What happens during an annual well check? The services and screenings done by your health care provider during your annual well check will depend on your age, overall health, lifestyle risk factors, and family history of  disease. Counseling  Your health care provider may ask you questions about your:  Alcohol use.  Tobacco use.  Drug use.  Emotional well-being.  Home and relationship well-being.  Sexual activity.  Eating habits.  History of falls.  Memory and ability to understand (cognition).  Work and work Statistician. Screening  You may have the following tests or measurements:  Height, weight, and BMI.  Blood pressure.  Lipid and cholesterol levels. These may be checked every 5 years, or more frequently if you are over 47 years old.  Skin check.  Lung cancer screening. You may have this screening every year starting at age 70 if you have a 30-pack-year history of smoking and currently smoke or have quit within the past 15 years.  Fecal occult blood test (FOBT) of the stool. You may have this test every year starting at age 63.  Flexible sigmoidoscopy or colonoscopy. You may have a sigmoidoscopy every 5 years or a colonoscopy every 10 years starting at age 68.  Prostate cancer screening. Recommendations will vary depending on your family history and other risks.  Hepatitis C blood test.  Hepatitis B blood test.  Sexually transmitted disease (STD) testing.  Diabetes screening. This is done by checking your blood sugar (glucose) after you have not eaten for a while (fasting). You may have this done every 1-3 years.  Abdominal aortic aneurysm (AAA) screening. You may need this if you are a current or former smoker.  Osteoporosis. You may be screened starting at age 66 if you are at high risk. Talk with your health care provider about your test results, treatment options, and if necessary, the need for more tests.  Vaccines  Your health care provider may recommend certain vaccines, such as:  Influenza vaccine. This is recommended every year.  Tetanus, diphtheria, and acellular pertussis (Tdap, Td) vaccine. You may need a Td booster every 10 years.  Zoster vaccine. You may  need this after age 55.  Pneumococcal 13-valent conjugate (PCV13) vaccine. One dose is recommended after age 64.  Pneumococcal polysaccharide (PPSV23) vaccine. One dose is recommended after age 43. Talk to your health care provider about which screenings and vaccines you need and how often you need them. This information is not intended to replace advice given to you by your health care provider. Make sure you discuss any questions you have with your health care provider. Document Released: 08/29/2015 Document Revised: 04/21/2016 Document Reviewed: 06/03/2015 Elsevier Interactive Patient Education  2017 [place] Prevention in the Home Falls can cause injuries. They can happen to people of all ages. There are many things you can do to make your home safe and to help prevent falls. What can I do on the outside of my home?  Regularly fix the edges of walkways and driveways and fix any cracks.  Remove anything that might make you trip as you walk through a door, such as a raised step or threshold.  Trim any bushes or trees on the path to your home.  Use bright outdoor lighting.  Clear any walking paths of anything that might make someone trip, such as rocks or tools.  Regularly check to see if handrails are loose or broken. Make sure that both sides of any steps have handrails.  Any raised decks and porches should have guardrails on the edges.  Have any leaves, snow, or ice cleared regularly.  Use sand or salt on walking paths during winter.  Clean up any spills in your garage right away. This includes oil or grease spills. What can I do in the bathroom?  Use night lights.  Install grab bars by the toilet and in the tub and shower. Do not use towel bars as grab bars.  Use non-skid mats or decals in the tub or shower.  If you need to sit down in the shower, use a plastic, non-slip stool.  Keep the floor dry. Clean up any water that spills on the floor as soon as it  happens.  Remove soap buildup in the tub or shower regularly.  Attach bath mats securely with double-sided non-slip rug tape.  Do not have throw rugs and other things on the floor that can make you trip. What can I do in the bedroom?  Use night lights.  Make sure that you have a light by your bed that is easy to reach.  Do not use any sheets or blankets that are too big for your bed. They should not hang down onto the floor.  Have a firm chair that has side arms. You can use this for support while you get dressed.  Do not have throw rugs and other things on the floor that can make you trip. What can I do in the kitchen?  Clean up any spills right away.  Avoid walking on wet floors.  Keep items that you use a lot in easy-to-reach places.  If you need to reach something above you, use a strong step stool that has a grab bar.  Keep electrical cords out of the way.  Do not use floor polish or wax that makes floors slippery. If you must use wax, use non-skid floor wax.  Do not have throw rugs and other things on the floor that can make you trip. What can I do with my stairs?  Do not leave any items on the stairs.  Make sure that there are handrails on both sides of the stairs and use them. Fix handrails that are broken or loose. Make sure that handrails are as long as the stairways.  Check any carpeting to make sure that it is firmly attached to the stairs. Fix any carpet that is loose or worn.  Avoid having throw rugs at the top or bottom of the stairs. If you do have throw rugs, attach them to the floor with carpet tape.  Make sure that you have a light switch at the top of the stairs and the bottom of the stairs. If you do not have them, ask someone to add them for you. What else can I do to help prevent falls?  Wear shoes that:  Do not have high heels.  Have rubber bottoms.  Are comfortable and fit you well.  Are closed at the toe. Do not wear sandals.  If you  use a stepladder:  Make sure that it is fully opened. Do not climb a closed stepladder.  Make sure that both sides of the stepladder are locked into place.  Ask someone to hold it for you, if possible.  Clearly mark and make sure that you can see:  Any grab bars or handrails.  First and last steps.  Where the edge of each step is.  Use tools that help you move around (mobility aids) if they are needed. These include:  Canes.  Walkers.  Scooters.  Crutches.  Turn on the lights when you go into a dark area. Replace any light bulbs as soon as they burn out.  Set up your furniture so you have a clear path. Avoid moving your furniture around.  If any of your floors are uneven, fix them.  If there are any pets around you, be aware of where they are.  Review your medicines with your doctor. Some medicines can make you feel dizzy. This can increase your chance of falling. Ask your doctor what other things that you can do to help prevent falls. This information is not intended to replace advice given to you by your health care provider. Make sure you discuss any questions you have with your health care provider. Document Released: 05/29/2009 Document Revised: 01/08/2016 Document Reviewed: 09/06/2014 Elsevier Interactive Patient Education  2017 [employer].

## 2019-05-02 NOTE — Progress Notes (Signed)
PCP notes: none  Health Maintenance: Patient wants to get his flu and Tdap vaccine at his office visit on 05/09/2019.     Abnormal Screenings: none    Patient concerns: none    Nurse concerns: none    Next PCP appt.: 05/09/2019 @ 8:20 am

## 2019-05-03 ENCOUNTER — Other Ambulatory Visit: Payer: Self-pay | Admitting: Family Medicine

## 2019-05-03 ENCOUNTER — Other Ambulatory Visit (INDEPENDENT_AMBULATORY_CARE_PROVIDER_SITE_OTHER): Payer: Medicare Other

## 2019-05-03 DIAGNOSIS — E785 Hyperlipidemia, unspecified: Secondary | ICD-10-CM

## 2019-05-03 DIAGNOSIS — D696 Thrombocytopenia, unspecified: Secondary | ICD-10-CM | POA: Diagnosis not present

## 2019-05-03 DIAGNOSIS — Z125 Encounter for screening for malignant neoplasm of prostate: Secondary | ICD-10-CM

## 2019-05-03 DIAGNOSIS — N183 Chronic kidney disease, stage 3 unspecified: Secondary | ICD-10-CM

## 2019-05-03 LAB — CBC WITH DIFFERENTIAL/PLATELET
Basophils Absolute: 0 10*3/uL (ref 0.0–0.1)
Basophils Relative: 0.5 % (ref 0.0–3.0)
Eosinophils Absolute: 0 10*3/uL (ref 0.0–0.7)
Eosinophils Relative: 1 % (ref 0.0–5.0)
HCT: 44.8 % (ref 39.0–52.0)
Hemoglobin: 14.7 g/dL (ref 13.0–17.0)
Lymphocytes Relative: 28.6 % (ref 12.0–46.0)
Lymphs Abs: 1.2 10*3/uL (ref 0.7–4.0)
MCHC: 32.9 g/dL (ref 30.0–36.0)
MCV: 92.2 fl (ref 78.0–100.0)
Monocytes Absolute: 0.4 10*3/uL (ref 0.1–1.0)
Monocytes Relative: 9.1 % (ref 3.0–12.0)
Neutro Abs: 2.5 10*3/uL (ref 1.4–7.7)
Neutrophils Relative %: 60.8 % (ref 43.0–77.0)
Platelets: 131 10*3/uL — ABNORMAL LOW (ref 150.0–400.0)
RBC: 4.86 Mil/uL (ref 4.22–5.81)
RDW: 14.3 % (ref 11.5–15.5)
WBC: 4.1 10*3/uL (ref 4.0–10.5)

## 2019-05-03 LAB — COMPREHENSIVE METABOLIC PANEL
ALT: 14 U/L (ref 0–53)
AST: 16 U/L (ref 0–37)
Albumin: 4.1 g/dL (ref 3.5–5.2)
Alkaline Phosphatase: 97 U/L (ref 39–117)
BUN: 18 mg/dL (ref 6–23)
CO2: 30 mEq/L (ref 19–32)
Calcium: 9.5 mg/dL (ref 8.4–10.5)
Chloride: 105 mEq/L (ref 96–112)
Creatinine, Ser: 1.24 mg/dL (ref 0.40–1.50)
GFR: 57.92 mL/min — ABNORMAL LOW (ref >60.00)
Glucose, Bld: 96 mg/dL (ref 70–99)
Potassium: 4.4 mEq/L (ref 3.5–5.1)
Sodium: 141 mEq/L (ref 135–145)
Total Bilirubin: 0.9 mg/dL (ref 0.2–1.2)
Total Protein: 6.6 g/dL (ref 6.0–8.3)

## 2019-05-03 LAB — LIPID PANEL
Cholesterol: 68 mg/dL (ref 0–200)
HDL: 24.3 mg/dL — ABNORMAL LOW (ref >39.00)
LDL Cholesterol: 26 mg/dL (ref 0–99)
NonHDL: 44.05
Total CHOL/HDL Ratio: 3
Triglycerides: 90 mg/dL (ref 0.0–149.0)
VLDL: 18 mg/dL (ref 0.0–40.0)

## 2019-05-03 LAB — VITAMIN D 25 HYDROXY (VIT D DEFICIENCY, FRACTURES): VITD: 64.47 ng/mL (ref 30.00–100.00)

## 2019-05-03 LAB — PSA, MEDICARE: PSA: 3.22 ng/ml (ref 0.10–4.00)

## 2019-05-08 NOTE — Progress Notes (Signed)
Niguel Moure T. Latisia Hilaire, MD Primary Care and [place] at [facility] [place], [address] Phone: [phone]  FAX: [place] - 68 y.o. male  MRN [id number]  Date of Birth: 1951/02/15  Visit Date: 05/09/2019  PCP: Owens Loffler, MD  Referred by: Owens Loffler, MD  CC: f/u medical problems  Subjective:   Phillip [name] is a 68 y.o. very pleasant male patient who presents with the following:  He does have CAD and sees Dr. Saunders Revel and has an upcoming app.   Needs flu vaccine Shingrix needed  He does have CAD and has seen Dr. Saunders Revel CKD stage 3 and is stable  4 weeks Chills, body aches and wekness.  Sat night got sick, and then got tested.  Wife never got it.  F/u Cardiology on Monday - f/u with Dr. Saunders Revel  R eye, [place]?  Compliant with his meds, lipitor, Prilosec, ASA  No major complaints  Past Medical History, Surgical History, Social History, Family History, Problem List, Medications, and Allergies have been reviewed and updated if relevant.  Patient Active Problem List   Diagnosis Date Noted  . Hyperlipidemia LDL goal <70 07/20/2017  . Coronary artery disease involving native coronary artery of native heart without angina pectoris 11/02/2016  . 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 CKD (chronic kidney disease) stage 3, GFR 30-59 ml/min (HCC) 07/13/2016  . Thrombocytopenia ([place]) 04/19/2012  . BARRETTS ESOPHAGUS 12/16/2008  . ERECTILE DYSFUNCTION 09/26/2008  . ALLERGIC RHINITIS 09/26/2008  . GERD 09/26/2008  . COLONIC POLYPS, HX OF 09/26/2008    Past Medical History:  Diagnosis Date  . Allergic rhinitis   . Arthritis   . CAD (coronary artery disease)    a. 06/2016 Echo: EF 55-60%, no rwma, mild AI, triv MR/TR/PR, lipomatous hypertrophy of atrial septum;  b. 06/2016 Ex MV: EF 56%, large, partially reversible anteroseptal, inferoseptal, inferior,and apical perfusion  defect - borderline for ischemia (TID 1.64) - equivocal study;  c. 06/2016 PCI: LM nl, LAD 25p/64m (3.0x24 Promus Premier MR DES), LCX min irregs, RCA nl.  . Cataract      left eye   . Colon polyps    a. Severe dysplasia in a right-sided polyp removed surgically in [place], 2006--> final pathology showed this was not a cancer.  Follow up colonoscopy at one year interval was normal.  . Erectile dysfunction   . GERD (gastroesophageal reflux disease)    H/o Barrett's esophagus, orginally diagnosed in 2006, biopsies showed no dysplasia; follow up EGD also showed no dysplasia  . History of BPH   . Lyme disease    x2    Past Surgical History:  Procedure Laterality Date  . CARDIAC CATHETERIZATION N/A 07/14/2016   Procedure: Left Heart Cath and Coronary Angiography;  Surgeon: Sherren Mocha, MD;  Location: [facility];  Service: Cardiovascular;  Laterality: N/A;  . CARDIAC CATHETERIZATION N/A 07/14/2016   Procedure: Coronary Stent Intervention;  Surgeon: Sherren Mocha, MD;  Location: [facility];  Service: Cardiovascular;  Laterality: N/A;  . COLONOSCOPY  12/2008   Repeat 5 years  . ESOPHAGOGASTRODUODENOSCOPY  12/2008   Barrett's, repeat 3 years  . HEMICOLECTOMY  2006   Right, for severely dysplastic right colon polyp  . HERNIA REPAIR  [id number]   UMBILICAL  . LUMBAR DISC SURGERY  1992   L4-5  . TONSILLECTOMY     As a child  . TUMOR REMOVAL  2002   Fatty tumor (neck)  . UPPER GASTROINTESTINAL ENDOSCOPY    . VARICOSE VEIN SURGERY  1997   Removal, right    Social History   Socioeconomic History  . Marital status: Married    Spouse name: Not on file  . Number of children: 3  . Years of education: Not on file  . Highest education level: Not on file  Occupational History  . Occupation: Theme park manager, Personnel officer    Employer: [employer]  Social Needs  . Financial resource strain: Not hard at all  . Food insecurity    Worry: Never true    Inability: Never true   . Transportation needs    Medical: No    Non-medical: No  Tobacco Use  . Smoking status: Never Smoker  . Smokeless tobacco: Never Used  Substance and Sexual Activity  . Alcohol use: No  . Drug use: No  . Sexual activity: Not on file  Lifestyle  . Physical activity    Days per week: 0 days    Minutes per session: 0 min  . Stress: Not at all  Relationships  . Social Herbalist on phone: Not on file    Gets together: Not on file    Attends religious service: Not on file    Active member of club or organization: Not on file    Attends meetings of clubs or organizations: Not on file    Relationship status: Not on file  . Intimate partner violence    Fear of current or ex partner: No    Emotionally abused: No    Physically abused: No    Forced sexual activity: No  Other Topics Concern  . Not on file  Social History Narrative   Lives in [place] with his wife.  Tax inspector.    Family History  Problem Relation Age of Onset  . Hypertension Father   . Diabetes Father   . Heart disease Father   . Lymphoma Mother   . Alcohol abuse Paternal Grandfather     Allergies  Allergen Reactions  . Penicillins     REACTION: Rash as a child - Tolerated AMOXICILLIN SEVERAL TIMES AS ADULT RECENTLY Has patient had a PCN reaction causing immediate rash, facial/tongue/throat swelling, SOB or lightheadedness with hypotension: YES Has patient had a PCN reaction causing severe rash involving mucus membranes or skin necrosis:NO Has patient had a PCN reaction that required hospitalization NO Has patient had a PCN reaction occurring within the last 10 years: NO If all of the above answers are "NO", then may proceed with     Medication list reviewed and updated in full in [place].   GEN: No acute illnesses, no fevers, chills. GI: No n/v/d, eating normally Pulm: No SOB Interactive and getting along well at home.  Otherwise, ROS is as per the HPI.   Objective:   BP 110/72   Pulse 80   Temp (!) 97.3 F (36.3 C) (Temporal)   Ht 6' 0.5" (1.842 m)   Wt 207 lb 8 oz (94.1 kg)   SpO2 100%   BMI 27.76 kg/m   GEN: WDWN, NAD, Non-toxic, A & O x 3 HEENT: Atraumatic, Normocephalic. Neck supple. No masses, No LAD. ON HIS RIGHT FACE ADJACENT TO HIS EYE THERE IS CRUSTY PATCH THAT HAS BEEN THERE FOR A WHILE HEALING AND CRUSTIN Ears and Nose: No external deformity. CV: RRR, No M/G/R. No JVD. No thrill. No extra heart  sounds. PULM: CTA B, no wheezes, crackles, rhonchi. No retractions. No resp. distress. No accessory muscle use. EXTR: No c/c/e NEURO Normal gait.  PSYCH: Normally interactive. Conversant. Not depressed or anxious appearing.  Calm demeanor.   Laboratory and Imaging Data: Results for orders placed or performed in visit on 05/03/19  PSA, Medicare  Result Value Ref Range   PSA 3.22 0.10 - 4.00 ng/ml  CBC with Differential/Platelet  Result Value Ref Range   WBC 4.1 4.0 - 10.5 K/uL   RBC 4.86 4.22 - 5.81 Mil/uL   Hemoglobin 14.7 13.0 - 17.0 g/dL   HCT 44.8 39.0 - 52.0 %   MCV 92.2 78.0 - 100.0 fl   MCHC 32.9 30.0 - 36.0 g/dL   RDW 14.3 11.5 - 15.5 %   Platelets 131.0 (L) 150.0 - 400.0 K/uL   Neutrophils Relative % 60.8 43.0 - 77.0 %   Lymphocytes Relative 28.6 12.0 - 46.0 %   Monocytes Relative 9.1 3.0 - 12.0 %   Eosinophils Relative 1.0 0.0 - 5.0 %   Basophils Relative 0.5 0.0 - 3.0 %   Neutro Abs 2.5 1.4 - 7.7 K/uL   Lymphs Abs 1.2 0.7 - 4.0 K/uL   Monocytes Absolute 0.4 0.1 - 1.0 K/uL   Eosinophils Absolute 0.0 0.0 - 0.7 K/uL   Basophils Absolute 0.0 0.0 - 0.1 K/uL  Lipid panel  Result Value Ref Range   Cholesterol 68 0 - 200 mg/dL   Triglycerides 90.0 0.0 - 149.0 mg/dL   HDL 24.30 (L) >39.00 mg/dL   VLDL 18.0 0.0 - 40.0 mg/dL   LDL Cholesterol 26 0 - 99 mg/dL   Total CHOL/HDL Ratio 3    NonHDL 44.05   Comprehensive metabolic panel  Result Value Ref Range   Sodium 141 135 - 145 mEq/L   Potassium 4.4 3.5 - 5.1  mEq/L   Chloride 105 96 - 112 mEq/L   CO2 30 19 - 32 mEq/L   Glucose, Bld 96 70 - 99 mg/dL   BUN 18 6 - 23 mg/dL   Creatinine, Ser 1.24 0.40 - 1.50 mg/dL   Total Bilirubin 0.9 0.2 - 1.2 mg/dL   Alkaline Phosphatase 97 39 - 117 U/L   AST 16 0 - 37 U/L   ALT 14 0 - 53 U/L   Total Protein 6.6 6.0 - 8.3 g/dL   Albumin 4.1 3.5 - 5.2 g/dL   Calcium 9.5 8.4 - 10.5 mg/dL   GFR 57.92 (L) >60.00 mL/min  VITAMIN D 25 Hydroxy (Vit-D Deficiency, Fractures)  Result Value Ref Range   VITD 64.47 30.00 - 100.00 ng/mL     Assessment and Plan:     ICD-10-CM   1. Coronary artery disease involving native coronary artery of native heart without angina pectoris  I25.10   2. Need for influenza vaccination  Z23 Flu Vaccine QUAD High Dose(Fluad)  3. Increased prostate specific antigen (PSA) velocity  R97.20 PSA, Total with Reflex to PSA, Free  4. Skin lesion  L98.9 Ambulatory referral to Dermatology  5. Actinic keratitis of right eye  H16.131 Ambulatory referral to Dermatology   Lesion lesion AK vs BCC, involve Derm  Recheck PSA in 6 mo  Follow-up: No follow-ups on file.  No orders of the defined types were placed in this encounter.  Orders Placed This Encounter  Procedures  . Flu Vaccine QUAD High Dose(Fluad)  . PSA, Total with Reflex to PSA, Free  . Ambulatory referral to Dermatology    Signed,  Frederico Hamman  Celedonio Savage, MD   Outpatient Encounter Medications as of 05/09/2019  Medication Sig  . Ascorbic Acid (VITAMIN C PO) Take 1 tablet by mouth daily.  Marland Kitchen aspirin EC 81 MG tablet Take 1 tablet (81 mg total) by mouth daily.  Marland Kitchen atorvastatin (LIPITOR) 10 MG tablet TAKE 1 TABLET BY MOUTH EVERY DAY  . folic acid (FOLVITE) Q000111Q MCG tablet Take 400 mcg by mouth daily.  . Multiple Vitamins-Minerals (ZINC PO) Take 1 tablet by mouth daily.  . nitroGLYCERIN (NITROSTAT) 0.4 MG SL tablet PLACE 1 TABLET UNDER THE TONGUE EVERY 5 MINUTES AS NEEDED FOR CHEST PAIN. MAX 3 DOSES  . omeprazole (PRILOSEC OTC) 20 MG  tablet Take 20 mg by mouth daily.  . saw palmetto 80 MG capsule Take 80 mg by mouth daily.   . vitamin B-12 500 MCG tablet Take 1 tablet (500 mcg total) by mouth daily. (Patient taking differently: Take 500 mcg by mouth every other day. )  . [DISCONTINUED] doxycycline (VIBRA-TABS) 100 MG tablet Take 1 tablet (100 mg total) by mouth 2 (two) times daily.  . [DISCONTINUED] fluticasone (FLONASE) 50 MCG/ACT nasal spray Place 2 sprays into both nostrils daily.  . [DISCONTINUED] sildenafil (REVATIO) 20 MG tablet 2 - 5 tabs 30 mins prior to intercourse.   No facility-administered encounter medications on file as of 05/09/2019.

## 2019-05-09 ENCOUNTER — Ambulatory Visit (INDEPENDENT_AMBULATORY_CARE_PROVIDER_SITE_OTHER): Payer: Medicare Other | Admitting: Family Medicine

## 2019-05-09 ENCOUNTER — Other Ambulatory Visit: Payer: Self-pay

## 2019-05-09 ENCOUNTER — Encounter: Payer: Self-pay | Admitting: Family Medicine

## 2019-05-09 VITALS — BP 110/72 | HR 80 | Temp 97.3°F | Ht 72.5 in | Wt 207.5 lb

## 2019-05-09 DIAGNOSIS — R972 Elevated prostate specific antigen [PSA]: Secondary | ICD-10-CM

## 2019-05-09 DIAGNOSIS — Z23 Encounter for immunization: Secondary | ICD-10-CM

## 2019-05-09 DIAGNOSIS — L989 Disorder of the skin and subcutaneous tissue, unspecified: Secondary | ICD-10-CM | POA: Diagnosis not present

## 2019-05-09 DIAGNOSIS — H16131 Photokeratitis, right eye: Secondary | ICD-10-CM

## 2019-05-09 DIAGNOSIS — I2510 Atherosclerotic heart disease of native coronary artery without angina pectoris: Secondary | ICD-10-CM | POA: Diagnosis not present

## 2019-05-09 NOTE — Patient Instructions (Addendum)
Ask about SHINGLES [facility]) at the pharmacy.  [place]

## 2019-05-13 ENCOUNTER — Other Ambulatory Visit: Payer: Self-pay | Admitting: Family Medicine

## 2019-05-13 NOTE — Progress Notes (Signed)
Follow-up Outpatient Visit Date: 05/14/2019  Primary Care Provider: Owens Loffler, MD [place] [address]  Chief Complaint: Follow-up coronary artery disease  HPI:  Phillip [name] is a 68 y.o. year-old male with history of coronary artery disease status post PCI to the mid LAD in the setting of unstable angina in 06/2016, Barrett's esophagus, and lower extremity venous insufficiency, who presents for follow-up of coronary artery disease.  I last saw him in 07/2017, at which time he complained of occasional neck and chest pain during stressful situations at work.  He was able to exercise regularly without symptoms.  He was enrolled in the Twilight study and was advised to switch from ticagrelor to aspirin 81 mg daily after completing the study.  We also performed an exercise tolerance test, which was low risk without evidence of ischemia.  Today, Phillip [name] reports feeling well.  He contracted COVID-19 in late August but has recovered, having had only mild symptoms.  He does not know where he was exposed to the virus.  He otherwise has been doing well since our last visit, denying chest pain, shortness of breath, palpitations, and lightheadedness.  Previously described leg edema is better.  --------------------------------------------------------------------------------------------------  Cardiovascular History & Procedures: Cardiovascular Problems:  Coronary artery disease status post PCI to the mid LAD in the setting of unstable angina (06/2016)  Chronic venous insufficiency  Risk Factors:  Known coronary artery disease, male gender, and age greater than 2  Cath/PCI:  LHC/PCI (07/14/16): LMCA normal. Ostial LAD with 25% stenosis. Mid LAD with 95% lesion. LCx with minimal diffuse disease. RCA normal. Successful PCI to the mid LAD with placement of a Promus Premier 3.0 x 24 mm drug-eluting stent.  CV Surgery:  None  EP Procedures and Devices:  None   Non-Invasive Evaluation(s):  Exercise tolerance test (08/02/2017): Low risk study without ischemia.  TTE (07/13/2016): Normal LV size and function. LVEF 55-60%. Mild AI. Trivial MR. Lipomatous hypertrophy of the intra-atrial septum. Trivial TR. Trivial PR. Normal RV size and function.  Recent CV Pertinent Labs: Lab Results  Component Value Date   CHOL 68 05/03/2019   CHOL 67 (L) 08/25/2016   HDL 24.30 (L) 05/03/2019   HDL 26 (L) 08/25/2016   LDLCALC 26 05/03/2019   LDLCALC 22 08/25/2016   TRIG 90.0 05/03/2019   CHOLHDL 3 05/03/2019   INR 1.13 07/14/2016   K 4.4 05/03/2019   BUN 18 05/03/2019   CREATININE 1.24 05/03/2019    Past medical and surgical history were reviewed and updated in EPIC.  Current Meds  Medication Sig  . Ascorbic Acid (VITAMIN C PO) Take 1 tablet by mouth daily.  Marland Kitchen aspirin EC 81 MG tablet Take 1 tablet (81 mg total) by mouth daily.  Marland Kitchen atorvastatin (LIPITOR) 10 MG tablet TAKE 1 TABLET BY MOUTH EVERY DAY  . folic acid (FOLVITE) Q000111Q MCG tablet Take 400 mcg by mouth daily.  . Multiple Vitamins-Minerals (ZINC PO) Take 1 tablet by mouth daily.  . nitroGLYCERIN (NITROSTAT) 0.4 MG SL tablet PLACE 1 TABLET UNDER THE TONGUE EVERY 5 MINUTES AS NEEDED FOR CHEST PAIN. MAX 3 DOSES  . omeprazole (PRILOSEC OTC) 20 MG tablet Take 20 mg by mouth daily.  . saw palmetto 80 MG capsule Take 80 mg by mouth daily.   . vitamin B-12 500 MCG tablet Take 1 tablet (500 mcg total) by mouth daily. (Patient taking differently: Take 500 mcg by mouth every other day. )    Allergies: Penicillins  Social History   Tobacco Use  . Smoking status: Never Smoker  . Smokeless tobacco: Never Used  Substance Use Topics  . Alcohol use: No  . Drug use: No    Family History  Problem Relation Age of Onset  . Hypertension Father   . Diabetes Father   . Heart disease Father   . Lymphoma Mother   . Alcohol abuse Paternal Grandfather     Review of Systems: A 12-system review of systems  was performed and was negative except as noted in the HPI.  --------------------------------------------------------------------------------------------------  Physical Exam: BP 120/72 (BP Location: Left Arm, Patient Position: Sitting, Cuff Size: Normal)   Pulse (!) 59   Temp (!) 97.4 F (36.3 C)   Ht 6' 0.5" (1.842 m)   Wt 211 lb 4 oz (95.8 kg)   BMI 28.26 kg/m   General: NAD. HEENT: No conjunctival pallor or scleral icterus.  Facemask in place. Neck: Supple without lymphadenopathy, thyromegaly, JVD, or HJR.  Lungs: Normal work of breathing. Clear to auscultation bilaterally without wheezes or crackles. Heart: Regular rate and rhythm without murmurs, rubs, or gallops. Non-displaced PMI. Abd: Bowel sounds present. Soft, NT/ND without hepatosplenomegaly Ext: No lower extremity edema. Radial, PT, and DP pulses are 2+ bilaterally. Skin: Warm and dry without rash.  EKG: Sinus bradycardia (heart rate 59 bpm).  Otherwise, no abnormality.  Lab Results  Component Value Date   WBC 4.1 05/03/2019   HGB 14.7 05/03/2019   HCT 44.8 05/03/2019   MCV 92.2 05/03/2019   PLT 131.0 (L) 05/03/2019    Lab Results  Component Value Date   NA 141 05/03/2019   K 4.4 05/03/2019   CL 105 05/03/2019   CO2 30 05/03/2019   BUN 18 05/03/2019   CREATININE 1.24 05/03/2019   GLUCOSE 96 05/03/2019   ALT 14 05/03/2019    Lab Results  Component Value Date   CHOL 68 05/03/2019   HDL 24.30 (L) 05/03/2019   LDLCALC 26 05/03/2019   TRIG 90.0 05/03/2019   CHOLHDL 3 05/03/2019    --------------------------------------------------------------------------------------------------  ASSESSMENT AND PLAN: Coronary artery disease: Phillip [name] is doing well from a cardiac standpoint, now almost 3 years out from his PCI to the LAD.  We will continue his current medications for secondary prevention (ASA and atorvastatin).  Hyperlipidemia: LDL very well controlled with low-dose atorvastatin.  HDL remains  somewhat low.  We will continue atorvastatin 10 mg daily.  I encouraged Phillip [name] to continue exercising, as tolerated.  Follow-up: Return to clinic in 1 year.  Nelva Bush, MD 05/15/2019 9:13 PM

## 2019-05-14 ENCOUNTER — Other Ambulatory Visit: Payer: Self-pay

## 2019-05-14 ENCOUNTER — Ambulatory Visit (INDEPENDENT_AMBULATORY_CARE_PROVIDER_SITE_OTHER): Payer: Medicare Other | Admitting: Internal Medicine

## 2019-05-14 ENCOUNTER — Encounter: Payer: Self-pay | Admitting: Internal Medicine

## 2019-05-14 VITALS — BP 120/72 | HR 59 | Temp 97.4°F | Ht 72.5 in | Wt 211.2 lb

## 2019-05-14 DIAGNOSIS — E785 Hyperlipidemia, unspecified: Secondary | ICD-10-CM | POA: Diagnosis not present

## 2019-05-14 DIAGNOSIS — I2510 Atherosclerotic heart disease of native coronary artery without angina pectoris: Secondary | ICD-10-CM

## 2019-05-14 NOTE — Patient Instructions (Signed)
Medication Instructions:  Your physician recommends that you continue on your current medications as directed. Please refer to the Current Medication list given to you today.  If you need a refill on your cardiac medications before your next appointment, please call your pharmacy.   Lab work: NONE If you have labs (blood work) drawn today and your tests are completely normal, you will receive your results only by: Marland Kitchen MyChart Message (if you have MyChart) OR . A paper copy in the mail If you have any lab test that is abnormal or we need to change your treatment, we will call you to review the results.  Testing/Procedures: NONE  Follow-Up: At [facility], you and your health needs are our priority.  As part of our continuing mission to provide you with exceptional heart care, we have created designated Provider Care Teams.  These Care Teams include your primary Cardiologist (physician) and Advanced Practice Providers (APPs -  Physician Assistants and Nurse Practitioners) who all work together to provide you with the care you need, when you need it. You will need a follow up appointment in 12 months.  Please call our office 2 months in advance to schedule this appointment.  You may see DR Harrell Gave END or one of the following Advanced Practice Providers on your designated Care Team:   Murray Hodgkins, NP Christell Faith, PA-C . Marrianne Mood, PA-C

## 2019-05-15 ENCOUNTER — Encounter: Payer: Self-pay | Admitting: Internal Medicine

## 2019-05-29 DIAGNOSIS — L570 Actinic keratosis: Secondary | ICD-10-CM | POA: Diagnosis not present

## 2019-05-29 DIAGNOSIS — D485 Neoplasm of uncertain behavior of skin: Secondary | ICD-10-CM | POA: Diagnosis not present

## 2019-05-29 DIAGNOSIS — X32XXXA Exposure to sunlight, initial encounter: Secondary | ICD-10-CM | POA: Diagnosis not present

## 2019-05-29 DIAGNOSIS — D225 Melanocytic nevi of trunk: Secondary | ICD-10-CM | POA: Diagnosis not present

## 2019-05-29 DIAGNOSIS — D2239 Melanocytic nevi of other parts of face: Secondary | ICD-10-CM | POA: Diagnosis not present

## 2019-07-16 ENCOUNTER — Encounter: Payer: Self-pay | Admitting: Gastroenterology

## 2019-09-05 ENCOUNTER — Telehealth: Payer: Self-pay | Admitting: Family Medicine

## 2019-09-05 NOTE — Telephone Encounter (Signed)
Called and spoke with Phillip [name] to verify if he received the [employer] or Moderna vaccine.  Patient believes it was [employer].  Immunization record updated.

## 2019-09-05 NOTE — Telephone Encounter (Signed)
Patient called to make sure it was in his chart that he had his first covid vaccine done on 1/15.Marland Kitchen Patient stated he does not remember the name of the place he went to have this done

## 2019-09-10 ENCOUNTER — Other Ambulatory Visit: Payer: No Typology Code available for payment source

## 2019-09-10 ENCOUNTER — Other Ambulatory Visit (INDEPENDENT_AMBULATORY_CARE_PROVIDER_SITE_OTHER): Payer: PPO

## 2019-09-10 ENCOUNTER — Other Ambulatory Visit: Payer: Self-pay

## 2019-09-10 DIAGNOSIS — R972 Elevated prostate specific antigen [PSA]: Secondary | ICD-10-CM | POA: Diagnosis not present

## 2019-09-11 LAB — PSA, TOTAL WITH REFLEX TO PSA, FREE: PSA, Total: 0.7 ng/mL (ref <=4.0)

## 2019-10-17 DIAGNOSIS — H2513 Age-related nuclear cataract, bilateral: Secondary | ICD-10-CM | POA: Diagnosis not present

## 2019-10-17 DIAGNOSIS — H35371 Puckering of macula, right eye: Secondary | ICD-10-CM | POA: Diagnosis not present

## 2019-10-17 DIAGNOSIS — H43813 Vitreous degeneration, bilateral: Secondary | ICD-10-CM | POA: Diagnosis not present

## 2020-03-11 DIAGNOSIS — H2513 Age-related nuclear cataract, bilateral: Secondary | ICD-10-CM | POA: Diagnosis not present

## 2020-06-02 ENCOUNTER — Ambulatory Visit (INDEPENDENT_AMBULATORY_CARE_PROVIDER_SITE_OTHER): Payer: PPO | Admitting: Family

## 2020-06-02 ENCOUNTER — Encounter: Payer: Self-pay | Admitting: Family

## 2020-06-02 ENCOUNTER — Other Ambulatory Visit: Payer: Self-pay

## 2020-06-02 VITALS — BP 128/74 | HR 53 | Ht 72.0 in | Wt 212.0 lb

## 2020-06-02 DIAGNOSIS — D2272 Melanocytic nevi of left lower limb, including hip: Secondary | ICD-10-CM | POA: Diagnosis not present

## 2020-06-02 DIAGNOSIS — D225 Melanocytic nevi of trunk: Secondary | ICD-10-CM | POA: Diagnosis not present

## 2020-06-02 DIAGNOSIS — R001 Bradycardia, unspecified: Secondary | ICD-10-CM

## 2020-06-02 DIAGNOSIS — D2262 Melanocytic nevi of left upper limb, including shoulder: Secondary | ICD-10-CM | POA: Diagnosis not present

## 2020-06-02 DIAGNOSIS — E785 Hyperlipidemia, unspecified: Secondary | ICD-10-CM | POA: Diagnosis not present

## 2020-06-02 DIAGNOSIS — D2261 Melanocytic nevi of right upper limb, including shoulder: Secondary | ICD-10-CM | POA: Diagnosis not present

## 2020-06-02 DIAGNOSIS — I872 Venous insufficiency (chronic) (peripheral): Secondary | ICD-10-CM | POA: Diagnosis not present

## 2020-06-02 DIAGNOSIS — D2271 Melanocytic nevi of right lower limb, including hip: Secondary | ICD-10-CM | POA: Diagnosis not present

## 2020-06-02 DIAGNOSIS — L821 Other seborrheic keratosis: Secondary | ICD-10-CM | POA: Diagnosis not present

## 2020-06-02 DIAGNOSIS — I25118 Atherosclerotic heart disease of native coronary artery with other forms of angina pectoris: Secondary | ICD-10-CM | POA: Diagnosis not present

## 2020-06-02 MED ORDER — NITROGLYCERIN 0.4 MG SL SUBL: SUBLINGUAL_TABLET | SUBLINGUAL | 2 refills | Status: DC

## 2020-06-02 NOTE — Patient Instructions (Addendum)
Medication Instructions:  No medication changes today.  *If you need a refill on your cardiac medications before your next appointment, please call your pharmacy*   Lab Work: None ordered today. Recommend scheduling your annual physical with Dr. Lorelei Pont for annual physical.   Testing/Procedures: Your EKG today showed sinus bradycardia 53 bpm.  Follow-Up: At [facility], you and your health needs are our priority.  As part of our continuing mission to provide you with exceptional heart care, we have created designated Provider Care Teams.  These Care Teams include your primary Cardiologist (physician) and Advanced Practice Providers (APPs -  Physician Assistants and Nurse Practitioners) who all work together to provide you with the care you need, when you need it.  We recommend signing up for the patient portal called "MyChart".  Sign up information is provided on this After Visit Summary.  MyChart is used to connect with patients for Virtual Visits (Telemedicine).  Patients are able to view lab/test results, encounter notes, upcoming appointments, etc.  Non-urgent messages can be sent to your provider as well.   To learn more about what you can do with MyChart, go to [email or web address].    Your next appointment:   1 year(s)  The format for your next appointment:   In Person  Provider:   You may see Nelva Bush, MD or one of the following Advanced Practice Providers on your designated Care Team:    Murray Hodgkins, NP  Christell Faith, PA-C  Marrianne Mood, PA-C  Cadence Cosby, [place]  Laurann [place], NP

## 2020-06-02 NOTE — Progress Notes (Signed)
Office Visit    Patient Name: Phillip [name] Date of Encounter: 06/02/2020  Primary Care Provider:  Owens Loffler, MD Primary Cardiologist:  Nelva Bush, MD Electrophysiologist:  None   Chief Complaint    Phillip [name] is a 69 y.o. male with a hx of CAD s/p PCI to mid LAD in setting of unstable angina 06/216, Barrett's esophagus, LE venous insufficiency, COVID-19 03/2019 not requiring hospitalization, HLD, GERD presents today for follow up of CAD   Past Medical History    Past Medical History:  Diagnosis Date  . Allergic rhinitis   . Arthritis   . CAD (coronary artery disease)    a. 06/2016 Echo: EF 55-60%, no rwma, mild AI, triv MR/TR/PR, lipomatous hypertrophy of atrial septum;  b. 06/2016 Ex MV: EF 56%, large, partially reversible anteroseptal, inferoseptal, inferior,and apical perfusion defect - borderline for ischemia (TID 1.64) - equivocal study;  c. 06/2016 PCI: LM nl, LAD 25p/74m (3.0x24 Promus Premier MR DES), LCX min irregs, RCA nl.  . Cataract      left eye   . Colon polyps    a. Severe dysplasia in a right-sided polyp removed surgically in [place], 2006--> final pathology showed this was not a cancer.  Follow up colonoscopy at one year interval was normal.  . Erectile dysfunction   . GERD (gastroesophageal reflux disease)    H/o Barrett's esophagus, orginally diagnosed in 2006, biopsies showed no dysplasia; follow up EGD also showed no dysplasia  . History of BPH   . Lyme disease    x2   Past Surgical History:  Procedure Laterality Date  . CARDIAC CATHETERIZATION N/A 07/14/2016   Procedure: Left Heart Cath and Coronary Angiography;  Surgeon: Sherren Mocha, MD;  Location: [facility];  Service: Cardiovascular;  Laterality: N/A;  . CARDIAC CATHETERIZATION N/A 07/14/2016   Procedure: Coronary Stent Intervention;  Surgeon: Sherren Mocha, MD;  Location: [facility];  Service: Cardiovascular;  Laterality: N/A;  . COLONOSCOPY  12/2008    Repeat 5 years  . ESOPHAGOGASTRODUODENOSCOPY  12/2008   Barrett's, repeat 3 years  . HEMICOLECTOMY  2006   Right, for severely dysplastic right colon polyp  . HERNIA REPAIR  2130   UMBILICAL  . LUMBAR DISC SURGERY  1992   L4-5  . TONSILLECTOMY     As a child  . TUMOR REMOVAL  2002   Fatty tumor (neck)  . UPPER GASTROINTESTINAL ENDOSCOPY    . VARICOSE VEIN SURGERY  1997   Removal, right    Allergies  Allergies  Allergen Reactions  . Penicillins     REACTION: Rash as a child - Tolerated AMOXICILLIN SEVERAL TIMES AS ADULT RECENTLY Has patient had a PCN reaction causing immediate rash, facial/tongue/throat swelling, SOB or lightheadedness with hypotension: YES Has patient had a PCN reaction causing severe rash involving mucus membranes or skin necrosis:NO Has patient had a PCN reaction that required hospitalization NO Has patient had a PCN reaction occurring within the last 10 years: NO If all of the above answers are "NO", then may proceed with     History of Present Illness    Phillip [name] is a 69 y.o. male with a hx of  CAD s/p PCI to mid LAD in setting of unstable angina 06/216, Barrett's esophagus, LE venous insufficiency, COVID-19 03/2019 not requiring hospitalization, HLD, GERD last seen 05/14/19 by Dr. Saunders Revel.  When seen 07/2017 noted occasional neck and chest pain during stressful situations.  He was exercising regularly without  anginal symptoms.  He was involved in twilight study and was advised to switch from to ticagrelor to aspirin 81 mg daily after completion.  He had normal exercise tolerance test which was low risk without evidence of ischemia.  He COVID-19 03/2019.  When seen 04/2019 for cardiology follow-up he was feeling well and noted only mild residual symptoms.  He was doing well from cardiac perspective and no changes were made.   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  He reports no chest pain,  pressure, tightness.  Reports no shortness of breath at rest nor dyspnea on exertion.  He exercises regularly at the gym as well as by golfing.  He endorses eating a low-sodium, heart healthy diet.  He has no concerns related to his heart health.  Does not monitor his heart rate routinely at home though denies lightheadedness, dizziness, near-syncope, syncope.  Notes his lower extremity edema is stable at his baseline.  EKGs/Labs/Other Studies Reviewed:   The following studies were reviewed today:  LHC/PCI (07/14/16): LMCA normal. Ostial LAD with 25% stenosis. Mid LAD with 95% lesion. LCx with minimal diffuse disease. RCA normal. Successful PCI to the mid LAD with placement of a Promus Premier 3.0 x 24 mm drug-eluting stent.   Exercise tolerance test (08/02/2017): Low risk study without ischemia.  TTE (07/13/2016): Normal LV size and function. LVEF 55-60%. Mild AI. Trivial MR. Lipomatous hypertrophy of the intra-atrial septum. Trivial TR. Trivial PR. Normal RV size and function.  EKG:  EKG is  ordered today.  The ekg ordered today demonstrates sinus bradycardia 53 bpm with no acute ST/T wave changes.  Recent Labs: No results found for requested labs within last 8760 hours.  Recent Lipid Panel    Component Value Date/Time   CHOL 68 05/03/2019 1307   CHOL 67 (L) 08/25/2016 0920   TRIG 90.0 05/03/2019 1307   HDL 24.30 (L) 05/03/2019 1307   HDL 26 (L) 08/25/2016 0920   CHOLHDL 3 05/03/2019 1307   VLDL 18.0 05/03/2019 1307   LDLCALC 26 05/03/2019 1307   LDLCALC 22 08/25/2016 0920     Home Medications   Current Meds  Medication Sig  . Ascorbic Acid (VITAMIN C PO) Take 1 tablet by mouth daily.  Marland Kitchen aspirin EC 81 MG tablet Take 1 tablet (81 mg total) by mouth daily.  Marland Kitchen atorvastatin (LIPITOR) 10 MG tablet TAKE 1 TABLET BY MOUTH EVERY DAY  . folic acid (FOLVITE) 408 MCG tablet Take 400 mcg by mouth daily.  . Multiple Vitamins-Minerals (ZINC PO) Take 1 tablet by mouth daily.  .  nitroGLYCERIN (NITROSTAT) 0.4 MG SL tablet PLACE 1 TABLET UNDER THE TONGUE EVERY 5 MINUTES AS NEEDED FOR CHEST PAIN. MAX 3 DOSES  . omeprazole (PRILOSEC OTC) 20 MG tablet Take 20 mg by mouth daily.  . saw palmetto 80 MG capsule Take 80 mg by mouth daily.   . vitamin B-12 500 MCG tablet Take 1 tablet (500 mcg total) by mouth daily. (Patient taking differently: Take 500 mcg by mouth every other day. )     Review of Systems  All other systems reviewed and are otherwise negative except as noted above.  Physical Exam    VS:  BP 128/74 (BP Location: Left Arm, Patient Position: Sitting, Cuff Size: Normal)   Pulse (!) 53   Ht 6' (1.829 m)   Wt 212 lb (96.2 kg)   SpO2 99%   BMI 28.75 kg/m  , BMI  Body mass index is 28.75 kg/m.  Wt Readings from Last 3 Encounters:  06/02/20 212 lb (96.2 kg)  05/14/19 211 lb 4 oz (95.8 kg)  05/09/19 207 lb 8 oz (94.1 kg)     GEN: Well nourished, well developed, in no acute distress. HEENT: normal. Neck: Supple, no JVD, carotid bruits, or masses. Cardiac: Bradycardic, RRR, no murmurs, rubs, or gallops. No clubbing, cyanosis.  Trace bilateral ankle edema.  Radials/PT 2+ and equal bilaterally.  Respiratory:  Respirations regular and unlabored, clear to auscultation bilaterally. GI: Soft, nontender, nondistended. MS: No deformity or atrophy. Skin: Warm and dry, no rash. Neuro:  Strength and sensation are intact. Psych: Normal affect.  Assessment & Plan    1. CAD -EKG today sinus bradycardia 53 bpm with no acute ST/T wave changes.  He denies anginal symptoms, no indication for ischemic evaluation at this time.  GDMT includes aspirin, statin, PRN nitroglycerin. Refill of PRN nitroglycerin provided.  We discussed the recommendation to continue aspirin.  Continue low-sodium, heartily diet.  Continue regular cardiovascular exercise.  2. HLD- Continue atorvastatin 10 mg daily per PCP.  He is due for repeat lipid panel and will have collected at annual physical  which he plans to schedule soon.  3. Venous insufficiency-reports lower extremity edema is at his baseline.  Nonpitting on exam.  Encourage low-sodium diet, compression stockings, elevation of lower extremities.  4. Sinus bradycardia -Asymptomatic with no lightheadedness, dizziness, near-syncope, syncope.  He is not on any rate limiting agents.  Continue to monitor with periodic EKG.  Disposition: Follow up in 1 year(s) with Dr. Saunders Revel or APP  Signed, Loel Dubonnet, NP 06/02/2020, 8:21 AM [place]

## 2020-07-18 ENCOUNTER — Telehealth: Payer: Self-pay | Admitting: Family Medicine

## 2020-07-21 NOTE — Telephone Encounter (Signed)
Left message for patient to call the office

## 2020-07-21 NOTE — Telephone Encounter (Signed)
Please schedule MWV with nurse and CPE with Dr. Copland.  °

## 2020-07-28 NOTE — Telephone Encounter (Signed)
Spoke with patient schedule MWV with nurse and CPE 

## 2020-07-31 DIAGNOSIS — H903 Sensorineural hearing loss, bilateral: Secondary | ICD-10-CM | POA: Diagnosis not present

## 2020-08-04 DIAGNOSIS — Z20822 Contact with and (suspected) exposure to covid-19: Secondary | ICD-10-CM | POA: Diagnosis not present

## 2020-08-16 HISTORY — PX: VEIN SURGERY: SHX48

## 2020-08-27 ENCOUNTER — Other Ambulatory Visit: Payer: PPO

## 2020-08-28 ENCOUNTER — Telehealth: Payer: Self-pay | Admitting: Family Medicine

## 2020-08-28 NOTE — Telephone Encounter (Signed)
Pt called in wanted to know about getting lab order in so he can go to [facility] due to he lives in [place]

## 2020-08-29 ENCOUNTER — Other Ambulatory Visit: Payer: Self-pay | Admitting: Family Medicine

## 2020-08-29 DIAGNOSIS — E785 Hyperlipidemia, unspecified: Secondary | ICD-10-CM

## 2020-08-29 DIAGNOSIS — Z125 Encounter for screening for malignant neoplasm of prostate: Secondary | ICD-10-CM

## 2020-08-29 DIAGNOSIS — Z79899 Other long term (current) drug therapy: Secondary | ICD-10-CM

## 2020-08-29 NOTE — Telephone Encounter (Signed)
Can you help me set this up - I do not know how.  FLP, E78.5  hyperlipidemia  Cbc with diff, HFP, BMET: Z79.899 long-term medication usage Medicare PSA: Z12.5 screening for prostate cancer

## 2020-08-29 NOTE — Telephone Encounter (Signed)
Lab orders have been placed. Patient can go to [facility] for fasting labs

## 2020-09-03 ENCOUNTER — Other Ambulatory Visit: Payer: PPO

## 2020-09-03 ENCOUNTER — Other Ambulatory Visit (INDEPENDENT_AMBULATORY_CARE_PROVIDER_SITE_OTHER): Payer: Medicare HMO | Admitting: Family Medicine

## 2020-09-03 ENCOUNTER — Other Ambulatory Visit: Payer: Self-pay | Admitting: Family Medicine

## 2020-09-03 ENCOUNTER — Other Ambulatory Visit: Payer: Medicare HMO

## 2020-09-03 ENCOUNTER — Other Ambulatory Visit (INDEPENDENT_AMBULATORY_CARE_PROVIDER_SITE_OTHER): Payer: Medicare HMO

## 2020-09-03 ENCOUNTER — Encounter: Payer: PPO | Admitting: Family Medicine

## 2020-09-03 DIAGNOSIS — E785 Hyperlipidemia, unspecified: Secondary | ICD-10-CM

## 2020-09-03 DIAGNOSIS — Z125 Encounter for screening for malignant neoplasm of prostate: Secondary | ICD-10-CM

## 2020-09-03 DIAGNOSIS — R7309 Other abnormal glucose: Secondary | ICD-10-CM

## 2020-09-03 DIAGNOSIS — Z79899 Other long term (current) drug therapy: Secondary | ICD-10-CM

## 2020-09-03 LAB — CBC WITH DIFFERENTIAL/PLATELET
Basophils Absolute: 0 10*3/uL (ref 0.0–0.1)
Basophils Relative: 0.7 % (ref 0.0–3.0)
Eosinophils Absolute: 0.1 10*3/uL (ref 0.0–0.7)
Eosinophils Relative: 2.2 % (ref 0.0–5.0)
HCT: 43.9 % (ref 39.0–52.0)
Hemoglobin: 14.7 g/dL (ref 13.0–17.0)
Lymphocytes Relative: 30.8 % (ref 12.0–46.0)
Lymphs Abs: 1.1 10*3/uL (ref 0.7–4.0)
MCHC: 33.6 g/dL (ref 30.0–36.0)
MCV: 91.7 fl (ref 78.0–100.0)
Monocytes Absolute: 0.4 10*3/uL (ref 0.1–1.0)
Monocytes Relative: 10.8 % (ref 3.0–12.0)
Neutro Abs: 2.1 10*3/uL (ref 1.4–7.7)
Neutrophils Relative %: 55.5 % (ref 43.0–77.0)
Platelets: 99 10*3/uL — ABNORMAL LOW (ref 150.0–400.0)
RBC: 4.78 Mil/uL (ref 4.22–5.81)
RDW: 14.5 % (ref 11.5–15.5)
WBC: 3.7 10*3/uL — ABNORMAL LOW (ref 4.0–10.5)

## 2020-09-03 LAB — BASIC METABOLIC PANEL
BUN: 16 mg/dL (ref 6–23)
CO2: 28 mEq/L (ref 19–32)
Calcium: 9.2 mg/dL (ref 8.4–10.5)
Chloride: 105 mEq/L (ref 96–112)
Creatinine, Ser: 1.13 mg/dL (ref 0.40–1.50)
GFR: 66.24 mL/min (ref >60.00)
Glucose, Bld: 104 mg/dL — ABNORMAL HIGH (ref 70–99)
Potassium: 4 mEq/L (ref 3.5–5.1)
Sodium: 139 mEq/L (ref 135–145)

## 2020-09-03 LAB — LIPID PANEL
Cholesterol: 73 mg/dL (ref 0–200)
HDL: 26.2 mg/dL — ABNORMAL LOW (ref >39.00)
LDL Cholesterol: 31 mg/dL (ref 0–99)
NonHDL: 46.83
Total CHOL/HDL Ratio: 3
Triglycerides: 79 mg/dL (ref 0.0–149.0)
VLDL: 15.8 mg/dL (ref 0.0–40.0)

## 2020-09-03 LAB — HEPATIC FUNCTION PANEL
ALT: 14 U/L (ref 0–53)
AST: 17 U/L (ref 0–37)
Albumin: 4.1 g/dL (ref 3.5–5.2)
Alkaline Phosphatase: 87 U/L (ref 39–117)
Bilirubin, Direct: 0.2 mg/dL (ref 0.0–0.3)
Total Bilirubin: 0.8 mg/dL (ref 0.2–1.2)
Total Protein: 6.3 g/dL (ref 6.0–8.3)

## 2020-09-03 LAB — HEMOGLOBIN A1C: Hgb A1c MFr Bld: 5.4 % (ref 4.6–6.5)

## 2020-09-03 LAB — PSA, MEDICARE: PSA: 0.63 ng/ml (ref 0.10–4.00)

## 2020-09-05 ENCOUNTER — Ambulatory Visit (INDEPENDENT_AMBULATORY_CARE_PROVIDER_SITE_OTHER): Payer: Medicare HMO

## 2020-09-05 DIAGNOSIS — Z0000 Encounter for general adult medical examination without abnormal findings: Secondary | ICD-10-CM | POA: Diagnosis not present

## 2020-09-05 NOTE — Progress Notes (Signed)
Subjective:   Phillip [name] is a 70 y.o. male who presents for Medicare Annual/Subsequent preventive examination.  Review of Systems: N/A     I connected with the patient today by telephone and verified that I am speaking with the correct person using two identifiers. Location patient: home Location nurse: work Persons participating in the telephone visit: patient, nurse.   I discussed the limitations, risks, security and privacy concerns of performing an evaluation and management service by telephone and the availability of in person appointments. I also discussed with the patient that there may be a patient responsible charge related to this service. The patient expressed understanding and verbally consented to this telephonic visit.        Cardiac Risk Factors include: advanced age (>82men, >78 women);male gender;Other (see comment), Risk factor comments: hyperlipidemia     Objective:    Today's Vitals   There is no height or weight on file to calculate BMI.  Advanced Directives 05/02/2019 11/18/2017 08/02/2016 07/13/2016 07/12/2016  Does Patient Have a Medical Advance Directive? No No No No No  Would patient like information on creating a medical advance directive? No - Patient declined Yes (MAU/Ambulatory/Procedural Areas - Information given) Yes (MAU/Ambulatory/Procedural Areas - Information given) No - Patient declined No - Patient declined    Current Medications (verified) Outpatient Encounter Medications as of 09/05/2020  Medication Sig  . Ascorbic Acid (VITAMIN C PO) Take 1 tablet by mouth daily.  Marland Kitchen aspirin EC 81 MG tablet Take 1 tablet (81 mg total) by mouth daily.  Marland Kitchen atorvastatin (LIPITOR) 10 MG tablet TAKE 1 TABLET BY MOUTH EVERY DAY  . folic acid (FOLVITE) 800 MCG tablet Take 400 mcg by mouth daily.  . Multiple Vitamins-Minerals (ZINC PO) Take 1 tablet by mouth daily.  . nitroGLYCERIN (NITROSTAT) 0.4 MG SL tablet PLACE 1 TABLET UNDER THE TONGUE EVERY 5 MINUTES AS  NEEDED FOR CHEST PAIN. MAX 3 DOSES  . omeprazole (PRILOSEC OTC) 20 MG tablet Take 20 mg by mouth daily.  . saw palmetto 80 MG capsule Take 80 mg by mouth daily.  . vitamin B-12 500 MCG tablet Take 1 tablet (500 mcg total) by mouth daily. (Patient taking differently: Take 500 mcg by mouth every other day.)   No facility-administered encounter medications on file as of 09/05/2020.    Allergies (verified) Penicillins   History: Past Medical History:  Diagnosis Date  . Allergic rhinitis   . Arthritis   . CAD (coronary artery disease)    a. 06/2016 Echo: EF 55-60%, no rwma, mild AI, triv MR/TR/PR, lipomatous hypertrophy of atrial septum;  b. 06/2016 Ex MV: EF 56%, large, partially reversible anteroseptal, inferoseptal, inferior,and apical perfusion defect - borderline for ischemia (TID 1.64) - equivocal study;  c. 06/2016 PCI: LM nl, LAD 25p/54m (3.0x24 Promus Premier MR DES), LCX min irregs, RCA nl.  . Cataract      left eye   . Colon polyps    a. Severe dysplasia in a right-sided polyp removed surgically in [place], 2006--> final pathology showed this was not a cancer.  Follow up colonoscopy at one year interval was normal.  . Erectile dysfunction   . GERD (gastroesophageal reflux disease)    H/o Barrett's esophagus, orginally diagnosed in 2006, biopsies showed no dysplasia; follow up EGD also showed no dysplasia  . History of BPH   . Lyme disease    x2   Past Surgical History:  Procedure Laterality Date  . CARDIAC CATHETERIZATION N/A 07/14/2016   Procedure:  Left Heart Cath and Coronary Angiography;  Surgeon: Sherren Mocha, MD;  Location: [facility];  Service: Cardiovascular;  Laterality: N/A;  . CARDIAC CATHETERIZATION N/A 07/14/2016   Procedure: Coronary Stent Intervention;  Surgeon: Sherren Mocha, MD;  Location: [facility];  Service: Cardiovascular;  Laterality: N/A;  . COLONOSCOPY  12/2008   Repeat 5 years  . ESOPHAGOGASTRODUODENOSCOPY  12/2008   Barrett's,  repeat 3 years  . HEMICOLECTOMY  2006   Right, for severely dysplastic right colon polyp  . HERNIA REPAIR  8366   UMBILICAL  . LUMBAR DISC SURGERY  1992   L4-5  . TONSILLECTOMY     As a child  . TUMOR REMOVAL  2002   Fatty tumor (neck)  . UPPER GASTROINTESTINAL ENDOSCOPY    . VARICOSE VEIN SURGERY  1997   Removal, right   Family History  Problem Relation Age of Onset  . Hypertension Father   . Diabetes Father   . Heart disease Father   . Lymphoma Mother   . Alcohol abuse Paternal Grandfather    Social History   Socioeconomic History  . Marital status: Married    Spouse name: Not on file  . Number of children: 3  . Years of education: Not on file  . Highest education level: Not on file  Occupational History  . Occupation: Theme park manager, [place]  Tobacco Use  . Smoking status: Never Smoker  . Smokeless tobacco: Never Used  Vaping Use  . Vaping Use: Never used  Substance and Sexual Activity  . Alcohol use: No  . Drug use: No  . Sexual activity: Not on file  Other Topics Concern  . Not on file  Social History Narrative   Lives in [place] with his wife.  Tax inspector.   Social Determinants of Health   Financial Resource Strain: Low Risk   . Difficulty of Paying Living Expenses: Not hard at all  Food Insecurity: No Food Insecurity  . Worried About Charity fundraiser in the Last Year: Never true  . Ran Out of Food in the Last Year: Never true  Transportation Needs: No Transportation Needs  . Lack of Transportation (Medical): No  . Lack of Transportation (Non-Medical): No  Physical Activity: Sufficiently Active  . Days of Exercise per Week: 2 days  . Minutes of Exercise per Session: 120 min  Stress: No Stress Concern Present  . Feeling of Stress : Not at all  Social Connections: Not on file    Tobacco Counseling Counseling given: Not Answered   Clinical Intake:  Pre-visit preparation completed:  Yes  Pain : No/denies pain     Nutritional Risks: None Diabetes: No  How often do you need to have someone help you when you read instructions, pamphlets, or other written materials from your doctor or pharmacy?: 1 - Never What is the last grade level you completed in school?: PhD  Diabetic: No Nutrition Risk Assessment:  Has the patient had any N/V/D within the last 2 months?  No  Does the patient have any non-healing wounds?  No  Has the patient had any unintentional weight loss or weight gain?  No   Diabetes:  Is the patient diabetic?  No  If diabetic, was a CBG obtained today?  N/A Did the patient bring in their glucometer from home?  N/A How often do you monitor your CBG's? N/A.   Financial Strains and Diabetes Management:  Are  you having any financial strains with the device, your supplies or your medication? N/A.  Does the patient want to be seen by Chronic Care Management for management of their diabetes?  N/A Would the patient like to be referred to a Nutritionist or for Diabetic Management?  N/A   Interpreter Needed?: No  Information entered by :: CJohnson, LPN   Activities of Daily Living In your present state of health, do you have any difficulty performing the following activities: 09/05/2020  Hearing? N  Vision? N  Difficulty concentrating or making decisions? N  Walking or climbing stairs? N  Dressing or bathing? N  Doing errands, shopping? N  Preparing Food and eating ? N  Using the Toilet? N  In the past six months, have you accidently leaked urine? N  Do you have problems with loss of bowel control? N  Managing your Medications? N  Managing your Finances? N  Housekeeping or managing your Housekeeping? N  Some recent data might be hidden    Patient Care Team: Owens Loffler, MD as PCP - General End, Harrell Gave, MD as PCP - Cardiology (Cardiology)  Indicate any recent Medical Services you may have received from other than [facility] providers in  the past year (date may be approximate).     Assessment:   This is a routine wellness examination for Romaine.  Hearing/Vision screen  Hearing Screening   125Hz  250Hz  500Hz  1000Hz  2000Hz  3000Hz  4000Hz  6000Hz  8000Hz   Right ear:           Left ear:           Vision Screening Comments: Patient gets annual eye exams   Dietary issues and exercise activities discussed: Current Exercise Habits: Structured exercise class, Type of exercise: strength training/weights, Time (Minutes): > 60, Frequency (Times/Week): 2, Weekly Exercise (Minutes/Week): 0, Intensity: Moderate, Exercise limited by: None identified  Goals    . Increase physical activity     Starting 11/18/2017, I will continue to exercise at gym for 90 minutes once weekly and to remain a Social research officer, government.     . Patient Stated     05/02/19, Patient wants to increase exercise and get back to the gym     . Patient Stated     09/05/2020, I will continue to go to the gym 1-2 days a week for about 1 1/2 hours.       Depression Screen PHQ 2/9 Scores 09/05/2020 05/02/2019 11/18/2017 08/02/2016  PHQ - 2 Score 0 0 0 0  PHQ- 9 Score 0 0 0 1    Fall Risk Fall Risk  09/05/2020 05/02/2019 11/18/2017 08/02/2016  Falls in the past year? 0 0 No No  Number falls in past yr: 0 0 - -  Injury with Fall? 0 - - -  Risk for fall due to : No Fall Risks Medication side effect - -  Follow up Falls evaluation completed;Falls prevention discussed Falls evaluation completed;Falls prevention discussed - -    FALL RISK PREVENTION PERTAINING TO THE HOME:  Any stairs in or around the home? Yes  If so, are there any without handrails? No  Home free of loose throw rugs in walkways, pet beds, electrical cords, etc? Yes  Adequate lighting in your home to reduce risk of falls? Yes   ASSISTIVE DEVICES UTILIZED TO PREVENT FALLS:  Life alert? No  Use of a cane, walker or w/c? No  Grab bars in the bathroom? No  Shower chair or bench in shower? No  Elevated toilet  seat or a handicapped toilet? No   TIMED UP AND GO:  Was the test performed? N/A telephone visit .    Cognitive Function: MMSE - Mini Mental State Exam 09/05/2020 05/02/2019 11/18/2017  Not completed: Refused - -  Orientation to time - 5 5  Orientation to Place - 5 5  Registration - 3 3  Attention/ Calculation - 5 0  Recall - 3 3  Language- name 2 objects - - 0  Language- repeat - 1 1  Language- follow 3 step command - - 3  Language- read & follow direction - - 0  Write a sentence - - 0  Copy design - - 0  Total score - - 20  Normal cognitive status assessed by direct observation by this Nurse Health Advisor. No abnormalities found.    Mini Cog  Mini-Cog screen was not completed. Patient did not feel the need to do this.  Maximum score is 22. A value of 0 denotes this part of the MMSE was not completed or the patient failed this part of the Mini-Cog screening.       Immunizations Immunization History  Administered Date(s) Administered  . Fluad Quad(high Dose 65+) 05/09/2019  . H1N1 09/26/2008  . Influenza Whole 09/26/2008  . Influenza, High Dose Seasonal PF 05/31/2017, 06/14/2018  . Influenza,inj,Quad PF,6+ Mos 05/30/2014, 06/07/2015, 05/10/2016  . Influenza-Unspecified 05/16/2020  . PFIZER(Purple Top)SARS-COV-2 Vaccination 08/31/2019, 09/21/2019, 08/05/2020  . Pneumococcal Conjugate-13 07/22/2016  . Pneumococcal Polysaccharide-23 08/17/2007, 11/18/2017  . Td 12/16/2008    TDAP status: Due, Education has been provided regarding the importance of this vaccine. Advised may receive this vaccine at local pharmacy or Health Dept. Aware to provide a copy of the vaccination record if obtained from local pharmacy or Health Dept. Verbalized acceptance and understanding.  Flu Vaccine status: Up to date  Pneumococcal vaccine status: Up to date  Covid-19 vaccine status: Completed vaccines  Qualifies for Shingles Vaccine? Yes   Zostavax completed No   Shingrix Completed?: No.     Education has been provided regarding the importance of this vaccine. Patient has been advised to call insurance company to determine out of pocket expense if they have not yet received this vaccine. Advised may also receive vaccine at local pharmacy or Health Dept. Verbalized acceptance and understanding.  Screening Tests Health Maintenance  Topic Date Due  . TETANUS/TDAP  09/05/2021 (Originally 12/17/2018)  . COLONOSCOPY (Pts 45-96yrs Insurance coverage will need to be confirmed)  05/25/2023  . INFLUENZA VACCINE  Completed  . COVID-19 Vaccine  Completed  . Hepatitis C Screening  Completed  . PNA vac Low Risk Adult  Completed    Health Maintenance  There are no preventive care reminders to display for this patient.  Colorectal cancer screening: Type of screening: Colonoscopy. Completed 05/24/2018. Repeat every 5 years  Lung Cancer Screening: (Low Dose CT Chest recommended if Age 59-80 years, 30 pack-year currently smoking OR have quit w/in 15years.) does not qualify.  Additional Screening:  Hepatitis C Screening: does qualify; Completed 11/18/2017  Vision Screening: Recommended annual ophthalmology exams for early detection of glaucoma and other disorders of the eye. Is the patient up to date with their annual eye exam?  Yes  Who is the provider or what is the name of the office in which the patient attends annual eye exams? Dr. Lucious Groves, [facility]  If pt is not established with a provider, would they like to be referred to a provider to establish care? No .  Dental Screening: Recommended annual dental exams for proper oral hygiene  Community Resource Referral / Chronic Care Management: CRR required this visit?  No   CCM required this visit?  No      Plan:     I have personally reviewed and noted the following in the patient's chart:   . Medical and social history . Use of alcohol, tobacco or illicit drugs  . Current medications and supplements . Functional  ability and status . Nutritional status . Physical activity . Advanced directives . List of other physicians . Hospitalizations, surgeries, and ER visits in previous 12 months . Vitals . Screenings to include cognitive, depression, and falls . Referrals and appointments  In addition, I have reviewed and discussed with patient certain preventive protocols, quality metrics, and best practice recommendations. A written personalized care plan for preventive services as well as general preventive health recommendations were provided to patient.   Due to this being a telephonic visit, the after visit summary with patients personalized plan was offered to patient via office or my-chart. Patient preferred to pick up at office at next visit or via mychart.   [place], [place], LPN   [phone]

## 2020-09-05 NOTE — Progress Notes (Signed)
PCP notes:  Health Maintenance: Tdap- insurance    Abnormal Screenings: none   Patient concerns: Wants to have his toenails assessed at physical    Nurse concerns: none   Next PCP appt: 09/10/2020 @ 9 am

## 2020-09-05 NOTE — Patient Instructions (Signed)
[name] , Thank you for taking time to come for your Medicare Wellness Visit. I appreciate your ongoing commitment to your health goals. Please review the following plan we discussed and let me know if I can assist you in the future.   Screening recommendations/referrals: Colonoscopy: Up to date, completed 05/24/2018 due 05/2023 Recommended yearly ophthalmology/optometry visit for glaucoma screening and checkup Recommended yearly dental visit for hygiene and checkup  Vaccinations: Influenza vaccine: Up to date, completed 05/16/2020, due 03/2021 Pneumococcal vaccine: Completed series Tdap vaccine: decline-insurance Shingles vaccine: due, check with your insurance regarding coverage if interested    Covid-19: .comsp  Advanced directives: Advance directive discussed with you today. Even though you declined this today please call our office should you change your mind and we can give you the proper paperwork for you to fill out.  Conditions/risks identified: hyperlipidemia  Next appointment: Follow up in one year for your annual wellness visit.   Preventive Care 70 Years and Older, Male Preventive care refers to lifestyle choices and visits with your health care provider that can promote health and wellness. What does preventive care include?  A yearly physical exam. This is also called an annual well check.  Dental exams once or twice a year.  Routine eye exams. Ask your health care provider how often you should have your eyes checked.  Personal lifestyle choices, including:  Daily care of your teeth and gums.  Regular physical activity.  Eating a healthy diet.  Avoiding tobacco and drug use.  Limiting alcohol use.  Practicing safe sex.  Taking low doses of aspirin every day.  Taking vitamin and mineral supplements as recommended by your health care provider. What happens during an annual well check? The services and screenings done by your health care provider during  your annual well check will depend on your age, overall health, lifestyle risk factors, and family history of disease. Counseling  Your health care provider may ask you questions about your:  Alcohol use.  Tobacco use.  Drug use.  Emotional well-being.  Home and relationship well-being.  Sexual activity.  Eating habits.  History of falls.  Memory and ability to understand (cognition).  Work and work Statistician. Screening  You may have the following tests or measurements:  Height, weight, and BMI.  Blood pressure.  Lipid and cholesterol levels. These may be checked every 5 years, or more frequently if you are over 27 years old.  Skin check.  Lung cancer screening. You may have this screening every year starting at age 62 if you have a 30-pack-year history of smoking and currently smoke or have quit within the past 15 years.  Fecal occult blood test (FOBT) of the stool. You may have this test every year starting at age 32.  Flexible sigmoidoscopy or colonoscopy. You may have a sigmoidoscopy every 5 years or a colonoscopy every 10 years starting at age 63.  Prostate cancer screening. Recommendations will vary depending on your family history and other risks.  Hepatitis C blood test.  Hepatitis B blood test.  Sexually transmitted disease (STD) testing.  Diabetes screening. This is done by checking your blood sugar (glucose) after you have not eaten for a while (fasting). You may have this done every 1-3 years.  Abdominal aortic aneurysm (AAA) screening. You may need this if you are a current or former smoker.  Osteoporosis. You may be screened starting at age 73 if you are at high risk. Talk with your health care provider about your test  results, treatment options, and if necessary, the need for more tests. Vaccines  Your health care provider may recommend certain vaccines, such as:  Influenza vaccine. This is recommended every year.  Tetanus, diphtheria, and  acellular pertussis (Tdap, Td) vaccine. You may need a Td booster every 10 years.  Zoster vaccine. You may need this after age 63.  Pneumococcal 13-valent conjugate (PCV13) vaccine. One dose is recommended after age 41.  Pneumococcal polysaccharide (PPSV23) vaccine. One dose is recommended after age 56. Talk to your health care provider about which screenings and vaccines you need and how often you need them. This information is not intended to replace advice given to you by your health care provider. Make sure you discuss any questions you have with your health care provider. Document Released: 08/29/2015 Document Revised: 04/21/2016 Document Reviewed: 06/03/2015 Elsevier Interactive Patient Education  2017 [employer] Prevention in the Home Falls can cause injuries. They can happen to people of all ages. There are many things you can do to make your home safe and to help prevent falls. What can I do on the outside of my home?  Regularly fix the edges of walkways and driveways and fix any cracks.  Remove anything that might make you trip as you walk through a door, such as a raised step or threshold.  Trim any bushes or trees on the path to your home.  Use bright outdoor lighting.  Clear any walking paths of anything that might make someone trip, such as rocks or tools.  Regularly check to see if handrails are loose or broken. Make sure that both sides of any steps have handrails.  Any raised decks and porches should have guardrails on the edges.  Have any leaves, snow, or ice cleared regularly.  Use sand or salt on walking paths during winter.  Clean up any spills in your garage right away. This includes oil or grease spills. What can I do in the bathroom?  Use night lights.  Install grab bars by the toilet and in the tub and shower. Do not use towel bars as grab bars.  Use non-skid mats or decals in the tub or shower.  If you need to sit down in the shower, use  a plastic, non-slip stool.  Keep the floor dry. Clean up any water that spills on the floor as soon as it happens.  Remove soap buildup in the tub or shower regularly.  Attach bath mats securely with double-sided non-slip rug tape.  Do not have throw rugs and other things on the floor that can make you trip. What can I do in the bedroom?  Use night lights.  Make sure that you have a light by your bed that is easy to reach.  Do not use any sheets or blankets that are too big for your bed. They should not hang down onto the floor.  Have a firm chair that has side arms. You can use this for support while you get dressed.  Do not have throw rugs and other things on the floor that can make you trip. What can I do in the kitchen?  Clean up any spills right away.  Avoid walking on wet floors.  Keep items that you use a lot in easy-to-reach places.  If you need to reach something above you, use a strong step stool that has a grab bar.  Keep electrical cords out of the way.  Do not use floor polish or wax that makes  floors slippery. If you must use wax, use non-skid floor wax.  Do not have throw rugs and other things on the floor that can make you trip. What can I do with my stairs?  Do not leave any items on the stairs.  Make sure that there are handrails on both sides of the stairs and use them. Fix handrails that are broken or loose. Make sure that handrails are as long as the stairways.  Check any carpeting to make sure that it is firmly attached to the stairs. Fix any carpet that is loose or worn.  Avoid having throw rugs at the top or bottom of the stairs. If you do have throw rugs, attach them to the floor with carpet tape.  Make sure that you have a light switch at the top of the stairs and the bottom of the stairs. If you do not have them, ask someone to add them for you. What else can I do to help prevent falls?  Wear shoes that:  Do not have high heels.  Have  rubber bottoms.  Are comfortable and fit you well.  Are closed at the toe. Do not wear sandals.  If you use a stepladder:  Make sure that it is fully opened. Do not climb a closed stepladder.  Make sure that both sides of the stepladder are locked into place.  Ask someone to hold it for you, if possible.  Clearly mark and make sure that you can see:  Any grab bars or handrails.  First and last steps.  Where the edge of each step is.  Use tools that help you move around (mobility aids) if they are needed. These include:  Canes.  Walkers.  Scooters.  Crutches.  Turn on the lights when you go into a dark area. Replace any light bulbs as soon as they burn out.  Set up your furniture so you have a clear path. Avoid moving your furniture around.  If any of your floors are uneven, fix them.  If there are any pets around you, be aware of where they are.  Review your medicines with your doctor. Some medicines can make you feel dizzy. This can increase your chance of falling. Ask your doctor what other things that you can do to help prevent falls. This information is not intended to replace advice given to you by your health care provider. Make sure you discuss any questions you have with your health care provider. Document Released: 05/29/2009 Document Revised: 01/08/2016 Document Reviewed: 09/06/2014 Elsevier Interactive Patient Education  2017 [employer].

## 2020-09-10 ENCOUNTER — Encounter: Payer: PPO | Admitting: Family Medicine

## 2020-09-11 ENCOUNTER — Other Ambulatory Visit: Payer: Self-pay

## 2020-09-11 ENCOUNTER — Encounter: Payer: Self-pay | Admitting: Family Medicine

## 2020-09-11 ENCOUNTER — Ambulatory Visit (INDEPENDENT_AMBULATORY_CARE_PROVIDER_SITE_OTHER): Payer: Medicare HMO | Admitting: Family Medicine

## 2020-09-11 VITALS — BP 110/60 | HR 59 | Temp 97.4°F | Ht 72.5 in | Wt 214.2 lb

## 2020-09-11 DIAGNOSIS — Z0000 Encounter for general adult medical examination without abnormal findings: Secondary | ICD-10-CM | POA: Diagnosis not present

## 2020-09-11 NOTE — Progress Notes (Signed)
Chantrice Hagg T. Jaquaya Coyle, MD, [place] at [facility] [place], [address]  Phone: [phone]   FAX: [place] - 70 y.o. male   MRN [id number]   Date of Birth: August 10, 1951  Date: 09/11/2020   PCP: Owens Loffler, MD   Referral: Owens Loffler, MD  Chief Complaint  Patient presents with   Annual Exam    Part 2    This visit occurred during the SARS-CoV-2 public health emergency.  Safety protocols were in place, including screening questions prior to the visit, additional usage of staff PPE, and extensive cleaning of exam room while observing appropriate contact time as indicated for disinfecting solutions.   Patient Care Team: Owens Loffler, MD as PCP - General End, Harrell Gave, MD as PCP - Cardiology (Cardiology) Subjective:   Phillip [name] is a 70 y.o. pleasant patient who presents with the following:  Preventative Health Maintenance Visit:  Health Maintenance Summary Reviewed and updated, unless pt declines services.  Tobacco History Reviewed. Alcohol: No concerns, no excessive use Exercise Habits: golf once a week, going to the gym about once or twice STD concerns: no risk or activity to increase risk Drug Use: None  Moved to [place].   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  L foot and ankle - intermitteent pain  Health Maintenance  Topic Date Due   TETANUS/TDAP  09/05/2021 (Originally 12/17/2018)   COLONOSCOPY (Pts 45-56yr Insurance coverage will need to be confirmed)  05/25/2023   INFLUENZA VACCINE  Completed   COVID-19 Vaccine  Completed   Hepatitis C Screening  Completed   PNA vac Low Risk Adult  Completed   Immunization History  Administered Date(s) Administered   Fluad Quad(high Dose 70+) 05/09/2019   H1N1 09/26/2008   Influenza Whole 09/26/2008   Influenza, High Dose Seasonal PF  05/31/2017, 06/14/2018   Influenza,inj,Quad PF,6+ Mos 05/30/2014, 06/07/2015, 05/10/2016   Influenza-Unspecified 05/16/2020   PFIZER(Purple Top)SARS-COV-2 Vaccination 08/31/2019, 09/21/2019, 08/05/2020   Pneumococcal Conjugate-13 07/22/2016   Pneumococcal Polysaccharide-23 08/17/2007, 11/18/2017   Td 12/16/2008   Patient Active Problem List   Diagnosis Date Noted   Hyperlipidemia LDL goal <70 07/20/2017   Coronary artery disease involving native coronary artery of native heart without angina pectoris 11/02/2016   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 06/2016 Echo: EF 55-60%, no rwma, mild AI, triv MR/TR/PR, lipomatous hypertrophy of atrial septum;  b. 06/2016 Ex MV: EF 56%, large, partially reversible anteroseptal, inferoseptal, inferior,and apical perfusion defect - borderline for ischemia (TID 1.64) - equivocal study;  c. 06/2016 PCI: LM nl, LAD 25p/[id number] Promus Premier MR DES), LCX min irregs, RCA nl.   Cataract      left eye    Colon polyps    a. Severe dysplasia in [place] final pathology showed this was not a cancer.  Follow up colonoscopy at one year interval was normal.   Erectile dysfunction    GERD (  gastroesophageal reflux disease)    H/o Barrett's esophagus, orginally diagnosed in 2006, biopsies showed no dysplasia; follow up EGD also showed no dysplasia   History of BPH    Lyme disease    x2    Past Surgical History:  Procedure Laterality Date   CARDIAC CATHETERIZATION N/A 07/14/2016    Procedure: Left Heart Cath and Coronary Angiography;  Surgeon: Sherren Mocha, MD;  Location: [place] [facility];  Service: Cardiovascular;  Laterality: N/A;   CARDIAC CATHETERIZATION N/A 07/14/2016   Procedure: Coronary Stent Intervention;  Surgeon: Sherren Mocha, MD;  Location: [place] [facility];  Service: Cardiovascular;  Laterality: N/A;   COLONOSCOPY  12/2008   Repeat 5 years   ESOPHAGOGASTRODUODENOSCOPY  12/2008   Barrett's, repeat 3 years   HEMICOLECTOMY  2006   Right, for severely dysplastic right colon polyp   HERNIA REPAIR  9924   UMBILICAL   LUMBAR [place]   L4-5   TONSILLECTOMY     As a child   TUMOR REMOVAL  2002   Fatty tumor (neck)   UPPER GASTROINTESTINAL ENDOSCOPY     VARICOSE VEIN SURGERY  1997   Removal, right    Family History  Problem Relation Age of Onset   Hypertension Father    Diabetes Father    Heart disease Father    Lymphoma Mother    Alcohol abuse Paternal Grandfather     Past Medical History, Surgical History, Social History, Family History, Problem List, Medications, and Allergies have been reviewed and updated if relevant.  Review of Systems: Pertinent positives are listed above.  Otherwise, a full 14 point review of systems has been done in full and it is negative except where it is noted positive.  Objective:   BP 110/60    Pulse (!) 59    Temp (!) 97.4 F (36.3 C) (Temporal)    Ht 6' 0.5" (1.842 m)    Wt 214 lb 4 oz (97.2 kg)    SpO2 99%    BMI 28.66 kg/m  Ideal Body Weight: Weight in (lb) to have BMI = 25: 186.5  Ideal Body Weight: Weight in (lb) to have BMI = 25: 186.5 No exam data present Depression screen [facility] 2/9 09/05/2020 05/02/2019 11/18/2017 10/19/2016 08/02/2016  Decreased Interest 0 0 0 - 0  Down, Depressed, Hopeless 0 0 0 - 0  PHQ - 2 Score 0 0 0 - 0  Altered sleeping 0 0 0 0 0  Tired, decreased energy 0 0 0 1 1  Change in appetite 0 0 0 0 0  Feeling bad or failure about yourself  0 0 0 0 0   Trouble concentrating 0 0 0 0 0  Moving slowly or fidgety/restless 0 0 0 0 0  Suicidal thoughts 0 0 0 0 0  PHQ-9 Score 0 0 0 - 1  Difficult doing work/chores Not difficult at all Not difficult at all Not difficult at all Not difficult at all Not difficult at all     GEN: well developed, well nourished, no acute distress Eyes: conjunctiva and lids normal, PERRLA, EOMI ENT: TM clear, nares clear, oral exam WNL Neck: supple, no lymphadenopathy, no thyromegaly, no JVD Pulm: clear to auscultation and percussion, respiratory effort normal CV: regular rate and rhythm, S1-S2, no murmur, rub or gallop, no bruits, peripheral pulses normal and symmetric, no cyanosis, clubbing, edema or varicosities GI: soft, non-tender; no hepatosplenomegaly, masses; active bowel sounds all quadrants GU: deferred Lymph: no cervical, axillary or  inguinal adenopathy MSK: gait normal, muscle tone and strength WNL, no joint swelling, effusions, discoloration, crepitus  SKIN: clear, good turgor, color WNL, no rashes, lesions, or ulcerations Neuro: normal mental status, normal strength, sensation, and motion Psych: alert; oriented to person, place and time, normally interactive and not anxious or depressed in appearance.  All labs reviewed with patient. Lab Review:  CBC EXTENDED Latest Ref Rng & Units 09/03/2020 05/03/2019 11/18/2017  WBC 4.0 - 10.5 K/uL 3.7(L) 4.1 3.6(L)  RBC 4.22 - 5.81 Mil/uL 4.78 4.86 4.92  HGB 13.0 - 17.0 g/dL 14.7 14.7 14.5  HCT 39.0 - 52.0 % 43.9 44.8 42.9  PLT 150.0 - 400.0 K/uL 99.0(L) 131.0(L) 111.0(L)  NEUTROABS 1.4 - 7.7 K/uL 2.1 2.5 1.9  LYMPHSABS 0.7 - 4.0 K/uL 1.1 1.2 1.3    BMP Latest Ref Rng & Units 09/03/2020 05/03/2019 11/18/2017  Glucose 70 - 99 mg/dL 104(H) 96 95  BUN 6 - 23 mg/dL 16 18 15   Creatinine 0.40 - 1.50 mg/dL 1.13 1.24 1.18  Sodium 135 - 145 mEq/L 139 141 140  Potassium 3.5 - 5.1 mEq/L 4.0 4.4 4.9  Chloride 96 - 112 mEq/L 105 105 105  CO2 19 - 32 mEq/L 28 30 29    Calcium 8.4 - 10.5 mg/dL 9.2 9.5 9.3    Hepatic Function Latest Ref Rng & Units 09/03/2020 05/03/2019 11/18/2017  Total Protein 6.0 - 8.3 g/dL 6.3 6.6 6.7  Albumin 3.5 - 5.2 g/dL 4.1 4.1 4.0  AST 0 - 37 U/L 17 16 20   ALT 0 - 53 U/L 14 14 16   Alk Phosphatase 39 - 117 U/L 87 97 97  Total Bilirubin 0.2 - 1.2 mg/dL 0.8 0.9 0.8  Bilirubin, Direct 0.0 - 0.3 mg/dL 0.2 - 0.2    Lab Results  Component Value Date   CHOL 73 09/03/2020   Lab Results  Component Value Date   HDL 26.20 (L) 09/03/2020   Lab Results  Component Value Date   LDLCALC 31 09/03/2020   Lab Results  Component Value Date   TRIG 79.0 09/03/2020   Lab Results  Component Value Date   CHOLHDL 3 09/03/2020   No results for input(s): PSA in the last 72 hours. No results found for: HCVAB Lab Results  Component Value Date   VD25OH 64.47 05/03/2019   VD25OH 31.08 07/21/2015     Lab Results  Component Value Date   HGBA1C 5.4 09/03/2020   HGBA1C 5.5 11/18/2017   HGBA1C 5.6 07/14/2016   Lab Results  Component Value Date   LDLCALC 31 09/03/2020   CREATININE 1.13 09/03/2020    Assessment and Plan:     ICD-10-CM   1. Healthcare maintenance  Z00.00    Globally, he is doing well. Continue to be active, eat well, and exercise.  Health Maintenance Exam: The patient's preventative maintenance and recommended screening tests for an annual wellness exam were reviewed in full today. Brought up to date unless services declined.  Counselled on the importance of diet, exercise, and its role in overall health and mortality. The patient's FH and SH was reviewed, including their home life, tobacco status, and drug and alcohol status.  Follow-up in 1 year for physical exam or additional follow-up below.  Follow-up: No follow-ups on file. Or follow-up in 1 year if not noted.  No orders of the defined types were placed in this encounter.  There are no discontinued medications. No orders of the defined types were  placed in this encounter.   Signed,  Alex Mcmanigal T. Nevan Creighton, MD   Allergies as of 09/11/2020      Reactions   Penicillins    REACTION: Rash as a child - Tolerated AMOXICILLIN SEVERAL TIMES AS ADULT RECENTLY Has patient had a PCN reaction causing immediate rash, facial/tongue/throat swelling, SOB or lightheadedness with hypotension: YES Has patient had a PCN reaction causing severe rash involving mucus membranes or skin necrosis:NO Has patient had a PCN reaction that required hospitalization NO Has patient had a PCN reaction occurring within the last 10 years: NO If all of the above answers are "NO", then may proceed with       Medication List       Accurate as of September 11, 2020 12:02 PM. If you have any questions, ask your nurse or doctor.        aspirin EC 81 MG tablet Take 1 tablet (81 mg total) by mouth daily.   atorvastatin 10 MG tablet Commonly known as: LIPITOR TAKE 1 TABLET BY MOUTH EVERY DAY   folic acid 761 MCG tablet Commonly known as: FOLVITE Take 400 mcg by mouth daily.   nitroGLYCERIN 0.4 MG SL tablet Commonly known as: NITROSTAT PLACE 1 TABLET UNDER THE TONGUE EVERY 5 MINUTES AS NEEDED FOR CHEST PAIN. MAX 3 DOSES   omeprazole 20 MG tablet Commonly known as: PRILOSEC OTC Take 20 mg by mouth daily.   saw palmetto 80 MG capsule Take 80 mg by mouth daily.   vitamin B-12 500 MCG tablet Commonly known as: CYANOCOBALAMIN Take 1 tablet (500 mcg total) by mouth daily. What changed: when to take this   VITAMIN C PO Take 1 tablet by mouth daily.   ZINC PO Take 1 tablet by mouth daily.

## 2020-10-12 ENCOUNTER — Other Ambulatory Visit: Payer: Self-pay | Admitting: Family Medicine

## 2020-10-16 ENCOUNTER — Telehealth: Payer: Self-pay

## 2020-10-16 NOTE — Telephone Encounter (Signed)
Pt'swife left a message on triage line that pt cut himself working on a tractor and he is in need of a tetanus vaccine. She wanted [place] to send an order the the pharmacy. I called back and spoke to the pt. Advised him he did not need an order for it. [place] in [place] but asked if we had availability today or tomorrow, but we do not. [facility]. Will call back if he has an issue.

## 2020-10-20 DIAGNOSIS — H43813 Vitreous degeneration, bilateral: Secondary | ICD-10-CM | POA: Diagnosis not present

## 2020-10-30 ENCOUNTER — Telehealth: Payer: Self-pay

## 2020-10-30 MED ORDER — ATORVASTATIN CALCIUM 10 MG PO TABS: 10.0000 mg | ORAL_TABLET | Freq: Every day | ORAL | 2 refills | Status: DC

## 2020-10-30 NOTE — Telephone Encounter (Signed)
Pres[facility] to [facility] with 2 refills for next refill.

## 2020-10-30 NOTE — Telephone Encounter (Signed)
[employer] caled to say pt can now get 100 days of an rx instead of 90 days. He just recently picked up his atorvastatin. But, he is wanting the rx to be changed to 100 days for the next time he gets it. [facility]

## 2020-11-26 DIAGNOSIS — K219 Gastro-esophageal reflux disease without esophagitis: Secondary | ICD-10-CM | POA: Diagnosis not present

## 2020-11-26 DIAGNOSIS — Z8249 Family history of ischemic heart disease and other diseases of the circulatory system: Secondary | ICD-10-CM | POA: Diagnosis not present

## 2020-11-26 DIAGNOSIS — Z833 Family history of diabetes mellitus: Secondary | ICD-10-CM | POA: Diagnosis not present

## 2020-11-26 DIAGNOSIS — I2510 Atherosclerotic heart disease of native coronary artery without angina pectoris: Secondary | ICD-10-CM | POA: Diagnosis not present

## 2020-11-26 DIAGNOSIS — N529 Male erectile dysfunction, unspecified: Secondary | ICD-10-CM | POA: Diagnosis not present

## 2020-11-26 DIAGNOSIS — M1990 Unspecified osteoarthritis, unspecified site: Secondary | ICD-10-CM | POA: Diagnosis not present

## 2020-11-26 DIAGNOSIS — Z809 Family history of malignant neoplasm, unspecified: Secondary | ICD-10-CM | POA: Diagnosis not present

## 2020-11-26 DIAGNOSIS — Z880 Allergy status to penicillin: Secondary | ICD-10-CM | POA: Diagnosis not present

## 2020-11-26 DIAGNOSIS — E785 Hyperlipidemia, unspecified: Secondary | ICD-10-CM | POA: Diagnosis not present

## 2021-06-01 NOTE — Progress Notes (Signed)
Cardiology Office Note:    Date:  06/02/2021   ID:  Tad Moore, DOB August 06, 1951, MRN [id number]  PCP:  Owens Loffler, MD  [facility] Cardiologist:  Nelva Bush, MD  [facility] Electrophysiologist:  None   Referring MD: Owens Loffler, MD   Chief Complaint: 109-month follow-up  History of Present Illness:    Phillip [name] is a 70 y.o. male with a hx of CAD s/p PCI to mid LAD in setting of unstable angina 06/216, Barrett's esophagus, LE venous insufficiency, COVID-19 03/2019 not requiring hospitalization, HLD, GERD presents today for 12 month follow-up.   When seen 07/2017 noted occasional neck and chest pain during stressful situations.  He was exercising regularly without anginal symptoms.  He was involved in twilight study and was advised to switch from to ticagrelor to aspirin 81 mg daily after completion.  He had normal exercise tolerance test which was low risk without evidence of ischemia.   He COVID-19 03/2019.  When seen 04/2019 for cardiology follow-up he was feeling well and noted only mild residual symptoms.  He was doing well from cardiac perspective and no changes were made.   Patient was last seen 06/02/2020 was stable from a cardiac perspective.  No changes were made.  Today, the patient has no concerns.EKG shows heart rate 52bpm. He denies any symptoms with this. Generally it's in the 64s. Not on any rate lowering medication. No chest pain or shortness of breath, LLE, orthopnea, palpitations. Not on any BP meds.Would not add BB with heart rate. Patient is active. Diet is genrelaly good.   Past Medical History:  Diagnosis Date   Allergic rhinitis    Arthritis    CAD (coronary artery disease)    a. 06/2016 Echo: EF 55-60%, no rwma, mild AI, triv MR/TR/PR, lipomatous hypertrophy of atrial septum;  b. 06/2016 Ex MV: EF 56%, large, partially reversible anteroseptal, inferoseptal, inferior,and apical perfusion defect - borderline for ischemia (TID 1.64) -  equivocal study;  c. 06/2016 PCI: LM nl, LAD 25p/73m (3.0x24 Promus Premier MR DES), LCX min irregs, RCA nl.   Cataract      left eye    Colon polyps    a. Severe dysplasia in a right-sided polyp removed surgically in [place], 2006--> final pathology showed this was not a cancer.  Follow up colonoscopy at one year interval was normal.   Erectile dysfunction    GERD (gastroesophageal reflux disease)    H/o Barrett's esophagus, orginally diagnosed in 2006, biopsies showed no dysplasia; follow up EGD also showed no dysplasia   History of BPH    Lyme disease    x2    Past Surgical History:  Procedure Laterality Date   CARDIAC CATHETERIZATION N/A 07/14/2016   Procedure: Left Heart Cath and Coronary Angiography;  Surgeon: Sherren Mocha, MD;  Location: [place] [facility];  Service: Cardiovascular;  Laterality: N/A;   CARDIAC CATHETERIZATION N/A 07/14/2016   Procedure: Coronary Stent Intervention;  Surgeon: Sherren Mocha, MD;  Location: [place] [facility];  Service: Cardiovascular;  Laterality: N/A;   COLONOSCOPY  12/2008   Repeat 5 years   ESOPHAGOGASTRODUODENOSCOPY  12/2008   Barrett's, repeat 3 years   HEMICOLECTOMY  2006   Right, for severely dysplastic right colon polyp   HERNIA REPAIR  8768   UMBILICAL   LUMBAR [place]   L4-5   TONSILLECTOMY     As a child   TUMOR REMOVAL  2002   Fatty tumor (neck)   UPPER  GASTROINTESTINAL ENDOSCOPY     VARICOSE VEIN SURGERY  1997   Removal, right    Current Medications: Current Meds  Medication Sig   Ascorbic Acid (VITAMIN C PO) Take 1 tablet by mouth daily.   aspirin EC 81 MG tablet Take 1 tablet (81 mg total) by mouth daily.   atorvastatin (LIPITOR) 10 MG tablet Take 1 tablet (10 mg total) by mouth daily.   Coenzyme Q10 (CO Q-10 PO) Take 1 tablet by mouth daily.   folic acid (FOLVITE) 433 MCG tablet Take 400 mcg by mouth daily.   Multiple Vitamins-Minerals (ZINC PO) Take 1 tablet by mouth daily.   nitroGLYCERIN  (NITROSTAT) 0.4 MG SL tablet PLACE 1 TABLET UNDER THE TONGUE EVERY 5 MINUTES AS NEEDED FOR CHEST PAIN. MAX 3 DOSES   omeprazole (PRILOSEC OTC) 20 MG tablet Take 20 mg by mouth daily.   saw palmetto 80 MG capsule Take 80 mg by mouth daily.   vitamin B-12 500 MCG tablet Take 1 tablet (500 mcg total) by mouth daily.     Allergies:   Penicillins   Social History   Socioeconomic History   Marital status: Married    Spouse name: Not on file   Number of children: 3   Years of education: Not on file   Highest education level: Not on file  Occupational History   Occupation: Theme park manager, Personnel officer    Employer: [employer]  Tobacco Use   Smoking status: Never   Smokeless tobacco: Never  Vaping Use   Vaping Use: Never used  Substance and Sexual Activity   Alcohol use: No   Drug use: No   Sexual activity: Not on file  Other Topics Concern   Not on file  Social History Narrative   Lives in [place] with his wife.  Tax inspector.   Social Determinants of Health   Financial Resource Strain: Low Risk    Difficulty of Paying Living Expenses: Not hard at all  Food Insecurity: No Food Insecurity   Worried About Charity fundraiser in the Last Year: Never true   [place] in the Last Year: Never true  Transportation Needs: No Transportation Needs   Lack of Transportation (Medical): No   Lack of Transportation (Non-Medical): No  Physical Activity: Sufficiently Active   Days of Exercise per Week: 2 days   Minutes of Exercise per Session: 120 min  Stress: No Stress Concern Present   Feeling of Stress : Not at all  Social Connections: Not on file     Family History: The patient's family history includes Alcohol abuse in his paternal grandfather; Diabetes in his father; Heart disease in his father; Hypertension in his father; Lymphoma in his mother.  ROS:   Please see the history of present illness.     All other systems reviewed and are  negative.  EKGs/Labs/Other Studies Reviewed:    The following studies were reviewed today:   LHC/PCI (07/14/16): LMCA normal. Ostial LAD with 25% stenosis. Mid LAD with 95% lesion. LCx with minimal diffuse disease. RCA normal. Successful PCI to the mid LAD with placement of a Promus Premier 3.0 x 24 mm drug-eluting stent.    Exercise tolerance test (08/02/2017): Low risk study without ischemia.   TTE (07/13/2016): Normal LV size and function. LVEF 55-60%. Mild AI. Trivial MR. Lipomatous hypertrophy of the intra-atrial septum. Trivial TR. Trivial PR. Normal RV size and function.  EKG:  EKG is  ordered today.  The ekg  ordered today demonstrates SB, 52bpm, no ST/T wave changes  Recent Labs: 09/03/2020: ALT 14; BUN 16; Creatinine, Ser 1.13; Hemoglobin 14.7; Platelets 99.0; Potassium 4.0; Sodium 139  Recent Lipid Panel    Component Value Date/Time   CHOL 73 09/03/2020 0912   CHOL 67 (L) 08/25/2016 0920   TRIG 79.0 09/03/2020 0912   HDL 26.20 (L) 09/03/2020 0912   HDL 26 (L) 08/25/2016 0920   CHOLHDL 3 09/03/2020 0912   VLDL 15.8 09/03/2020 0912   LDLCALC 31 09/03/2020 0912   LDLCALC 22 08/25/2016 0920    Physical Exam:    VS:  BP 128/82 (BP Location: Left Arm, Patient Position: Sitting, Cuff Size: Normal)   Pulse (!) 52   Ht 6' 0.5" (1.842 m)   Wt 216 lb (98 kg)   SpO2 99%   BMI 28.89 kg/m     Wt Readings from Last 3 Encounters:  06/02/21 216 lb (98 kg)  09/11/20 214 lb 4 oz (97.2 kg)  06/02/20 212 lb (96.2 kg)     GEN:  Well nourished, well developed in no acute distress HEENT: Normal NECK: No JVD; No carotid bruits LYMPHATICS: No lymphadenopathy CARDIAC: RRR, no murmurs, rubs, gallops RESPIRATORY:  Clear to auscultation without rales, wheezing or rhonchi  ABDOMEN: Soft, non-tender, non-distended MUSCULOSKELETAL:  No edema; No deformity  SKIN: Warm and dry NEUROLOGIC:  Alert and oriented x 3 PSYCHIATRIC:  Normal affect   ASSESSMENT:    1. Coronary artery disease  of native artery of native heart with stable angina pectoris ([place])   2. Hyperlipidemia LDL goal <70   3. Essential hypertension   4. Venous insufficiency   5. Sinus bradycardia    PLAN:    In order of problems listed above:  CAD s/p PCI to mid LAD 06/216 Patient denies anginal symptoms. EKG today stable with SB. He is active and tries to eat a good diet. No indication for ischemic evaluation at this time.Continue Aspirin and statin. No BB with bradycardia.    Hyperlipidemia LDL 31 08/2020. Continue statin  Venous insufficiency Has varicose veins with prior vein striping. Has swelling at the end of the day. HE wears compression socks as needed and follows low salt diet. Will likely see vascular specialist in the near future.   Sinus bradycardia EKG shows SB 52 bpm. Not on rate lowering medication. Follow with EKG.   Disposition: Follow up in 1 year(s) with MD/APP    Signed, Chastelyn Athens Ninfa Meeker, PA-C  06/02/2021 1:43 PM    [facility]

## 2021-06-02 ENCOUNTER — Other Ambulatory Visit: Payer: Self-pay

## 2021-06-02 ENCOUNTER — Ambulatory Visit: Payer: Medicare HMO | Admitting: Medical

## 2021-06-02 ENCOUNTER — Encounter: Payer: Self-pay | Admitting: Medical

## 2021-06-02 VITALS — BP 128/82 | HR 52 | Ht 72.5 in | Wt 216.0 lb

## 2021-06-02 DIAGNOSIS — I10 Essential (primary) hypertension: Secondary | ICD-10-CM | POA: Diagnosis not present

## 2021-06-02 DIAGNOSIS — E785 Hyperlipidemia, unspecified: Secondary | ICD-10-CM | POA: Diagnosis not present

## 2021-06-02 DIAGNOSIS — I872 Venous insufficiency (chronic) (peripheral): Secondary | ICD-10-CM

## 2021-06-02 DIAGNOSIS — D2262 Melanocytic nevi of left upper limb, including shoulder: Secondary | ICD-10-CM | POA: Diagnosis not present

## 2021-06-02 DIAGNOSIS — D0439 Carcinoma in situ of skin of other parts of face: Secondary | ICD-10-CM | POA: Diagnosis not present

## 2021-06-02 DIAGNOSIS — R001 Bradycardia, unspecified: Secondary | ICD-10-CM | POA: Diagnosis not present

## 2021-06-02 DIAGNOSIS — D225 Melanocytic nevi of trunk: Secondary | ICD-10-CM | POA: Diagnosis not present

## 2021-06-02 DIAGNOSIS — D2261 Melanocytic nevi of right upper limb, including shoulder: Secondary | ICD-10-CM | POA: Diagnosis not present

## 2021-06-02 DIAGNOSIS — I25118 Atherosclerotic heart disease of native coronary artery with other forms of angina pectoris: Secondary | ICD-10-CM | POA: Diagnosis not present

## 2021-06-02 DIAGNOSIS — D2272 Melanocytic nevi of left lower limb, including hip: Secondary | ICD-10-CM | POA: Diagnosis not present

## 2021-06-02 DIAGNOSIS — D485 Neoplasm of uncertain behavior of skin: Secondary | ICD-10-CM | POA: Diagnosis not present

## 2021-06-02 DIAGNOSIS — D2271 Melanocytic nevi of right lower limb, including hip: Secondary | ICD-10-CM | POA: Diagnosis not present

## 2021-06-02 NOTE — Patient Instructions (Signed)
Medication Instructions:  No changes at this time.  *If you need a refill on your cardiac medications before your next appointment, please call your pharmacy*   Lab Work: None  If you have labs (blood work) drawn today and your tests are completely normal, you will receive your results only by: MyChart Message (if you have MyChart) OR A paper copy in the mail If you have any lab test that is abnormal or we need to change your treatment, we will call you to review the results.   Testing/Procedures: None   Follow-Up: At [facility], you and your health needs are our priority.  As part of our continuing mission to provide you with exceptional heart care, we have created designated Provider Care Teams.  These Care Teams include your primary Cardiologist (physician) and Advanced Practice Providers (APPs -  Physician Assistants and Nurse Practitioners) who all work together to provide you with the care you need, when you need it.  Your next appointment:   1 year(s)  The format for your next appointment:   In Person  Provider:   You may see Christopher End, MD or one of the following Advanced Practice Providers on your designated Care Team:   Christopher Berge, NP Ryan Dunn, PA-C Jacquelyn Visser, PA-C Cadence Furth, PA-C  

## 2021-07-02 ENCOUNTER — Telehealth: Payer: Self-pay | Admitting: Family Medicine

## 2021-07-02 NOTE — Telephone Encounter (Signed)
I need a reason for the referral.  What is he being seen for?

## 2021-07-02 NOTE — Telephone Encounter (Signed)
Pt wife called stating that pt needs a referral to [place] [facility] with Dr Vista Lawman. Pt wife states that he has a appt Wednesday (07/08/21) at 1:30.

## 2021-07-03 ENCOUNTER — Ambulatory Visit (INDEPENDENT_AMBULATORY_CARE_PROVIDER_SITE_OTHER): Payer: Medicare HMO | Admitting: Nurse Practitioner

## 2021-07-03 ENCOUNTER — Other Ambulatory Visit: Payer: Self-pay

## 2021-07-03 ENCOUNTER — Ambulatory Visit (INDEPENDENT_AMBULATORY_CARE_PROVIDER_SITE_OTHER)
Admission: RE | Admit: 2021-07-03 | Discharge: 2021-07-03 | Disposition: A | Discharge status: Home / Self Care | Payer: Medicare HMO | Source: Ambulatory Visit | Attending: Nurse Practitioner | Admitting: Nurse Practitioner

## 2021-07-03 ENCOUNTER — Ambulatory Visit: Payer: Medicare HMO | Admitting: Family

## 2021-07-03 VITALS — BP 110/62 | HR 67 | Temp 97.8°F | Resp 14 | Ht 72.5 in | Wt 216.0 lb

## 2021-07-03 DIAGNOSIS — S6992XA Unspecified injury of left wrist, hand and finger(s), initial encounter: Secondary | ICD-10-CM | POA: Diagnosis not present

## 2021-07-03 DIAGNOSIS — M79645 Pain in left finger(s): Secondary | ICD-10-CM | POA: Diagnosis not present

## 2021-07-03 DIAGNOSIS — S62524A Nondisplaced fracture of distal phalanx of right thumb, initial encounter for closed fracture: Secondary | ICD-10-CM | POA: Diagnosis not present

## 2021-07-03 DIAGNOSIS — S60012A Contusion of left thumb without damage to nail, initial encounter: Secondary | ICD-10-CM | POA: Diagnosis not present

## 2021-07-03 DIAGNOSIS — M189 Osteoarthritis of first carpometacarpal joint, unspecified: Secondary | ICD-10-CM | POA: Diagnosis not present

## 2021-07-03 DIAGNOSIS — M069 Rheumatoid arthritis, unspecified: Secondary | ICD-10-CM | POA: Diagnosis not present
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
## 2021-07-03 NOTE — Assessment & Plan Note (Signed)
Been doing over-the-counter regimens with good relief along with rest and ice he may continue.
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
## 2021-07-03 NOTE — Telephone Encounter (Signed)
Reason for referral:  Varicose Veins.

## 2021-07-03 NOTE — Assessment & Plan Note (Signed)
I did review the x-ray while patient was in office.  Did look like a bone fragment at the distal phalanx of right thumb did send out for radiology overread and confirmed.  Patient does have pinpoint tenderness on the dorsal side of the thumb did place him in a metal splint with Coban wrapping gave him signs and symptoms as when to seek emergent or urgent health care inclusive of bruising feeling swelling increasing or pain that increases and cannot be managed.  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  Did call and discussed this with his spouse as he was not available we will get him scheduled up with her sports med doc for further evaluation do believe this can be managed nonsurgically.

## 2021-07-03 NOTE — Patient Instructions (Addendum)
Nice to see you today.  Continue using conservative treatments Will splint for support. I will call with official read of the xray

## 2021-07-05 ENCOUNTER — Other Ambulatory Visit: Payer: Self-pay | Admitting: Family Medicine

## 2021-07-05 DIAGNOSIS — I8390 Asymptomatic varicose veins of unspecified lower extremity: Secondary | ICD-10-CM

## 2021-07-05 NOTE — Telephone Encounter (Signed)
done

## 2021-07-08 ENCOUNTER — Other Ambulatory Visit: Payer: Self-pay

## 2021-07-08 ENCOUNTER — Encounter: Payer: Self-pay | Admitting: Family Medicine

## 2021-07-08 ENCOUNTER — Ambulatory Visit (INDEPENDENT_AMBULATORY_CARE_PROVIDER_SITE_OTHER): Payer: Medicare HMO | Admitting: Family Medicine

## 2021-07-08 VITALS — BP 110/70 | HR 56 | Temp 98.8°F | Ht 72.5 in | Wt 213.0 lb

## 2021-07-08 DIAGNOSIS — S62525D Nondisplaced fracture of distal phalanx of left thumb, subsequent encounter for fracture with routine healing: Secondary | ICD-10-CM

## 2021-07-08 DIAGNOSIS — I87393 Chronic venous hypertension (idiopathic) with other complications of bilateral lower extremity: Secondary | ICD-10-CM | POA: Diagnosis not present

## 2021-07-08 DIAGNOSIS — I872 Venous insufficiency (chronic) (peripheral): Secondary | ICD-10-CM | POA: Diagnosis not present

## 2021-07-08 DIAGNOSIS — M79645 Pain in left finger(s): Secondary | ICD-10-CM | POA: Diagnosis not present

## 2021-07-08 NOTE — Progress Notes (Signed)
Mckynzi Cammon T. Michel Eskelson, MD, [place] at [facility] [place], [address]  Phone: [phone]  FAX: [place] - 70 y.o. male  MRN [id number]  Date of Birth: 07-Jun-1951  Date: 07/08/2021  PCP: Owens Loffler, MD  Referral: Owens Loffler, MD  Chief Complaint  Patient presents with   Follow-up    Left Thumb Injury    This visit occurred during the SARS-CoV-2 public health emergency.  Safety protocols were in place, including screening questions prior to the visit, additional usage of staff PPE, and extensive cleaning of exam room while observing appropriate contact time as indicated for disinfecting solutions.   Subjective:   Phillip [name] is a 70 y.o. very pleasant male patient with Body mass index is 28.49 kg/m. who presents with the following:  DOI: 07/02/2021  F/u L IP joint fracture.  Initially hyperextended the L thumb while the steering wheel of a tractor caught his thumb.  Distal phalanx fracture, into the IP joint on the L thumb. Was moving some dirt and and steering wheel. He had a hyperextension injury with pain all about the IP joint on the left hand.  He had quite a bit of initial swelling as well as bruising.  His pain has been improving.  He saw Mr. Cable, and he placed him in a aluminum form splint which has been held in place with a Coban wrap typical of a thumb spica isolation wrap.  His pain and his bruising is improving.  The patient and I reviewed his images face-to-face in the office.  The osteophyte is very well seen as described by radiology.  It has the appearance of a large fragment.  Patient does have a history of remote injury, and this could be reflected here.  Clinically, the patient is quite tender in this area.  On the oblique, I do think it has more of an appearance of a cortical disruption.  DG Finger Thumb Left  Result Date: 07/03/2021 CLINICAL DATA:   Injury and bruising of the left thumb EXAM: LEFT THUMB 2+V COMPARISON:  None. FINDINGS: Age-indeterminate osteophyte fragmentation dorsally along the base of the distal phalanx of the thumb best appreciated on the lateral projection. Degenerative spurring and mild loss of articular space at the first MCP joint and at the first carpometacarpal articular space. Abnormal loss of articular space at the second and third MCP joints. There is a 1 mm well corticated calcification just medial to the base of the proximal phalanx of the thumb. Given the well corticated appearance I doubt that this represents an acute ulnar collateral ligament avulsion. IMPRESSION: 1. Age-indeterminate fragmented osteophyte from the base of the distal phalanx of the thumb dorsally. Correlate with point tenderness. 2. Degenerative findings at the first MTP joint and at the first carpometacarpal articulation. 3. Articular space narrowing at the second and third MCP joints, which can sometimes be associated with rheumatoid arthritis. Electronically Signed   By: Van Clines M.D.   On: 07/03/2021 14:50     Review of Systems is noted in the HPI, as appropriate   Objective:   BP 110/70   Pulse (!) 56   Temp 98.8 F (37.1 C) (Temporal)   Ht 6' 0.5" (1.842 m)   Wt 213 lb (96.6 kg)   SpO2 100%   BMI 28.49 kg/m   The entirety of the left wrist, forearm, elbow are nontender.  All metacarpals are nontender.  Digits 2  through 5 are nontender.  The ulnar collateral ligament and radial collateral ligament is intact at the MCP on the first. Patient is tender relatively diffusely about the IP joint and more proximally. The flexor and extensor tendons are intact at the first digit on the left. Does have some pain with radial and ulnar stress at the IP joint and he has pain with palpation.  Radiology: DG Finger Thumb Left  Result Date: 07/03/2021 CLINICAL DATA:  Injury and bruising of the left thumb EXAM: LEFT THUMB 2+V  COMPARISON:  None. FINDINGS: Age-indeterminate osteophyte fragmentation dorsally along the base of the distal phalanx of the thumb best appreciated on the lateral projection. Degenerative spurring and mild loss of articular space at the first MCP joint and at the first carpometacarpal articular space. Abnormal loss of articular space at the second and third MCP joints. There is a 1 mm well corticated calcification just medial to the base of the proximal phalanx of the thumb. Given the well corticated appearance I doubt that this represents an acute ulnar collateral ligament avulsion. IMPRESSION: 1. Age-indeterminate fragmented osteophyte from the base of the distal phalanx of the thumb dorsally. Correlate with point tenderness. 2. Degenerative findings at the first MTP joint and at the first carpometacarpal articulation. 3. Articular space narrowing at the second and third MCP joints, which can sometimes be associated with rheumatoid arthritis. Electronically Signed   By: Van Clines M.D.   On: 07/03/2021 14:50    Assessment and Plan:     ICD-10-CM   1. Closed nondisplaced fracture of distal phalanx of left thumb with routine healing, subsequent encounter  S62.525D     2. Thumb pain, left  M79.645      Clinically consistent with fracture with pain at the site of cortical disruption.  Given the appearance, my suspicion is that he had some bony change in this region associated with his prior injury.  It is still tender to palpate.  Certainly ligamentous disruption at the IP joint is contributing to pain.  I do not appreciate any pain significantly at the volar plate.  Continue with IP joint immobilization x3 additional weeks with recheck at that point.  Social: Pain is limiting his activities of daily living using the thumb, working, as well as physical activity.   Follow-up: Return in about 3 weeks (around 07/29/2021).  Dr[employer] One speech-to-text software was used for transcription in  this dictation.  Possible transcriptional errors can occur using Editor, commissioning.   Signed,  Maud Deed. Lamonte Hartt, MD   Outpatient Encounter Medications as of 07/08/2021  Medication Sig   Ascorbic Acid (VITAMIN C PO) Take 1 tablet by mouth daily.   aspirin EC 81 MG tablet Take 1 tablet (81 mg total) by mouth daily.   atorvastatin (LIPITOR) 10 MG tablet Take 1 tablet (10 mg total) by mouth daily.   Cholecalciferol (VITAMIN D3) 1.25 MG (50000 UT) CAPS Take by mouth.   Coenzyme Q10 (CO Q-10 PO) Take 1 tablet by mouth daily.   folic acid (FOLVITE) 188 MCG tablet Take 400 mcg by mouth daily.   Magnesium 250 MG TABS Take by mouth.   Multiple Vitamins-Minerals (ZINC PO) Take 1 tablet by mouth daily.   nitroGLYCERIN (NITROSTAT) 0.4 MG SL tablet PLACE 1 TABLET UNDER THE TONGUE EVERY 5 MINUTES AS NEEDED FOR CHEST PAIN. MAX 3 DOSES   omeprazole (PRILOSEC OTC) 20 MG tablet Take 20 mg by mouth daily.   saw palmetto 80 MG capsule Take 80 mg by  mouth daily.   vitamin B-12 500 MCG tablet Take 1 tablet (500 mcg total) by mouth daily.   No facility-administered encounter medications on file as of 07/08/2021.

## 2021-07-22 DIAGNOSIS — D0439 Carcinoma in situ of skin of other parts of face: Secondary | ICD-10-CM | POA: Diagnosis not present

## 2021-07-23 DIAGNOSIS — I83813 Varicose veins of bilateral lower extremities with pain: Secondary | ICD-10-CM | POA: Diagnosis not present

## 2021-07-23 DIAGNOSIS — M7989 Other specified soft tissue disorders: Secondary | ICD-10-CM | POA: Diagnosis not present

## 2021-07-29 ENCOUNTER — Encounter: Payer: Self-pay | Admitting: Family Medicine

## 2021-07-29 ENCOUNTER — Other Ambulatory Visit: Payer: Self-pay

## 2021-07-29 ENCOUNTER — Ambulatory Visit (INDEPENDENT_AMBULATORY_CARE_PROVIDER_SITE_OTHER): Payer: Medicare HMO | Admitting: Family Medicine

## 2021-07-29 VITALS — BP 130/60 | HR 62 | Temp 97.6°F | Ht 72.5 in | Wt 224.2 lb

## 2021-07-29 DIAGNOSIS — S62525D Nondisplaced fracture of distal phalanx of left thumb, subsequent encounter for fracture with routine healing: Secondary | ICD-10-CM

## 2021-07-29 NOTE — Progress Notes (Signed)
Dianne Whelchel T. Sarh Kirschenbaum, MD, [place] at [facility] [place], [address]  Phone: [phone]   FAX: [place] - 70 y.o. male   MRN [id number]   Date of Birth: [birth year]  Date: 07/29/2021   PCP: Owens Loffler, MD   Referral: Owens Loffler, MD  Chief Complaint  Patient presents with   Follow-up    Left Thumb Fx    This visit occurred during the SARS-CoV-2 public health emergency.  Safety protocols were in place, including screening questions prior to the visit, additional usage of staff PPE, and extensive cleaning of exam room while observing appropriate contact time as indicated for disinfecting solutions.   Subjective:   Phillip [name] is a 70 y.o. very pleasant male patient with Body mass index is 30 kg/m. who presents with the following:  He is here about follow-up for his left-sided thumb pain with a nondisplaced fracture at the distal phalanx on the left thumb.  He has been immobilized now for 4 weeks. Aluminum splint.  He has been very compliant.  He he is Just wrapped up including the thenar and hyperthenar eminences and wrist.  He does have decreased pain, and his motion is preserved.  Getting better, 3 more weeks then d/c ip splint  Review of Systems is noted in the HPI, as appropriate   Objective:   BP 130/60    Pulse 62    Temp 97.6 F (36.4 C) (Temporal)    Ht 6' 0.5" (1.842 m)    Wt 224 lb 4 oz (101.7 kg)    SpO2 98%    BMI 30.00 kg/m   Full range of motion at all digits 2 through 5.  Nontender in the wrist and all metacarpals.  Nontender at the first metacarpal.  He does still have some tenderness at the IP joint on the dorsum, but this is notably better compared to initial evaluation. Neurovascularly intact and he does also have ability to extend and flex at the IP joint.  Radiology: DG Finger Thumb Left  Result Date: 07/03/2021 CLINICAL DATA:  Injury and bruising of the  left thumb EXAM: LEFT THUMB 2+V COMPARISON:  None. FINDINGS: Age-indeterminate osteophyte fragmentation dorsally along the base of the distal phalanx of the thumb best appreciated on the lateral projection. Degenerative spurring and mild loss of articular space at the first MCP joint and at the first carpometacarpal articular space. Abnormal loss of articular space at the second and third MCP joints. There is a 1 mm well corticated calcification just medial to the base of the proximal phalanx of the thumb. Given the well corticated appearance I doubt that this represents an acute ulnar collateral ligament avulsion. IMPRESSION: 1. Age-indeterminate fragmented osteophyte from the base of the distal phalanx of the thumb dorsally. Correlate with point tenderness. 2. Degenerative findings at the first MTP joint and at the first carpometacarpal articulation. 3. Articular space narrowing at the second and third MCP joints, which can sometimes be associated with rheumatoid arthritis. Electronically Signed   By: Van Clines M.D.   On: 07/03/2021 14:50    Assessment and Plan:     ICD-10-CM   1. Closed nondisplaced fracture of distal phalanx of left thumb with routine healing, subsequent encounter  S62.525D      Improving, continue bracing for 3 weeks then DC.  After this some work on flexion and extension at the IP joint.  Follow-up: As needed only  [employer] One speech-to-text software was used for transcription in this dictation.  Possible transcriptional errors can occur using Editor, commissioning.   Signed,  Maud Deed. Cheyann Blecha, MD   Outpatient Encounter Medications as of 07/29/2021  Medication Sig   Ascorbic Acid (VITAMIN C PO) Take 1 tablet by mouth daily.   aspirin EC 81 MG tablet Take 1 tablet (81 mg total) by mouth daily.   atorvastatin (LIPITOR) 10 MG tablet Take 1 tablet (10 mg total) by mouth daily.   Cholecalciferol (VITAMIN D3) 1.25 MG (50000 UT) CAPS Take by mouth.   Coenzyme Q10  (CO Q-10 PO) Take 1 tablet by mouth daily.   folic acid (FOLVITE) 295 MCG tablet Take 400 mcg by mouth daily.   Magnesium 250 MG TABS Take by mouth.   Multiple Vitamins-Minerals (ZINC PO) Take 1 tablet by mouth daily.   nitroGLYCERIN (NITROSTAT) 0.4 MG SL tablet PLACE 1 TABLET UNDER THE TONGUE EVERY 5 MINUTES AS NEEDED FOR CHEST PAIN. MAX 3 DOSES   omeprazole (PRILOSEC OTC) 20 MG tablet Take 20 mg by mouth daily.   saw palmetto 80 MG capsule Take 80 mg by mouth daily.   vitamin B-12 500 MCG tablet Take 1 tablet (500 mcg total) by mouth daily.   No facility-administered encounter medications on file as of 07/29/2021.

## 2021-07-31 DIAGNOSIS — R600 Localized edema: Secondary | ICD-10-CM | POA: Diagnosis not present

## 2021-07-31 DIAGNOSIS — I83893 Varicose veins of bilateral lower extremities with other complications: Secondary | ICD-10-CM | POA: Diagnosis not present

## 2021-08-19 DIAGNOSIS — I83892 Varicose veins of left lower extremities with other complications: Secondary | ICD-10-CM | POA: Diagnosis not present

## 2021-08-25 DIAGNOSIS — I87393 Chronic venous hypertension (idiopathic) with other complications of bilateral lower extremity: Secondary | ICD-10-CM | POA: Diagnosis not present

## 2021-08-25 DIAGNOSIS — Z09 Encounter for follow-up examination after completed treatment for conditions other than malignant neoplasm: Secondary | ICD-10-CM | POA: Diagnosis not present

## 2021-08-25 DIAGNOSIS — I83891 Varicose veins of right lower extremities with other complications: Secondary | ICD-10-CM | POA: Diagnosis not present

## 2021-08-28 DIAGNOSIS — I83892 Varicose veins of left lower extremities with other complications: Secondary | ICD-10-CM | POA: Diagnosis not present

## 2021-09-04 NOTE — Progress Notes (Signed)
Subjective:   Phillip [name] is a 71 y.o. male who presents for Medicare Annual/Subsequent preventive examination.  I connected with Phillip [name] today by telephone and verified that I am speaking with the correct person using two identifiers. Location patient: home Location provider: work Persons participating in the virtual visit: patient, Marine scientist.    I discussed the limitations, risks, security and privacy concerns of performing an evaluation and management service by telephone and the availability of in person appointments. I also discussed with the patient that there may be a patient responsible charge related to this service. The patient expressed understanding and verbally consented to this telephonic visit.    Interactive audio and video telecommunications were attempted between this provider and patient, however failed, due to patient having technical difficulties OR patient did not have access to video capability.  We continued and completed visit with audio only.  Some vital signs may be absent or patient reported.   Time Spent with patient on telephone encounter: 20 minutes  Review of Systems     Cardiac Risk Factors include: advanced age (>33men, >93 women);dyslipidemia     Objective:    Today's Vitals   09/07/21 1357  Weight: 220 lb (99.8 kg)  Height: 6' (1.829 m)   Body mass index is 29.84 kg/m.  Advanced Directives 09/07/2021 05/02/2019 11/18/2017 08/02/2016 07/13/2016 07/12/2016  Does Patient Have a Medical Advance Directive? No No No No No No  Would patient like information on creating a medical advance directive? Yes (MAU/Ambulatory/Procedural Areas - Information given) No - Patient declined Yes (MAU/Ambulatory/Procedural Areas - Information given) Yes (MAU/Ambulatory/Procedural Areas - Information given) No - Patient declined No - Patient declined    Current Medications (verified) Outpatient Encounter Medications as of 09/07/2021  Medication Sig    Ascorbic Acid (VITAMIN C PO) Take 1 tablet by mouth daily.   aspirin EC 81 MG tablet Take 1 tablet (81 mg total) by mouth daily.   atorvastatin (LIPITOR) 10 MG tablet Take 1 tablet (10 mg total) by mouth daily.   Cholecalciferol (VITAMIN D3) 1.25 MG (50000 UT) CAPS Take by mouth.   Coenzyme Q10 (CO Q-10 PO) Take 1 tablet by mouth daily.   folic acid (FOLVITE) 696 MCG tablet Take 400 mcg by mouth daily.   Magnesium 250 MG TABS Take by mouth.   Multiple Vitamins-Minerals (ZINC PO) Take 1 tablet by mouth daily.   nitroGLYCERIN (NITROSTAT) 0.4 MG SL tablet PLACE 1 TABLET UNDER THE TONGUE EVERY 5 MINUTES AS NEEDED FOR CHEST PAIN. MAX 3 DOSES   omeprazole (PRILOSEC OTC) 20 MG tablet Take 20 mg by mouth daily.   saw palmetto 80 MG capsule Take 80 mg by mouth daily.   vitamin B-12 500 MCG tablet Take 1 tablet (500 mcg total) by mouth daily.   No facility-administered encounter medications on file as of 09/07/2021.    Allergies (verified) Penicillins   History: Past Medical History:  Diagnosis Date   Allergic rhinitis    Arthritis    CAD (coronary artery disease)    a. 06/2016 Echo: EF 55-60%, no rwma, mild AI, triv MR/TR/PR, lipomatous hypertrophy of atrial septum;  b. 06/2016 Ex MV: EF 56%, large, partially reversible anteroseptal, inferoseptal, inferior,and apical perfusion defect - borderline for ischemia (TID 1.64) - equivocal study;  c. 06/2016 PCI: LM nl, LAD 25p/42m (3.0x24 Promus Premier MR DES), LCX min irregs, RCA nl.   Cataract      left eye    Colon polyps  a. Severe dysplasia in a right-sided polyp removed surgically in [place], 2006--> final pathology showed this was not a cancer.  Follow up colonoscopy at one year interval was normal.   Erectile dysfunction    GERD (gastroesophageal reflux disease)    H/o Barrett's esophagus, orginally diagnosed in 2006, biopsies showed no dysplasia; follow up EGD also showed no dysplasia   History of BPH    Lyme disease    x2   Past  Surgical History:  Procedure Laterality Date   CARDIAC CATHETERIZATION N/A 07/14/2016   Procedure: Left Heart Cath and Coronary Angiography;  Surgeon: Sherren Mocha, MD;  Location: [facility];  Service: Cardiovascular;  Laterality: N/A;   CARDIAC CATHETERIZATION N/A 07/14/2016   Procedure: Coronary Stent Intervention;  Surgeon: Sherren Mocha, MD;  Location: [facility];  Service: Cardiovascular;  Laterality: N/A;   COLONOSCOPY  12/2008   Repeat 5 years   ESOPHAGOGASTRODUODENOSCOPY  12/2008   Barrett's, repeat 3 years   HEMICOLECTOMY  2006   Right, for severely dysplastic right colon polyp   HERNIA REPAIR  0354   UMBILICAL   LUMBAR [place]   L4-5   TONSILLECTOMY     As a child   TUMOR REMOVAL  2002   Fatty tumor (neck)   UPPER GASTROINTESTINAL ENDOSCOPY     VARICOSE VEIN SURGERY  1997   Removal, right   Family History  Problem Relation Age of Onset   Hypertension Father    Diabetes Father    Heart disease Father    Lymphoma Mother    Alcohol abuse Paternal Grandfather    Social History   Socioeconomic History   Marital status: Married    Spouse name: Not on file   Number of children: 3   Years of education: Not on file   Highest education level: Not on file  Occupational History   Occupation: Theme park manager, Personnel officer    Employer: [employer]  Tobacco Use   Smoking status: Never   Smokeless tobacco: Never  Vaping Use   Vaping Use: Never used  Substance and Sexual Activity   Alcohol use: No   Drug use: No   Sexual activity: Not on file  Other Topics Concern   Not on file  Social History Narrative   Lives in [place] with his wife.  Tax inspector.   Social Determinants of Health   Financial Resource Strain: Low Risk    Difficulty of Paying Living Expenses: Not hard at all  Food Insecurity: No Food Insecurity   Worried About Charity fundraiser in the Last Year: Never true   [place] in the Last Year:  Never true  Transportation Needs: No Transportation Needs   Lack of Transportation (Medical): No   Lack of Transportation (Non-Medical): No  Physical Activity: Insufficiently Active   Days of Exercise per Week: 1 day   Minutes of Exercise per Session: 60 min  Stress: No Stress Concern Present   Feeling of Stress : Not at all  Social Connections: Socially Integrated   Frequency of Communication with Friends and Family: More than three times a week   Frequency of Social Gatherings with Friends and Family: More than three times a week   Attends Religious Services: More than 4 times per year   Active Member of [employer] or Organizations: Yes   Attends Archivist Meetings: More than 4 times per year   Marital Status: Married  Tobacco Counseling Counseling given: Not Answered   Clinical Intake:  Pre-visit preparation completed: Yes  Pain : No/denies pain     BMI - recorded: 29.84 Nutritional Status: BMI 25 -29 Overweight Nutritional Risks: None Diabetes: No  How often do you need to have someone help you when you read instructions, pamphlets, or other written materials from your doctor or pharmacy?: 1 - Never  Diabetic? No  Interpreter Needed?: No  Information entered by :: Orrin Brigham LPN   Activities of Daily Living In your present state of health, do you have any difficulty performing the following activities: 09/07/2021  Hearing? N  Vision? N  Difficulty concentrating or making decisions? N  Walking or climbing stairs? Y  Comment knee pain  Dressing or bathing? N  Doing errands, shopping? N  Preparing Food and eating ? N  Using the Toilet? N  In the past six months, have you accidently leaked urine? N  Do you have problems with loss of bowel control? N  Managing your Medications? N  Managing your Finances? N  Housekeeping or managing your Housekeeping? N  Some recent data might be hidden    Patient Care Team: Owens Loffler, MD as PCP -  General End, Harrell Gave, MD as PCP - Cardiology (Cardiology)  Indicate any recent Medical Services you may have received from other than [facility] providers in the past year (date may be approximate).     Assessment:   This is a routine wellness examination for Phillip [name].  Hearing/Vision screen Hearing Screening - Comments:: Ringing in ears  Vision Screening - Comments:: Last exam 2022, has an upcoming appointment. Dr. Zadie Rhine @ [facility]  Dietary issues and exercise activities discussed: Current Exercise Habits: Home exercise routine, Type of exercise: strength training/weights;treadmill;Other - see comments (elliptical), Time (Minutes): 60, Frequency (Times/Week): 1, Weekly Exercise (Minutes/Week): 60, Intensity: Mild   Goals Addressed             This Visit's Progress    Patient Stated       Would like to increase exercise routine and drink more water       Depression Screen PHQ 2/9 Scores 09/07/2021 09/05/2020 05/02/2019 11/18/2017 08/02/2016  PHQ - 2 Score 0 0 0 0 0  PHQ- 9 Score - 0 0 0 1    Fall Risk Fall Risk  09/07/2021 09/05/2020 05/02/2019 11/18/2017 08/02/2016  Falls in the past year? 0 0 0 No No  Number falls in past yr: 0 0 0 - -  Injury with Fall? 0 0 - - -  Risk for fall due to : - No Fall Risks Medication side effect - -  Follow up Falls prevention discussed Falls evaluation completed;Falls prevention discussed Falls evaluation completed;Falls prevention discussed - -    FALL RISK PREVENTION PERTAINING TO THE HOME:  Any stairs in or around the home? Yes  If so, are there any without handrails? No  Home free of loose throw rugs in walkways, pet beds, electrical cords, etc? Yes  Adequate lighting in your home to reduce risk of falls? Yes   ASSISTIVE DEVICES UTILIZED TO PREVENT FALLS:  Life alert? No  Use of a cane, walker or w/c? No  Grab bars in the bathroom? No  Shower chair or bench in shower? Yes  Elevated toilet seat or a handicapped toilet? No   TIMED UP  AND GO:  Was the test performed? No .    Cognitive Function: Normal cognitive status assessed by  this Nurse Health Advisor.  No abnormalities found.   MMSE - Mini Mental State Exam 09/05/2020 05/02/2019 11/18/2017  Not completed: Refused - -  Orientation to time - 5 5  Orientation to Place - 5 5  Registration - 3 3  Attention/ Calculation - 5 0  Recall - 3 3  Language- name 2 objects - - 0  Language- repeat - 1 1  Language- follow 3 step command - - 3  Language- read & follow direction - - 0  Write a sentence - - 0  Copy design - - 0  Total score - - 20        Immunizations Immunization History  Administered Date(s) Administered   Fluad Quad(high Dose 65+) 05/09/2019   H1N1 09/26/2008   Influenza Whole 09/26/2008   Influenza, High Dose Seasonal PF 05/31/2017, 06/14/2018, 06/04/2021   Influenza,inj,Quad PF,6+ Mos 05/30/2014, 06/07/2015, 05/10/2016   Influenza-Unspecified 05/16/2020   Moderna Covid-19 Vaccine Bivalent Booster 6yrs & up 06/04/2021   PFIZER(Purple Top)SARS-COV-2 Vaccination 08/31/2019, 09/21/2019, 08/05/2020   Pneumococcal Conjugate-13 07/22/2016   Pneumococcal Polysaccharide-23 08/17/2007, 11/18/2017   Td 12/16/2008   Tdap 10/16/2020    TDAP status: Up to date  Flu Vaccine status: Up to date  Pneumococcal vaccine status: Up to date  Covid-19 vaccine status: Completed vaccines  Qualifies for Shingles Vaccine? Yes   Zostavax completed No   Shingrix Completed?: No.    Education has been provided regarding the importance of this vaccine. Patient has been advised to call insurance company to determine out of pocket expense if they have not yet received this vaccine. Advised may also receive vaccine at local pharmacy or Health Dept. Verbalized acceptance and understanding.  Screening Tests Health Maintenance  Topic Date Due   Zoster Vaccines- Shingrix (1 of 2) Never done   COLONOSCOPY (Pts 45-36yrs Insurance coverage will need to be confirmed)   05/25/2023   TETANUS/TDAP  10/17/2030   Pneumonia Vaccine 40+ Years old  Completed   INFLUENZA VACCINE  Completed   COVID-19 Vaccine  Completed   Hepatitis C Screening  Completed   HPV VACCINES  Aged Out    Health Maintenance  Health Maintenance Due  Topic Date Due   Zoster Vaccines- Shingrix (1 of 2) Never done    Colorectal cancer screening: Type of screening: Colonoscopy. Completed 05/24/18. Repeat every 5 years  Lung Cancer Screening: (Low Dose CT Chest recommended if Age 39-80 years, 30 pack-year currently smoking OR have quit w/in 15years.) does not qualify.     Additional Screening:  Hepatitis C Screening: does qualify; Completed 11/18/17  Vision Screening: Recommended annual ophthalmology exams for early detection of glaucoma and other disorders of the eye. Is the patient up to date with their annual eye exam?  Yes  Who is the provider or what is the name of the office in which the patient attends annual eye exams? Dr. Zadie Rhine   Dental Screening: Recommended annual dental exams for proper oral hygiene  Community Resource Referral / Chronic Care Management: CRR required this visit?  No   CCM required this visit?  No      Plan:     I have personally reviewed and noted the following in the patients chart:   Medical and social history Use of alcohol, tobacco or illicit drugs  Current medications and supplements including opioid prescriptions. Patient is not currently taking opioid prescriptions. Functional ability and status Nutritional status Physical activity Advanced directives List of other physicians Hospitalizations, surgeries, and ER visits in previous 12 months Vitals Screenings  to include cognitive, depression, and falls Referrals and appointments  In addition, I have reviewed and discussed with patient certain preventive protocols, quality metrics, and best practice recommendations. A written personalized care plan for preventive services as well  as general preventive health recommendations were provided to patient.   Due to this being a telephonic visit, the after visit summary with patients personalized plan was offered to patient via mail or my-chart. Patient would like to access on my-chart.   Loma Messing, LPN   [id number]   Nurse Health Advisor  Nurse Notes: none

## 2021-09-07 ENCOUNTER — Ambulatory Visit (INDEPENDENT_AMBULATORY_CARE_PROVIDER_SITE_OTHER): Payer: Medicare HMO

## 2021-09-07 VITALS — Ht 72.0 in | Wt 220.0 lb

## 2021-09-07 DIAGNOSIS — Z0000 Encounter for general adult medical examination without abnormal findings: Secondary | ICD-10-CM

## 2021-09-07 NOTE — Patient Instructions (Addendum)
Phillip [name] , Thank you for taking time to complete your Medicare Wellness Visit. I appreciate your ongoing commitment to your health goals. Please review the following plan we discussed and let me know if I can assist you in the future.   Screening recommendations/referrals: Colonoscopy: up to date, completed 05/24/18, due 05/25/23 Recommended yearly ophthalmology/optometry visit for glaucoma screening and checkup Recommended yearly dental visit for hygiene and checkup  Vaccinations: Influenza vaccine: up to date Pneumococcal vaccine: up to date Tdap vaccine: up to date, 10/16/20 due 10/17/30 Shingles vaccine: Discuss with pharmacy   Covid-19: up to date  Advanced directives: information available at your next appointment  Conditions/risks identified: see problem list   Next appointment: Follow up in one year for your annual wellness visit. 09/08/22 @ 2:00pm, this will be a telephone visit  Preventive Care 10 Years and Older, Male Preventive care refers to lifestyle choices and visits with your health care provider that can promote health and wellness. What does preventive care include? A yearly physical exam. This is also called an annual well check. Dental exams once or twice a year. Routine eye exams. Ask your health care provider how often you should have your eyes checked. Personal lifestyle choices, including: Daily care of your teeth and gums. Regular physical activity. Eating a healthy diet. Avoiding tobacco and drug use. Limiting alcohol use. Practicing safe sex. Taking low doses of aspirin every day. Taking vitamin and mineral supplements as recommended by your health care provider. What happens during an annual well check? The services and screenings done by your health care provider during your annual well check will depend on your age, overall health, lifestyle risk factors, and family history of disease. Counseling  Your health care provider may ask you questions about  your: Alcohol use. Tobacco use. Drug use. Emotional well-being. Home and relationship well-being. Sexual activity. Eating habits. History of falls. Memory and ability to understand (cognition). Work and work Statistician. Screening  You may have the following tests or measurements: Height, weight, and BMI. Blood pressure. Lipid and cholesterol levels. These may be checked every 5 years, or more frequently if you are over 64 years old. Skin check. Lung cancer screening. You may have this screening every year starting at age 26 if you have a 30-pack-year history of smoking and currently smoke or have quit within the past 15 years. Fecal occult blood test (FOBT) of the stool. You may have this test every year starting at age 58. Flexible sigmoidoscopy or colonoscopy. You may have a sigmoidoscopy every 5 years or a colonoscopy every 10 years starting at age 23. Prostate cancer screening. Recommendations will vary depending on your family history and other risks. Hepatitis C blood test. Hepatitis B blood test. Sexually transmitted disease (STD) testing. Diabetes screening. This is done by checking your blood sugar (glucose) after you have not eaten for a while (fasting). You may have this done every 1-3 years. Abdominal aortic aneurysm (AAA) screening. You may need this if you are a current or former smoker. Osteoporosis. You may be screened starting at age 8 if you are at high risk. Talk with your health care provider about your test results, treatment options, and if necessary, the need for more tests. Vaccines  Your health care provider may recommend certain vaccines, such as: Influenza vaccine. This is recommended every year. Tetanus, diphtheria, and acellular pertussis (Tdap, Td) vaccine. You may need a Td booster every 10 years. Zoster vaccine. You may need this after age 3. Pneumococcal  13-valent conjugate (PCV13) vaccine. One dose is recommended after age 55. Pneumococcal  polysaccharide (PPSV23) vaccine. One dose is recommended after age 4. Talk to your health care provider about which screenings and vaccines you need and how often you need them. This information is not intended to replace advice given to you by your health care provider. Make sure you discuss any questions you have with your health care provider. Document Released: 08/29/2015 Document Revised: 04/21/2016 Document Reviewed: 06/03/2015 Elsevier Interactive Patient Education  2017 [employer] Prevention in the Home Falls can cause injuries. They can happen to people of all ages. There are many things you can do to make your home safe and to help prevent falls. What can I do on the outside of my home? Regularly fix the edges of walkways and driveways and fix any cracks. Remove anything that might make you trip as you walk through a door, such as a raised step or threshold. Trim any bushes or trees on the path to your home. Use bright outdoor lighting. Clear any walking paths of anything that might make someone trip, such as rocks or tools. Regularly check to see if handrails are loose or broken. Make sure that both sides of any steps have handrails. Any raised decks and porches should have guardrails on the edges. Have any leaves, snow, or ice cleared regularly. Use sand or salt on walking paths during winter. Clean up any spills in your garage right away. This includes oil or grease spills. What can I do in the bathroom? Use night lights. Install grab bars by the toilet and in the tub and shower. Do not use towel bars as grab bars. Use non-skid mats or decals in the tub or shower. If you need to sit down in the shower, use a plastic, non-slip stool. Keep the floor dry. Clean up any water that spills on the floor as soon as it happens. Remove soap buildup in the tub or shower regularly. Attach bath mats securely with double-sided non-slip rug tape. Do not have throw rugs and other  things on the floor that can make you trip. What can I do in the bedroom? Use night lights. Make sure that you have a light by your bed that is easy to reach. Do not use any sheets or blankets that are too big for your bed. They should not hang down onto the floor. Have a firm chair that has side arms. You can use this for support while you get dressed. Do not have throw rugs and other things on the floor that can make you trip. What can I do in the kitchen? Clean up any spills right away. Avoid walking on wet floors. Keep items that you use a lot in easy-to-reach places. If you need to reach something above you, use a strong step stool that has a grab bar. Keep electrical cords out of the way. Do not use floor polish or wax that makes floors slippery. If you must use wax, use non-skid floor wax. Do not have throw rugs and other things on the floor that can make you trip. What can I do with my stairs? Do not leave any items on the stairs. Make sure that there are handrails on both sides of the stairs and use them. Fix handrails that are broken or loose. Make sure that handrails are as long as the stairways. Check any carpeting to make sure that it is firmly attached to the stairs. Fix any carpet  that is loose or worn. Avoid having throw rugs at the top or bottom of the stairs. If you do have throw rugs, attach them to the floor with carpet tape. Make sure that you have a light switch at the top of the stairs and the bottom of the stairs. If you do not have them, ask someone to add them for you. What else can I do to help prevent falls? Wear shoes that: Do not have high heels. Have rubber bottoms. Are comfortable and fit you well. Are closed at the toe. Do not wear sandals. If you use a stepladder: Make sure that it is fully opened. Do not climb a closed stepladder. Make sure that both sides of the stepladder are locked into place. Ask someone to hold it for you, if possible. Clearly  mark and make sure that you can see: Any grab bars or handrails. First and last steps. Where the edge of each step is. Use tools that help you move around (mobility aids) if they are needed. These include: Canes. Walkers. Scooters. Crutches. Turn on the lights when you go into a dark area. Replace any light bulbs as soon as they burn out. Set up your furniture so you have a clear path. Avoid moving your furniture around. If any of your floors are uneven, fix them. If there are any pets around you, be aware of where they are. Review your medicines with your doctor. Some medicines can make you feel dizzy. This can increase your chance of falling. Ask your doctor what other things that you can do to help prevent falls. This information is not intended to replace advice given to you by your health care provider. Make sure you discuss any questions you have with your health care provider. Document Released: 05/29/2009 Document Revised: 01/08/2016 Document Reviewed: 09/06/2014 Elsevier Interactive Patient Education  2017 [employer].

## 2021-09-09 DIAGNOSIS — I83891 Varicose veins of right lower extremities with other complications: Secondary | ICD-10-CM | POA: Diagnosis not present

## 2021-09-09 DIAGNOSIS — I83813 Varicose veins of bilateral lower extremities with pain: Secondary | ICD-10-CM | POA: Diagnosis not present

## 2021-09-09 DIAGNOSIS — Z09 Encounter for follow-up examination after completed treatment for conditions other than malignant neoplasm: Secondary | ICD-10-CM | POA: Diagnosis not present

## 2021-09-11 DIAGNOSIS — M7989 Other specified soft tissue disorders: Secondary | ICD-10-CM | POA: Diagnosis not present

## 2021-09-11 DIAGNOSIS — I83892 Varicose veins of left lower extremities with other complications: Secondary | ICD-10-CM | POA: Diagnosis not present

## 2021-09-22 DIAGNOSIS — M7989 Other specified soft tissue disorders: Secondary | ICD-10-CM | POA: Diagnosis not present

## 2021-09-22 DIAGNOSIS — I83891 Varicose veins of right lower extremities with other complications: Secondary | ICD-10-CM | POA: Diagnosis not present

## 2021-09-22 DIAGNOSIS — H43393 Other vitreous opacities, bilateral: Secondary | ICD-10-CM | POA: Diagnosis not present

## 2021-09-30 DIAGNOSIS — I87392 Chronic venous hypertension (idiopathic) with other complications of left lower extremity: Secondary | ICD-10-CM | POA: Diagnosis not present

## 2021-09-30 DIAGNOSIS — I83892 Varicose veins of left lower extremities with other complications: Secondary | ICD-10-CM | POA: Diagnosis not present

## 2021-10-07 DIAGNOSIS — M7989 Other specified soft tissue disorders: Secondary | ICD-10-CM | POA: Diagnosis not present

## 2021-10-07 DIAGNOSIS — I83891 Varicose veins of right lower extremities with other complications: Secondary | ICD-10-CM | POA: Diagnosis not present

## 2021-10-08 ENCOUNTER — Other Ambulatory Visit: Payer: Self-pay | Admitting: Family Medicine

## 2021-10-08 NOTE — Telephone Encounter (Signed)
Please schedule CPE with fasting labs prior labs prior.  Already had [place] with nurse.

## 2021-10-14 DIAGNOSIS — I83892 Varicose veins of left lower extremities with other complications: Secondary | ICD-10-CM | POA: Diagnosis not present

## 2021-10-14 DIAGNOSIS — M7989 Other specified soft tissue disorders: Secondary | ICD-10-CM | POA: Diagnosis not present

## 2021-10-21 DIAGNOSIS — I83891 Varicose veins of right lower extremities with other complications: Secondary | ICD-10-CM | POA: Diagnosis not present

## 2021-10-21 DIAGNOSIS — I87391 Chronic venous hypertension (idiopathic) with other complications of right lower extremity: Secondary | ICD-10-CM | POA: Diagnosis not present

## 2021-10-28 DIAGNOSIS — I872 Venous insufficiency (chronic) (peripheral): Secondary | ICD-10-CM | POA: Diagnosis not present

## 2021-10-28 DIAGNOSIS — I8312 Varicose veins of left lower extremity with inflammation: Secondary | ICD-10-CM | POA: Diagnosis not present

## 2021-11-20 NOTE — Telephone Encounter (Signed)
Left message to call office to set up [facility] pt 2 and labs. ?

## 2021-12-04 DIAGNOSIS — Z85828 Personal history of other malignant neoplasm of skin: Secondary | ICD-10-CM | POA: Diagnosis not present

## 2021-12-04 DIAGNOSIS — D2262 Melanocytic nevi of left upper limb, including shoulder: Secondary | ICD-10-CM | POA: Diagnosis not present

## 2021-12-04 DIAGNOSIS — D485 Neoplasm of uncertain behavior of skin: Secondary | ICD-10-CM | POA: Diagnosis not present

## 2021-12-04 DIAGNOSIS — D225 Melanocytic nevi of trunk: Secondary | ICD-10-CM | POA: Diagnosis not present

## 2021-12-04 DIAGNOSIS — Z08 Encounter for follow-up examination after completed treatment for malignant neoplasm: Secondary | ICD-10-CM | POA: Diagnosis not present

## 2021-12-04 DIAGNOSIS — L821 Other seborrheic keratosis: Secondary | ICD-10-CM | POA: Diagnosis not present

## 2022-01-08 ENCOUNTER — Other Ambulatory Visit: Payer: Self-pay | Admitting: Family Medicine

## 2022-01-08 NOTE — Telephone Encounter (Signed)
Please schedule CPE with fasting labs prior with Dr. Lorelei Pont.  Already has Medicare Wellness.

## 2022-01-13 ENCOUNTER — Other Ambulatory Visit: Payer: Self-pay | Admitting: Family Medicine

## 2022-01-13 ENCOUNTER — Telehealth: Payer: Self-pay

## 2022-01-13 DIAGNOSIS — E785 Hyperlipidemia, unspecified: Secondary | ICD-10-CM

## 2022-01-13 DIAGNOSIS — R7309 Other abnormal glucose: Secondary | ICD-10-CM

## 2022-01-13 DIAGNOSIS — Z79899 Other long term (current) drug therapy: Secondary | ICD-10-CM

## 2022-01-13 DIAGNOSIS — Z125 Encounter for screening for malignant neoplasm of prostate: Secondary | ICD-10-CM

## 2022-01-13 NOTE — Telephone Encounter (Signed)
Can you help for [place] draw?  FLP, E78.5  hyperlipidemia  Cbc with diff, HFP, BMET: Z79.899 long-term medication usage Hemoglobin a1c: hyperglycemia Medicare psa: Z12.5 screening for prostate cancer

## 2022-01-13 NOTE — Telephone Encounter (Signed)
Patient is scheduled   

## 2022-01-13 NOTE — Telephone Encounter (Signed)
Orders placed.

## 2022-01-13 NOTE — Telephone Encounter (Signed)
Patient is scheduled for an annual on 6/19 and would like to go to [facility] lab for lab work as he is in [place].

## 2022-01-25 ENCOUNTER — Other Ambulatory Visit (INDEPENDENT_AMBULATORY_CARE_PROVIDER_SITE_OTHER): Payer: Medicare HMO

## 2022-01-25 DIAGNOSIS — Z79899 Other long term (current) drug therapy: Secondary | ICD-10-CM

## 2022-01-25 DIAGNOSIS — E785 Hyperlipidemia, unspecified: Secondary | ICD-10-CM

## 2022-01-25 DIAGNOSIS — Z125 Encounter for screening for malignant neoplasm of prostate: Secondary | ICD-10-CM | POA: Diagnosis not present

## 2022-01-25 DIAGNOSIS — R7309 Other abnormal glucose: Secondary | ICD-10-CM | POA: Diagnosis not present

## 2022-01-25 LAB — HEPATIC FUNCTION PANEL
ALT: 14 U/L (ref 0–53)
AST: 17 U/L (ref 0–37)
Albumin: 4 g/dL (ref 3.5–5.2)
Alkaline Phosphatase: 98 U/L (ref 39–117)
Bilirubin, Direct: 0.2 mg/dL (ref 0.0–0.3)
Total Bilirubin: 0.7 mg/dL (ref 0.2–1.2)
Total Protein: 6.4 g/dL (ref 6.0–8.3)

## 2022-01-25 LAB — LIPID PANEL
Cholesterol: 73 mg/dL (ref 0–200)
HDL: 27 mg/dL — ABNORMAL LOW (ref >39.00)
LDL Cholesterol: 31 mg/dL (ref 0–99)
NonHDL: 45.78
Total CHOL/HDL Ratio: 3
Triglycerides: 73 mg/dL (ref 0.0–149.0)
VLDL: 14.6 mg/dL (ref 0.0–40.0)

## 2022-01-25 LAB — BASIC METABOLIC PANEL
BUN: 16 mg/dL (ref 6–23)
CO2: 26 mEq/L (ref 19–32)
Calcium: 9.2 mg/dL (ref 8.4–10.5)
Chloride: 105 mEq/L (ref 96–112)
Creatinine, Ser: 1.13 mg/dL (ref 0.40–1.50)
GFR: 65.6 mL/min (ref >60.00)
Glucose, Bld: 103 mg/dL — ABNORMAL HIGH (ref 70–99)
Potassium: 4.2 mEq/L (ref 3.5–5.1)
Sodium: 140 mEq/L (ref 135–145)

## 2022-01-25 LAB — CBC WITH DIFFERENTIAL/PLATELET
Basophils Absolute: 0 10*3/uL (ref 0.0–0.1)
Basophils Relative: 1 % (ref 0.0–3.0)
Eosinophils Absolute: 0.1 10*3/uL (ref 0.0–0.7)
Eosinophils Relative: 1.3 % (ref 0.0–5.0)
HCT: 45.4 % (ref 39.0–52.0)
Hemoglobin: 15.4 g/dL (ref 13.0–17.0)
Lymphocytes Relative: 25.7 % (ref 12.0–46.0)
Lymphs Abs: 1 10*3/uL (ref 0.7–4.0)
MCHC: 33.9 g/dL (ref 30.0–36.0)
MCV: 93.6 fl (ref 78.0–100.0)
Monocytes Absolute: 0.3 10*3/uL (ref 0.1–1.0)
Monocytes Relative: 8.5 % (ref 3.0–12.0)
Neutro Abs: 2.5 10*3/uL (ref 1.4–7.7)
Neutrophils Relative %: 63.5 % (ref 43.0–77.0)
Platelets: 97 10*3/uL — ABNORMAL LOW (ref 150.0–400.0)
RBC: 4.85 Mil/uL (ref 4.22–5.81)
RDW: 14.6 % (ref 11.5–15.5)
WBC: 3.9 10*3/uL — ABNORMAL LOW (ref 4.0–10.5)

## 2022-01-25 LAB — PSA, MEDICARE: PSA: 0.56 ng/ml (ref 0.10–4.00)

## 2022-01-25 LAB — HEMOGLOBIN A1C: Hgb A1c MFr Bld: 5.2 % (ref 4.6–6.5)

## 2022-02-01 ENCOUNTER — Encounter: Payer: Self-pay | Admitting: Family Medicine

## 2022-02-01 ENCOUNTER — Ambulatory Visit (INDEPENDENT_AMBULATORY_CARE_PROVIDER_SITE_OTHER)
Admission: RE | Admit: 2022-02-01 | Discharge: 2022-02-01 | Disposition: A | Discharge status: Home / Self Care | Payer: Medicare HMO | Source: Ambulatory Visit | Attending: Family Medicine | Admitting: Family Medicine

## 2022-02-01 ENCOUNTER — Encounter: Payer: Medicare HMO | Admitting: Family Medicine

## 2022-02-01 ENCOUNTER — Ambulatory Visit (INDEPENDENT_AMBULATORY_CARE_PROVIDER_SITE_OTHER): Payer: Medicare HMO | Admitting: Family Medicine

## 2022-02-01 VITALS — BP 114/60 | HR 65 | Temp 98.0°F | Ht 72.25 in | Wt 211.6 lb

## 2022-02-01 DIAGNOSIS — Z0000 Encounter for general adult medical examination without abnormal findings: Secondary | ICD-10-CM

## 2022-02-01 DIAGNOSIS — M1712 Unilateral primary osteoarthritis, left knee: Secondary | ICD-10-CM

## 2022-02-01 DIAGNOSIS — I25118 Atherosclerotic heart disease of native coronary artery with other forms of angina pectoris: Secondary | ICD-10-CM

## 2022-02-01 MED ORDER — NITROGLYCERIN 0.4 MG SL SUBL: SUBLINGUAL_TABLET | SUBLINGUAL | 0 refills | Status: DC

## 2022-02-01 NOTE — Progress Notes (Unsigned)
Ramsey Guadamuz T. Dajon Lazar, MD, [place] at [facility] [place], [address]  Phone: [phone]  FAX: [place] - 71 y.o. male  MRN [id number]  Date of Birth: [birth year]  Date: 02/01/2022  PCP: Owens Loffler, MD  Referral: Owens Loffler, MD  Chief Complaint  Patient presents with   Annual Exam    Part 2 ([place] done 09/07/21)   Patient Care Team: Owens Loffler, MD as PCP - General End, Harrell Gave, MD as PCP - Cardiology (Cardiology) Subjective:   Phillip [name] is a 71 y.o. pleasant patient who presents with the following:  Preventative Health Maintenance Visit:  Health Maintenance Summary Reviewed and updated, unless pt declines services.  Tobacco History Reviewed. Alcohol: No concerns, no excessive use Exercise Habits: playing golf, rides cart, limited some by knee pain on the L STD concerns: no risk or activity to increase risk Drug Use: None  Shingrix - will get at the pharmacy  Left knee: He has been having some intermittent aching and pain when he gets up in the morning and also when he is walking.  This is limiting him a little bit in terms of his exercise.  Does have some aching when he is playing golf, but he does ride in the car generally.  He will take some Aleve prior to playing golf and sometimes in the morning, this does seem to help.  No injury, mechanical locking up or functional giving way.  Health Maintenance  Topic Date Due   Zoster Vaccines- Shingrix (1 of 2) Never done   INFLUENZA VACCINE  03/16/2022   COLONOSCOPY (Pts 45-74yr Insurance coverage will need to be confirmed)  05/25/2023   TETANUS/TDAP  10/17/2030   Pneumonia Vaccine 71 Years old  Completed   COVID-19 Vaccine  Completed   Hepatitis C Screening  Completed   HPV VACCINES  Aged Out   Immunization History  Administered Date(s) Administered   Fluad Quad(high Dose 65+) 05/09/2019   H1N1 09/26/2008    Influenza Whole 09/26/2008   Influenza, High Dose Seasonal PF 05/31/2017, 06/14/2018, 06/04/2021   Influenza,inj,Quad PF,6+ Mos 05/30/2014, 06/07/2015, 05/10/2016   Influenza-Unspecified 05/16/2020   Moderna Covid-19 Vaccine Bivalent Booster 119yr& up 06/04/2021   PFIZER(Purple Top)SARS-COV-2 Vaccination 08/31/2019, 09/21/2019, 08/05/2020   Pneumococcal Conjugate-13 07/22/2016   Pneumococcal Polysaccharide-23 08/17/2007, 11/18/2017   Td 12/16/2008   Tdap 10/16/2020   Patient Active Problem List   Diagnosis Date Noted   Hyperlipidemia LDL goal <70 07/20/2017    Priority: Medium    Coronary artery disease involving native coronary artery of native heart without angina pectoris 11/02/2016    Priority: Medium    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 06/2016 Echo: EF 55-60%, no rwma, mild AI, triv MR/TR/PR, lipomatous hypertrophy of atrial septum;  b. 06/2016 Ex MV: EF 56%, large, partially reversible anteroseptal, inferoseptal, inferior,and apical perfusion defect - borderline for ischemia (TID 1.64) -  equivocal study;  c. 06/2016 PCI: LM nl, LAD 25p/67m(3.0x24 Promus Premier MR DES), LCX min irregs, RCA nl.   Colon polyps    a. Severe dysplasia in [place]--> final pathology showed this was not a cancer.  Follow up colonoscopy at one year interval was normal.   Erectile dysfunction    GERD (gastroesophageal reflux disease)    H/o Barrett's esophagus, orginally  diagnosed in 2006, biopsies showed no dysplasia; follow up EGD also showed no dysplasia   History of BPH    Hyperlipidemia LDL goal <70 07/20/2017   Lyme disease    x2    Past Surgical History:  Procedure Laterality Date   CARDIAC CATHETERIZATION N/A 07/14/2016   Procedure: Left Heart Cath and Coronary Angiography;  Surgeon: MSherren Mocha MD;  [facility];  Service: Cardiovascular;  Laterality: N/A;   CARDIAC CATHETERIZATION N/A 07/14/2016   Procedure: Coronary Stent Intervention;  Surgeon: MSherren Mocha MD;  [facility];  Service: Cardiovascular;  Laterality: N/A;   COLONOSCOPY  12/2008   Repeat 5 years   ESOPHAGOGASTRODUODENOSCOPY  12/2008   Barrett's, repeat 3 years   HEMICOLECTOMY  2006   Right, for severely dysplastic right colon polyp   HERNIA REPAIR  23790  UMBILICAL   [place]BAR [place]  L4-5   TONSILLECTOMY     As a child   TUMOR REMOVAL  2002   Fatty tumor (neck)   UPPER GASTROINTESTINAL ENDOSCOPY     VARICOSE VEIN SURGERY  1997   Removal, right    Family History  Problem Relation Age of Onset   Hypertension Father    Diabetes Father    Heart disease Father    Lymphoma Mother    Alcohol abuse Paternal Grandfather     Social History   Social History Narrative   [place]    Past Medical History, Surgical History, Social History, Family History, Problem List, Medications, and Allergies have been reviewed and updated if relevant.  Review of Systems: Pertinent positives are listed above.  Otherwise, a full 14 point review of systems has been done in full and it is negative except where it is noted positive.  Objective:   BP 114/60   Pulse 65   Temp 98 F (36.7 C) (Oral)   Ht 6' 0.25" (1.835 m)   Wt 211 lb 9 oz (96 kg)   SpO2 99%   BMI 28.49 kg/m  Ideal Body Weight: Weight in (lb) to have BMI = 25: 185.2  Ideal Body Weight: Weight in (lb) to have  BMI = 25: 185.2 No results found.    09/07/2021    2:03 PM 09/05/2020    2:11 PM 05/02/2019    2:05 PM 11/18/2017    8:14 AM 10/19/2016   11:26 AM  Depression screen PHQ 2/9  Decreased Interest 0 0 0 0   Down, Depressed, Hopeless 0 0 0 0   PHQ - 2 Score 0 0 0 0   Altered sleeping  0 0 0 0  Tired, decreased energy  0 0 0 1  Change in appetite  0 0 0 0  Feeling bad or failure about yourself   0 0 0 0  Trouble concentrating  0 0 0 0  Moving slowly or fidgety/restless  0 0 0 0  Suicidal thoughts  0 0 0 0  PHQ-9 Score  0 0 0   Difficult doing work/chores  Not difficult at all Not difficult at all Not difficult at all Not difficult at all     GEN: well developed, well nourished, no acute distress Eyes: conjunctiva and lids normal, PERRLA, EOMI ENT: TM clear, nares clear, oral exam WNL Neck: supple, no lymphadenopathy, no thyromegaly, no JVD Pulm: clear to auscultation and percussion, respiratory effort normal CV: regular rate and rhythm, S1-S2, no murmur, rub or gallop, no bruits, peripheral pulses normal and symmetric, no cyanosis, clubbing, edema or varicosities GI: soft, non-tender; no hepatosplenomegaly, masses; active bowel sounds all quadrants GU: deferred Lymph: no cervical, axillary or inguinal adenopathy  Left knee: He lacks 2 degrees of extension and he is able to flex and medial 115 degrees.  There is some mild crepitus with motion at the patella. Stable to varus and valgus stress.  ACL and PCL are intact. Does have medial and lateral joint line tenderness, relatively mild. Anserine bursa is nontender. Does have some pain with any form of terminal flexion.  SKIN: clear, good turgor, color WNL, no rashes, lesions, or ulcerations Neuro: normal mental status, normal strength, sensation, and motion Psych: alert; oriented to person, place and time, normally interactive and not anxious or depressed in appearance.  All labs reviewed with patient. Results for orders placed or  performed in visit on 01/25/22  PSA, Medicare  Result Value Ref Range   PSA 0.56 0.10 - 4.00 ng/ml  Hemoglobin A1c  Result Value Ref Range   Hgb A1c MFr Bld 5.2 4.6 - 6.5 %  CBC with Differential/Platelet  Result Value Ref Range   WBC 3.9 (L) 4.0 - 10.5 K/uL   RBC 4.85 4.22 - 5.81 Mil/uL   Hemoglobin 15.4 13.0 - 17.0 g/dL   HCT 45.4 39.0 - 52.0 %   MCV 93.6 78.0 - 100.0 fl   MCHC 33.9 30.0 - 36.0 g/dL   RDW 14.6 11.5 - 15.5 %   Platelets 97.0 (L) 150.0 - 400.0 K/uL   Neutrophils Relative % 63.5 43.0 - 77.0 %   Lymphocytes Relative 25.7 12.0 - 46.0 %   Monocytes Relative 8.5 3.0 - 12.0 %   Eosinophils Relative 1.3 0.0 - 5.0 %   Basophils Relative 1.0 0.0 - 3.0 %   Neutro Abs 2.5 1.4 - 7.7 K/uL   Lymphs Abs 1.0 0.7 - 4.0 K/uL   Monocytes Absolute 0.3 0.1 - 1.0 K/uL   Eosinophils Absolute 0.1 0.0 - 0.7 K/uL   Basophils Absolute 0.0 0.0 - 0.1 K/uL  Basic metabolic panel  Result Value Ref Range   Sodium 140 135 - 145 mEq/L   Potassium 4.2 3.5 - 5.1 mEq/L   Chloride 105 96 - 112 mEq/L   CO2 26 19 - 32 mEq/L   Glucose, Bld 103 (H) 70 - 99 mg/dL   BUN 16 6 - 23 mg/dL   Creatinine, Ser 1.13 0.40 - 1.50 mg/dL   GFR 65.60 >60.00 mL/min   Calcium 9.2 8.4 - 10.5 mg/dL  Hepatic function panel  Result Value Ref Range   Total Bilirubin 0.7 0.2 - 1.2 mg/dL   Bilirubin, Direct 0.2 0.0 - 0.3 mg/dL   Alkaline Phosphatase 98 39 - 117 U/L   AST 17 0 - 37 U/L   ALT 14 0 - 53 U/L   Total Protein 6.4 6.0 - 8.3 g/dL   Albumin 4.0 3.5 - 5.2 g/dL  Lipid panel  Result Value Ref Range   Cholesterol 73 0 -  200 mg/dL   Triglycerides 73.0 0.0 - 149.0 mg/dL   HDL 27.00 (L) >39.00 mg/dL   VLDL 14.6 0.0 - 40.0 mg/dL   LDL Cholesterol 31 0 - 99 mg/dL   Total CHOL/HDL Ratio 3    NonHDL 45.78     Assessment and Plan:     ICD-10-CM   1. Healthcare maintenance  Z00.00     2. Coronary artery disease of native artery of native heart with stable angina pectoris (HCC)  I25.118 nitroGLYCERIN  (NITROSTAT) 0.4 MG SL tablet    3. Primary osteoarthritis of left knee  M17.12 DG Knee 4 Views W/Patella Left     He will get his Shingrix vaccine. Otherwise he is up-to-date in terms of his health maintenance.  Left knee osteoarthritis.  We did talk about arthritis in general.  I do think taking some Aleve twice daily is certainly reasonable.  He could up the dose to 220 mg to 440 mg twice daily without much significant problem.  Can also take Tylenol and ice his knee at the end of the day or after playing golf.  At this point, I do not think he really needs to do anything additional at this.  Independent review of x-rays, weightbearing knee films.  On the AP and the Lutricia Feil view, there is some osteophytosis and joint space narrowing.  This appears to be more pronounced on the lateral compartment.  On the lateral view, there is some decreased joint space and early osteophytosis.Electronically Signed  By: Owens Loffler, MD On: 02/01/2022  3:20 PM EDT   Health Maintenance Exam: The patient's preventative maintenance and recommended screening tests for an annual wellness exam were reviewed in full today. Brought up to date unless services declined.  Counselled on the importance of diet, exercise, and its role in overall health and mortality. The patient's FH and SH was reviewed, including their home life, tobacco status, and drug and alcohol status.  Follow-up in 1 year for physical exam or additional follow-up below.  Follow-up: No follow-ups on file. Or follow-up in 1 year if not noted.  Meds ordered this encounter  Medications   nitroGLYCERIN (NITROSTAT) 0.4 MG SL tablet    Sig: PLACE 1 TABLET UNDER THE TONGUE EVERY 5 MINUTES AS NEEDED FOR CHEST PAIN. MAX 3 DOSES    Dispense:  25 tablet    Refill:  0   Medications Discontinued During This Encounter  Medication Reason   Cholecalciferol (VITAMIN D3) 1.25 MG (50000 UT) CAPS Entry Error   nitroGLYCERIN (NITROSTAT) 0.4 MG SL tablet  Reorder   Orders Placed This Encounter  Procedures   DG Knee 4 Views W/Patella Left    Signed,  Dak Szumski T. Cristi Gwynn, MD   Allergies as of 02/01/2022       Reactions   Penicillins    REACTION: Rash as a child - Tolerated AMOXICILLIN SEVERAL TIMES AS ADULT RECENTLY Has patient had a PCN reaction causing immediate rash, facial/tongue/throat swelling, SOB or lightheadedness with hypotension: YES Has patient had a PCN reaction causing severe rash involving mucus membranes or skin necrosis:NO Has patient had a PCN reaction that required hospitalization NO Has patient had a PCN reaction occurring within the last 10 years: NO If all of the above answers are "NO", then may proceed with         Medication List        Accurate as of February 01, 2022 11:59 PM. If you have any questions, ask your nurse or doctor.  aspirin EC 81 MG tablet Take 1 tablet (81 mg total) by mouth daily.   atorvastatin 10 MG tablet Commonly known as: LIPITOR TAKE 1 TABLET BY MOUTH EVERY DAY   CO Q-10 PO Take 1 tablet by mouth daily.   folic acid 194 MCG tablet Commonly known as: FOLVITE Take 400 mcg by mouth daily.   GLUCOSAMINE PO Take 1,500 mg by mouth daily.   Magnesium 250 MG Tabs Take by mouth.   nitroGLYCERIN 0.4 MG SL tablet Commonly known as: NITROSTAT PLACE 1 TABLET UNDER THE TONGUE EVERY 5 MINUTES AS NEEDED FOR CHEST PAIN. MAX 3 DOSES   omeprazole 20 MG tablet Commonly known as: PRILOSEC OTC Take 20 mg by mouth daily.   saw palmetto 80 MG capsule Take 80 mg by mouth daily.   vitamin B-12 500 MCG tablet Commonly known as: CYANOCOBALAMIN Take 1 tablet (500 mcg total) by mouth daily.   VITAMIN C PO Take 1 tablet by mouth daily.   ZINC PO Take 1 tablet by mouth daily.

## 2022-02-02 ENCOUNTER — Encounter: Payer: Self-pay | Admitting: Family Medicine

## 2022-04-11 ENCOUNTER — Other Ambulatory Visit: Payer: Self-pay | Admitting: Family Medicine

## 2022-05-18 DIAGNOSIS — I87393 Chronic venous hypertension (idiopathic) with other complications of bilateral lower extremity: Secondary | ICD-10-CM | POA: Diagnosis not present

## 2022-05-18 DIAGNOSIS — R600 Localized edema: Secondary | ICD-10-CM | POA: Diagnosis not present

## 2022-05-18 DIAGNOSIS — I872 Venous insufficiency (chronic) (peripheral): Secondary | ICD-10-CM | POA: Diagnosis not present

## 2022-05-18 DIAGNOSIS — M79605 Pain in left leg: Secondary | ICD-10-CM | POA: Diagnosis not present

## 2022-05-18 DIAGNOSIS — L819 Disorder of pigmentation, unspecified: Secondary | ICD-10-CM | POA: Diagnosis not present

## 2022-06-18 DIAGNOSIS — D485 Neoplasm of uncertain behavior of skin: Secondary | ICD-10-CM | POA: Diagnosis not present

## 2022-06-18 DIAGNOSIS — D0439 Carcinoma in situ of skin of other parts of face: Secondary | ICD-10-CM | POA: Diagnosis not present

## 2022-06-22 DIAGNOSIS — I83892 Varicose veins of left lower extremities with other complications: Secondary | ICD-10-CM | POA: Diagnosis not present

## 2022-07-13 DIAGNOSIS — I83812 Varicose veins of left lower extremities with pain: Secondary | ICD-10-CM | POA: Diagnosis not present

## 2022-07-13 DIAGNOSIS — I83892 Varicose veins of left lower extremities with other complications: Secondary | ICD-10-CM | POA: Diagnosis not present

## 2022-07-21 ENCOUNTER — Other Ambulatory Visit
Admission: RE | Admit: 2022-07-21 | Discharge: 2022-07-21 | Disposition: A | Discharge status: Home / Self Care | Payer: Medicare HMO | Attending: Internal Medicine | Admitting: Internal Medicine

## 2022-07-21 ENCOUNTER — Ambulatory Visit: Payer: Medicare HMO | Attending: Internal Medicine | Admitting: Internal Medicine

## 2022-07-21 ENCOUNTER — Encounter: Payer: Self-pay | Admitting: Internal Medicine

## 2022-07-21 VITALS — BP 130/62 | HR 55 | Ht 73.0 in | Wt 216.0 lb

## 2022-07-21 DIAGNOSIS — I2510 Atherosclerotic heart disease of native coronary artery without angina pectoris: Secondary | ICD-10-CM | POA: Diagnosis not present

## 2022-07-21 DIAGNOSIS — R001 Bradycardia, unspecified: Secondary | ICD-10-CM | POA: Diagnosis not present

## 2022-07-21 DIAGNOSIS — E785 Hyperlipidemia, unspecified: Secondary | ICD-10-CM | POA: Diagnosis not present

## 2022-07-21 LAB — TSH: TSH: 3.84 u[IU]/mL (ref 0.350–4.500)

## 2022-07-21 NOTE — Progress Notes (Signed)
Follow-up Outpatient Visit Date: 07/21/2022  Primary Care Provider: Owens Loffler, MD [place] [address]  Chief Complaint: Follow-up coronary artery disease  HPI:  Phillip [name] is a 71 y.o. male with history of coronary artery disease status post PCI to the mid LAD in the setting of unstable angina in 06/2016, Barrett's esophagus, and lower extremity venous insufficienc, who presents for follow-up of coronary artery disease.  He was last seen in our office in 05/2021 by Cadence Furth, PA, at which time he was feeling well.  No medication changes or additional testing were pursued.  Today, Phillip [name] reports that he continues to feel well.  He has noticed that his resting heart rate is a little bit lower this year, often in the 60s.  However, he denies associated symptoms.  He has not had any chest pain, shortness of breath, or palpitations.  He notes only one episode of transient dizziness and weakness over the last year.  He has undergone varicose vein treatments over the last year with significant improvement in his leg pain and swelling.  He intermittently exercises at the gym.  --------------------------------------------------------------------------------------------------  Cardiovascular History & Procedures: Cardiovascular Problems: Coronary artery disease status post PCI to the mid LAD in the setting of unstable angina (06/2016) Chronic venous insufficiency   Risk Factors: Known coronary artery disease, male gender, and age greater than 72   Cath/PCI: LHC/PCI (07/14/16): LMCA normal. Ostial LAD with 25% stenosis. Mid LAD with 95% lesion. LCx with minimal diffuse disease. RCA normal. Successful PCI to the mid LAD with placement of a Promus Premier 3.0 x 24 mm drug-eluting stent.   CV Surgery: None   EP Procedures and Devices: None   Non-Invasive Evaluation(s): Exercise tolerance test (08/02/2017): Low risk study without ischemia. TTE  (07/13/2016): Normal LV size and function. LVEF 55-60%. Mild AI. Trivial MR. Lipomatous hypertrophy of the intra-atrial septum. Trivial TR. Trivial PR. Normal RV size and function.  Recent CV Pertinent Labs: Lab Results  Component Value Date   CHOL 73 01/25/2022   CHOL 67 (L) 08/25/2016   HDL 27.00 (L) 01/25/2022   HDL 26 (L) 08/25/2016   LDLCALC 31 01/25/2022   LDLCALC 22 08/25/2016   TRIG 73.0 01/25/2022   CHOLHDL 3 01/25/2022   INR 1.13 07/14/2016   K 4.2 01/25/2022   BUN 16 01/25/2022   CREATININE 1.13 01/25/2022    Past medical and surgical history were reviewed and updated in EPIC.  No outpatient medications have been marked as taking for the 07/21/22 encounter (Office Visit) with Nicol Herbig, Harrell Gave, MD.    Allergies: Penicillins  Social History   Tobacco Use   Smoking status: Never   Smokeless tobacco: Never  Vaping Use   Vaping Use: Never used  Substance Use Topics   Alcohol use: No   Drug use: No    Family History  Problem Relation Age of Onset   Hypertension Father    Diabetes Father    Heart disease Father    Lymphoma Mother    Alcohol abuse Paternal Grandfather     Review of Systems: A 12-system review of systems was performed and was negative except as noted in the HPI.  --------------------------------------------------------------------------------------------------  Physical Exam: BP 130/62 (BP Location: Left Arm, Patient Position: Sitting, Cuff Size: Normal)   Pulse (!) 55   Ht '6\' 1"'$  (1.854 m)   Wt 216 lb (98 kg)   BMI 28.50 kg/m   General:  NAD. Neck: No JVD or HJR.  Lungs: Clear to auscultation bilaterally without wheezes or crackles. Heart: Bradycardic but regular without murmurs, rubs, or gallops. Abdomen: Soft, nontender, nondistended. Extremities: Trace pretibial edema bilaterally.  EKG: Sinus bradycardia.  Otherwise, no significant abnormalities; no changes from prior tracing on 06/02/2021.  Lab Results  Component Value Date    WBC 3.9 (L) 01/25/2022   HGB 15.4 01/25/2022   HCT 45.4 01/25/2022   MCV 93.6 01/25/2022   PLT 97.0 (L) 01/25/2022    Lab Results  Component Value Date   NA 140 01/25/2022   K 4.2 01/25/2022   CL 105 01/25/2022   CO2 26 01/25/2022   BUN 16 01/25/2022   CREATININE 1.13 01/25/2022   GLUCOSE 103 (H) 01/25/2022   ALT 14 01/25/2022    Lab Results  Component Value Date   CHOL 73 01/25/2022   HDL 27.00 (L) 01/25/2022   LDLCALC 31 01/25/2022   TRIG 73.0 01/25/2022   CHOLHDL 3 01/25/2022    --------------------------------------------------------------------------------------------------  ASSESSMENT AND PLAN: Coronary artery disease: No angina reported.  Continue secondary prevention with aspirin and atorvastatin 10 mg daily (LDL very well-controlled on low-dose statin).  Sinus bradycardia: Asymptomatic and present for at least a year.  We will check a TSH today to ensure that hypothyroidism is not continuing.  Given the lack of symptoms and reported normal chronotropic response to exercise, no further intervention is indicated at this time.  Hyperlipidemia: LDL well-controlled on last check in 01/2022.  Continue atorvastatin.  Follow-up: Return to clinic in 1 year.  Nelva Bush, MD 07/21/2022 2:52 PM

## 2022-07-21 NOTE — Patient Instructions (Signed)
Medication Instructions:  Your Physician recommend you continue on your current medication as directed.    *If you need a refill on your cardiac medications before your next appointment, please call your pharmacy*   Lab Work: Your provider would like for you to have following labs drawn today: (TSH).   Please go to the [facility] entrance and check in at the front desk.  You do not need an appointment.  They are open from 7am-6 pm.   If you have labs (blood work) drawn today and your tests are completely normal, you will receive your results only by: [place] (if you have MyChart) OR A paper copy in the mail If you have any lab test that is abnormal or we need to change your treatment, we will call you to review the results.   Testing/Procedures: None ordered today   Follow-Up: At [facility], you and your health needs are our priority.  As part of our continuing mission to provide you with exceptional heart care, we have created designated Provider Care Teams.  These Care Teams include your primary Cardiologist (physician) and Advanced Practice Providers (APPs -  Physician Assistants and Nurse Practitioners) who all work together to provide you with the care you need, when you need it.  We recommend signing up for the patient portal called "MyChart".  Sign up information is provided on this After Visit Summary.  MyChart is used to connect with patients for Virtual Visits (Telemedicine).  Patients are able to view lab/test results, encounter notes, upcoming appointments, etc.  Non-urgent messages can be sent to your provider as well.   To learn more about what you can do with MyChart, go to [email or web address].    Your next appointment:   1 year(s)  The format for your next appointment:   In Person  Provider:   You may see Nelva Bush, MD or one of the following Advanced Practice Providers on your designated Care Team:   Murray Hodgkins, NP Christell Faith, PA-C Cadence Kathlen Mody, PA-C Gerrie Nordmann, NP

## 2022-08-03 DIAGNOSIS — D0439 Carcinoma in situ of skin of other parts of face: Secondary | ICD-10-CM | POA: Diagnosis not present

## 2022-09-08 ENCOUNTER — Ambulatory Visit (INDEPENDENT_AMBULATORY_CARE_PROVIDER_SITE_OTHER): Payer: Medicare HMO

## 2022-09-08 VITALS — Wt 216.0 lb

## 2022-09-08 DIAGNOSIS — Z0000 Encounter for general adult medical examination without abnormal findings: Secondary | ICD-10-CM

## 2022-09-08 NOTE — Progress Notes (Signed)
Virtual Visit via Telephone Note  I connected with  Phillip [name] on 09/08/22 at  2:00 PM EST by telephone and verified that I am speaking with the correct person using two identifiers.  Location: Patient: home Provider: [place] Persons participating in the virtual visit: [place]   I discussed the limitations, risks, security and privacy concerns of performing an evaluation and management service by telephone and the availability of in person appointments. The patient expressed understanding and agreed to proceed.  Interactive audio and video telecommunications were attempted between this nurse and patient, however failed, due to patient having technical difficulties OR patient did not have access to video capability.  We continued and completed visit with audio only.  Some vital signs may be absent or patient reported.   Dionisio David, LPN  Subjective:   Phillip [name] is a 72 y.o. male who presents for Medicare Annual/Subsequent preventive examination.  Review of Systems     Cardiac Risk Factors include: advanced age (>34mn, >>9women);dyslipidemia;male gender     Objective:    Today's Vitals   09/08/22 1356  PainSc: 3    There is no height or weight on file to calculate BMI.     09/08/2022    2:03 PM 09/07/2021    2:00 PM 05/02/2019    2:04 PM 11/18/2017    8:15 AM 08/02/2016    2:21 PM 07/13/2016    6:00 AM 07/12/2016    2:45 PM  Advanced Directives  Does Patient Have a Medical Advance Directive? No No No No No No No  Would patient like information on creating a medical advance directive? No - Patient declined Yes (MAU/Ambulatory/Procedural Areas - Information given) No - Patient declined Yes (MAU/Ambulatory/Procedural Areas - Information given) Yes (MAU/Ambulatory/Procedural Areas - Information given) No - Patient declined No - Patient declined    Current Medications (verified) Outpatient Encounter Medications as of 09/08/2022   Medication Sig   Ascorbic Acid (VITAMIN C PO) Take 1 tablet by mouth daily.   aspirin EC 81 MG tablet Take 1 tablet (81 mg total) by mouth daily.   atorvastatin (LIPITOR) 10 MG tablet TAKE 1 TABLET BY MOUTH EVERY DAY   cholecalciferol (VITAMIN D3) 10 MCG/ML LIQD oral liquid Take 400 Units by mouth daily.   Coenzyme Q10 (CO Q-10 PO) Take 1 tablet by mouth daily.   Glucosamine HCl (GLUCOSAMINE PO) Take 1,500 mg by mouth daily.   Magnesium 250 MG TABS Take by mouth.   Multiple Vitamins-Minerals (ZINC PO) Take 1 tablet by mouth daily.   nitroGLYCERIN (NITROSTAT) 0.4 MG SL tablet PLACE 1 TABLET UNDER THE TONGUE EVERY 5 MINUTES AS NEEDED FOR CHEST PAIN. MAX 3 DOSES   omeprazole (PRILOSEC OTC) 20 MG tablet Take 20 mg by mouth daily.   saw palmetto 80 MG capsule Take 80 mg by mouth daily.   vitamin B-12 500 MCG tablet Take 1 tablet (500 mcg total) by mouth daily.   folic acid (FOLVITE) 8809MCG tablet Take 400 mcg by mouth daily. (Patient not taking: Reported on 09/08/2022)   No facility-administered encounter medications on file as of 09/08/2022.    Allergies (verified) Penicillins   History: Past Medical History:  Diagnosis Date   Allergic rhinitis    BARRETTS ESOPHAGUS 12/16/2008   Qualifier: Diagnosis of  By: ILia HoppingLPN, Rena     CAD (coronary artery disease)    a. 06/2016 Echo: EF 55-60%, no rwma, mild AI, triv MR/TR/PR, lipomatous hypertrophy of atrial  septum;  b. 06/2016 Ex MV: EF 56%, large, partially reversible anteroseptal, inferoseptal, inferior,and apical perfusion defect - borderline for ischemia (TID 1.64) - equivocal study;  c. 06/2016 PCI: LM nl, LAD 25p/51m(3.0x24 Promus Premier MR DES), LCX min irregs, RCA nl.   Colon polyps    a. Severe dysplasia in [place] final pathology showed this was not a cancer.  Follow up colonoscopy at one year interval was normal.   Erectile dysfunction    GERD (gastroesophageal reflux disease)     H/o Barrett's esophagus, orginally diagnosed in 2006, biopsies showed no dysplasia; follow up EGD also showed no dysplasia   History of BPH    Hyperlipidemia LDL goal <70 07/20/2017   Lyme disease    x2   Past Surgical History:  Procedure Laterality Date   CARDIAC CATHETERIZATION N/A 07/14/2016   Procedure: Left Heart Cath and Coronary Angiography;  Surgeon: MSherren Mocha MD;  [facility];  Service: Cardiovascular;  Laterality: N/A;   CARDIAC CATHETERIZATION N/A 07/14/2016   Procedure: Coronary Stent Intervention;  Surgeon: MSherren Mocha MD;  [facility];  Service: Cardiovascular;  Laterality: N/A;   COLONOSCOPY  12/14/2008   Repeat 5 years   ESOPHAGOGASTRODUODENOSCOPY  12/14/2008   Barrett's, repeat 3 years   HEMICOLECTOMY  08/16/2004   Right, for severely dysplastic right colon polyp   HERNIA REPAIR  [id number]  UMBILICAL   LUMBAR [facility]  08/16/1990   L4-5   TONSILLECTOMY     As a child   TUMOR REMOVAL  08/16/2000   Fatty tumor (neck)   UPPER GASTROINTESTINAL ENDOSCOPY     VARICOSE VEIN SURGERY  08/17/1995   Removal, right   VEIN SURGERY  2022   Family History  Problem Relation Age of Onset   Hypertension Father    Diabetes Father    Heart disease Father    Lymphoma Mother    Alcohol abuse Paternal Grandfather    Social History   Socioeconomic History   Marital status: Married    Spouse name: Not on file   Number of children: 3   Years of education: Not on file   Highest education level: Not on file  Occupational History   Occupation: PTheme park manager BPersonnel officer   Employer: [employer]  Tobacco Use   Smoking status: Never   Smokeless tobacco: Never  Vaping Use   Vaping Use: Never used  Substance and Sexual Activity   Alcohol use: No   Drug use: No   Sexual activity: Not on file  Other Topics Concern   Not on file  Social History Narrative   [place]  PTheatre stage manager   Social Determinants of Health   Financial Resource Strain: Low Risk  (09/08/2022)   Overall Financial Resource Strain (CARDIA)    Difficulty of Paying Living Expenses: Not hard at all  Food Insecurity: No Food Insecurity (09/08/2022)   Hunger Vital Sign    Worried About Running Out of Food in the Last Year: Never true    Ran Out of Food in the Last Year: Never true  Transportation Needs: No Transportation Needs (09/08/2022)   PRAPARE - THydrologist(Medical): No    Lack of Transportation (Non-Medical): No  Physical Activity: Insufficiently Active (09/08/2022)   Exercise Vital Sign    Days of Exercise per Week: 2 days    Minutes of  Exercise per Session: 60 min  Stress: No Stress Concern Present (09/08/2022)   [place]    Feeling of Stress : Not at all  Social Connections: [place] (09/08/2022)   Social Connection and Isolation Panel [NHANES]    Frequency of Communication with Friends and Family: More than three times a week    Frequency of Social Gatherings with Friends and Family: More than three times a week    Attends Religious Services: More than 4 times per year    Active Member of [employer] or Organizations: Yes    Attends Music therapist: More than 4 times per year    Marital Status: Married    Tobacco Counseling Counseling given: Not Answered   Clinical Intake:  Pre-visit preparation completed: Yes  Pain : 0-10 Pain Score: 3  Pain Type: Chronic pain Pain Location: Hip Pain Orientation: Right Pain Relieving Factors: alever once every 3 days, oils, or ice- no significant changes  Pain Relieving Factors: alever once every 3 days, oils, or ice- no significant changes  Nutritional Risks: None Diabetes: No  How often do you need to have someone help you when you read instructions, pamphlets, or other written materials from your doctor or  pharmacy?: 1 - Never  Diabetic?no  Interpreter Needed?: No  Information entered by :: Kirke Shaggy, LPN   Activities of Daily Living    09/08/2022    2:03 PM 09/07/2022    4:47 PM  In your present state of health, do you have any difficulty performing the following activities:  Hearing? 0 0  Vision? 0 0  Difficulty concentrating or making decisions? 0 0  Walking or climbing stairs? 0 0  Dressing or bathing? 0 0  Doing errands, shopping? 0 0  Preparing Food and eating ? N N  Using the Toilet? N N  In the past six months, have you accidently leaked urine? N N  Do you have problems with loss of bowel control? N N  Managing your Medications? N N  Managing your Finances? N N  Housekeeping or managing your Housekeeping? N N    Patient Care Team: Owens Loffler, MD as PCP - General End, Harrell Gave, MD as PCP - Cardiology (Cardiology)  Indicate any recent Medical Services you may have received from other than [facility] providers in the past year (date may be approximate).     Assessment:   This is a routine wellness examination for Phillip [name].  Hearing/Vision screen Hearing Screening - Comments:: No aids Vision Screening - Comments:: Wears glasses- Dr.Glosson  Dietary issues and exercise activities discussed: Current Exercise Habits: Home exercise routine, Type of exercise: walking, Time (Minutes): 60, Frequency (Times/Week): 2, Weekly Exercise (Minutes/Week): 120, Intensity: Mild   Goals Addressed             This Visit's Progress    DIET - EAT MORE FRUITS AND VEGETABLES         Depression Screen    09/08/2022    2:01 PM 09/07/2021    2:03 PM 09/05/2020    2:11 PM 05/02/2019    2:05 PM 11/18/2017    8:14 AM 08/02/2016    2:07 PM  PHQ 2/9 Scores  PHQ - 2 Score 0 0 0 0 0 0  PHQ- 9 Score   0 0 0 1    Fall Risk    09/08/2022    2:03 PM 09/07/2022    4:47 PM 09/07/2021    2:01 PM  09/05/2020    2:10 PM 05/02/2019    2:05 PM  Fall Risk   Falls in the past year? 1 1 0 0 0   Number falls in past yr: 0 0 0 0 0  Injury with Fall? 0 0 0 0   Risk for fall due to : No Fall Risks   No Fall Risks Medication side effect  Follow up Falls prevention discussed;Falls evaluation completed  Falls prevention discussed Falls evaluation completed;Falls prevention discussed Falls evaluation completed;Falls prevention discussed    FALL RISK PREVENTION PERTAINING TO THE HOME:  Any stairs in or around the home? Yes  If so, are there any without handrails? No  Home free of loose throw rugs in walkways, pet beds, electrical cords, etc? Yes  Adequate lighting in your home to reduce risk of falls? Yes   ASSISTIVE DEVICES UTILIZED TO PREVENT FALLS:  Life alert? No  Use of a cane, walker or w/c? No  Grab bars in the bathroom? No  Shower chair or bench in shower? Yes  Elevated toilet seat or a handicapped toilet? Yes    Cognitive Function:    09/05/2020    2:12 PM 05/02/2019    2:08 PM 11/18/2017    8:20 AM  MMSE - Mini Mental State Exam  Not completed: Refused    Orientation to time  5 5  Orientation to Place  5 5  Registration  3 3  Attention/ Calculation  5 0  Recall  3 3  Language- name 2 objects   0  Language- repeat  1 1  Language- follow 3 step command   3  Language- read & follow direction   0  Write a sentence   0  Copy design   0  Total score   20        09/08/2022    2:06 PM  6CIT Screen  What Year? 0 points  What month? 0 points  What time? 0 points  Count back from 20 0 points  Months in reverse 0 points  Repeat phrase 0 points  Total Score 0 points    Immunizations Immunization History  Administered Date(s) Administered   Fluad Quad(high Dose 65+) 05/09/2019   H1N1 09/26/2008   Influenza Whole 09/26/2008   Influenza, High Dose Seasonal PF 05/31/2017, 06/14/2018, 06/04/2021, 05/14/2022   Influenza,inj,Quad PF,6+ Mos 05/30/2014, 06/07/2015, 05/10/2016   Influenza-Unspecified 05/16/2020   Moderna Covid-19 Vaccine Bivalent Booster 75yr & up  06/04/2021   PFIZER Comirnaty(Gray Top)Covid-19 Tri-Sucrose Vaccine 05/14/2022   PFIZER(Purple Top)SARS-COV-2 Vaccination 08/31/2019, 09/21/2019, 08/05/2020   Pneumococcal Conjugate-13 07/22/2016   Pneumococcal Polysaccharide-23 08/17/2007, 11/18/2017   Respiratory Syncytial Virus Vaccine,Recomb Aduvanted(Arexvy) 07/31/2022   Td 12/16/2008   Tdap 10/16/2020   Zoster Recombinat (Shingrix) 02/06/2022, 07/31/2022    TDAP status: Up to date  Flu Vaccine status: Up to date  Pneumococcal vaccine status: Up to date  Covid-19 vaccine status: Completed vaccines  Qualifies for Shingles Vaccine? Yes   Zostavax completed No   Shingrix Completed?: Yes  Screening Tests Health Maintenance  Topic Date Due   COVID-19 Vaccine (6 - 2023-24 season) 07/09/2022   COLONOSCOPY (Pts 45-48yrInsurance coverage will need to be confirmed)  05/25/2023   Medicare Annual Wellness (AWV)  09/09/2023   DTaP/Tdap/Td (3 - Td or Tdap) 10/17/2030   Pneumonia Vaccine 6587Years old  Completed   INFLUENZA VACCINE  Completed   Hepatitis C Screening  Completed   Zoster Vaccines- Shingrix  Completed   HPV VACCINES  Aged Out    Health Maintenance  Health Maintenance Due  Topic Date Due   COVID-19 Vaccine (6 - 2023-24 season) 07/09/2022    Colorectal cancer screening: Type of screening: Colonoscopy. Completed 05/24/18. Repeat every 5 years  Lung Cancer Screening: (Low Dose CT Chest recommended if Age 75-80 years, 30 pack-year currently smoking OR have quit w/in 15years.) does not qualify.    Additional Screening:  Hepatitis C Screening: does qualify; Completed 11/18/17  Vision Screening: Recommended annual ophthalmology exams for early detection of glaucoma and other disorders of the eye. Is the patient up to date with their annual eye exam?  Yes  Who is the provider or what is the name of the office in which the patient attends annual eye exams? Dr.Glosson If pt is not established with a provider, would  they like to be referred to a provider to establish care? No .   Dental Screening: Recommended annual dental exams for proper oral hygiene  Community Resource Referral / Chronic Care Management: CRR required this visit?  No   CCM required this visit?  No      Plan:     I have personally reviewed and noted the following in the patient's chart:   Medical and social history Use of alcohol, tobacco or illicit drugs  Current medications and supplements including opioid prescriptions. Patient is not currently taking opioid prescriptions. Functional ability and status Nutritional status Physical activity Advanced directives List of other physicians Hospitalizations, surgeries, and ER visits in previous 12 months Vitals Screenings to include cognitive, depression, and falls Referrals and appointments  In addition, I have reviewed and discussed with patient certain preventive protocols, quality metrics, and best practice recommendations. A written personalized care plan for preventive services as well as general preventive health recommendations were provided to patient.     Dionisio David, LPN   11/30/3843   Nurse Notes: none

## 2022-09-08 NOTE — Patient Instructions (Signed)
Phillip [name] , Thank you for taking time to come for your Medicare Wellness Visit. I appreciate your ongoing commitment to your health goals. Please review the following plan we discussed and let me know if I can assist you in the future.   These are the goals we discussed:  Goals      DIET - EAT MORE FRUITS AND VEGETABLES     Increase physical activity     Starting 11/18/2017, I will continue to exercise at gym for 90 minutes once weekly and to remain a Social research officer, government.      Patient Stated     05/02/19, Patient wants to increase exercise and get back to the gym      Patient Stated     09/05/2020, I will continue to go to the gym 1-2 days a week for about 1 1/2 hours.      Patient Stated     Would like to increase exercise routine and drink more water        This is a list of the screening recommended for you and due dates:  Health Maintenance  Topic Date Due   COVID-19 Vaccine (6 - 2023-24 season) 07/09/2022   Colon Cancer Screening  05/25/2023   Medicare Annual Wellness Visit  09/09/2023   DTaP/Tdap/Td vaccine (3 - Td or Tdap) 10/17/2030   Pneumonia Vaccine  Completed   Flu Shot  Completed   Hepatitis C Screening: USPSTF Recommendation to screen - Ages 18-79 yo.  Completed   Zoster (Shingles) Vaccine  Completed   HPV Vaccine  Aged Out    Advanced directives: no  Conditions/risks identified: none  Next appointment: Follow up in one year for your annual wellness visit. 09/12/23 @ 10:15 by phone  Preventive Care 65 Years and Older, Male  Preventive care refers to lifestyle choices and visits with your health care provider that can promote health and wellness. What does preventive care include? A yearly physical exam. This is also called an annual well check. Dental exams once or twice a year. Routine eye exams. Ask your health care provider how often you should have your eyes checked. Personal lifestyle choices, including: Daily care of your teeth and gums. Regular  physical activity. Eating a healthy diet. Avoiding tobacco and drug use. Limiting alcohol use. Practicing safe sex. Taking low doses of aspirin every day. Taking vitamin and mineral supplements as recommended by your health care provider. What happens during an annual well check? The services and screenings done by your health care provider during your annual well check will depend on your age, overall health, lifestyle risk factors, and family history of disease. Counseling  Your health care provider may ask you questions about your: Alcohol use. Tobacco use. Drug use. Emotional well-being. Home and relationship well-being. Sexual activity. Eating habits. History of falls. Memory and ability to understand (cognition). Work and work Statistician. Screening  You may have the following tests or measurements: Height, weight, and BMI. Blood pressure. Lipid and cholesterol levels. These may be checked every 5 years, or more frequently if you are over 43 years old. Skin check. Lung cancer screening. You may have this screening every year starting at age 2 if you have a 30-pack-year history of smoking and currently smoke or have quit within the past 15 years. Fecal occult blood test (FOBT) of the stool. You may have this test every year starting at age 65. Flexible sigmoidoscopy or colonoscopy. You may have a sigmoidoscopy every 5 years or a colonoscopy  every 10 years starting at age 32. Prostate cancer screening. Recommendations will vary depending on your family history and other risks. Hepatitis C blood test. Hepatitis B blood test. Sexually transmitted disease (STD) testing. Diabetes screening. This is done by checking your blood sugar (glucose) after you have not eaten for a while (fasting). You may have this done every 1-3 years. Abdominal aortic aneurysm (AAA) screening. You may need this if you are a current or former smoker. Osteoporosis. You may be screened starting at age 25  if you are at high risk. Talk with your health care provider about your test results, treatment options, and if necessary, the need for more tests. Vaccines  Your health care provider may recommend certain vaccines, such as: Influenza vaccine. This is recommended every year. Tetanus, diphtheria, and acellular pertussis (Tdap, Td) vaccine. You may need a Td booster every 10 years. Zoster vaccine. You may need this after age 63. Pneumococcal 13-valent conjugate (PCV13) vaccine. One dose is recommended after age 62. Pneumococcal polysaccharide (PPSV23) vaccine. One dose is recommended after age 95. Talk to your health care provider about which screenings and vaccines you need and how often you need them. This information is not intended to replace advice given to you by your health care provider. Make sure you discuss any questions you have with your health care provider. Document Released: 08/29/2015 Document Revised: 04/21/2016 Document Reviewed: 06/03/2015 Elsevier Interactive Patient Education  2017 [place] Prevention in the Home Falls can cause injuries. They can happen to people of all ages. There are many things you can do to make your home safe and to help prevent falls. What can I do on the outside of my home? Regularly fix the edges of walkways and driveways and fix any cracks. Remove anything that might make you trip as you walk through a door, such as a raised step or threshold. Trim any bushes or trees on the path to your home. Use bright outdoor lighting. Clear any walking paths of anything that might make someone trip, such as rocks or tools. Regularly check to see if handrails are loose or broken. Make sure that both sides of any steps have handrails. Any raised decks and porches should have guardrails on the edges. Have any leaves, snow, or ice cleared regularly. Use sand or salt on walking paths during winter. Clean up any spills in your garage right away. This  includes oil or grease spills. What can I do in the bathroom? Use night lights. Install grab bars by the toilet and in the tub and shower. Do not use towel bars as grab bars. Use non-skid mats or decals in the tub or shower. If you need to sit down in the shower, use a plastic, non-slip stool. Keep the floor dry. Clean up any water that spills on the floor as soon as it happens. Remove soap buildup in the tub or shower regularly. Attach bath mats securely with double-sided non-slip rug tape. Do not have throw rugs and other things on the floor that can make you trip. What can I do in the bedroom? Use night lights. Make sure that you have a light by your bed that is easy to reach. Do not use any sheets or blankets that are too big for your bed. They should not hang down onto the floor. Have a firm chair that has side arms. You can use this for support while you get dressed. Do not have throw rugs and other things on  the floor that can make you trip. What can I do in the kitchen? Clean up any spills right away. Avoid walking on wet floors. Keep items that you use a lot in easy-to-reach places. If you need to reach something above you, use a strong step stool that has a grab bar. Keep electrical cords out of the way. Do not use floor polish or wax that makes floors slippery. If you must use wax, use non-skid floor wax. Do not have throw rugs and other things on the floor that can make you trip. What can I do with my stairs? Do not leave any items on the stairs. Make sure that there are handrails on both sides of the stairs and use them. Fix handrails that are broken or loose. Make sure that handrails are as long as the stairways. Check any carpeting to make sure that it is firmly attached to the stairs. Fix any carpet that is loose or worn. Avoid having throw rugs at the top or bottom of the stairs. If you do have throw rugs, attach them to the floor with carpet tape. Make sure that you  have a light switch at the top of the stairs and the bottom of the stairs. If you do not have them, ask someone to add them for you. What else can I do to help prevent falls? Wear shoes that: Do not have high heels. Have rubber bottoms. Are comfortable and fit you well. Are closed at the toe. Do not wear sandals. If you use a stepladder: Make sure that it is fully opened. Do not climb a closed stepladder. Make sure that both sides of the stepladder are locked into place. Ask someone to hold it for you, if possible. Clearly mark and make sure that you can see: Any grab bars or handrails. First and last steps. Where the edge of each step is. Use tools that help you move around (mobility aids) if they are needed. These include: Canes. Walkers. Scooters. Crutches. Turn on the lights when you go into a dark area. Replace any light bulbs as soon as they burn out. Set up your furniture so you have a clear path. Avoid moving your furniture around. If any of your floors are uneven, fix them. If there are any pets around you, be aware of where they are. Review your medicines with your doctor. Some medicines can make you feel dizzy. This can increase your chance of falling. Ask your doctor what other things that you can do to help prevent falls. This information is not intended to replace advice given to you by your health care provider. Make sure you discuss any questions you have with your health care provider. Document Released: 05/29/2009 Document Revised: 01/08/2016 Document Reviewed: 09/06/2014 Elsevier Interactive Patient Education  2017 [employer].

## 2022-10-15 ENCOUNTER — Telehealth: Payer: Self-pay | Admitting: Family Medicine

## 2022-10-15 DIAGNOSIS — I83812 Varicose veins of left lower extremities with pain: Secondary | ICD-10-CM | POA: Diagnosis not present

## 2022-10-15 DIAGNOSIS — I872 Venous insufficiency (chronic) (peripheral): Secondary | ICD-10-CM | POA: Diagnosis not present

## 2022-10-15 DIAGNOSIS — L819 Disorder of pigmentation, unspecified: Secondary | ICD-10-CM | POA: Diagnosis not present

## 2022-10-15 DIAGNOSIS — I2510 Atherosclerotic heart disease of native coronary artery without angina pectoris: Secondary | ICD-10-CM | POA: Diagnosis not present

## 2022-10-15 DIAGNOSIS — R600 Localized edema: Secondary | ICD-10-CM | POA: Diagnosis not present

## 2022-10-15 DIAGNOSIS — M7989 Other specified soft tissue disorders: Secondary | ICD-10-CM | POA: Diagnosis not present

## 2022-10-15 NOTE — Telephone Encounter (Signed)
Patient would like his CPE labs done at [facility] prior to his physical  CPE scheduled: 7.22.24

## 2022-10-18 NOTE — Telephone Encounter (Signed)
Am I right that I can just order these as future labs like normal?

## 2022-10-19 ENCOUNTER — Other Ambulatory Visit: Payer: Self-pay | Admitting: Family Medicine

## 2022-10-19 DIAGNOSIS — R7309 Other abnormal glucose: Secondary | ICD-10-CM

## 2022-10-19 DIAGNOSIS — E785 Hyperlipidemia, unspecified: Secondary | ICD-10-CM

## 2022-10-19 DIAGNOSIS — Z79899 Other long term (current) drug therapy: Secondary | ICD-10-CM

## 2022-10-19 DIAGNOSIS — Z125 Encounter for screening for malignant neoplasm of prostate: Secondary | ICD-10-CM

## 2022-10-19 NOTE — Telephone Encounter (Signed)
Phillip [name] notified as instructed by telephone.

## 2022-10-19 NOTE — Telephone Encounter (Signed)
I have ordered all of his labs, so he just needs to schedule a lab appointment/draw at [facility] a week or so before his appointment with me.

## 2022-11-04 DIAGNOSIS — R69 Illness, unspecified: Secondary | ICD-10-CM | POA: Diagnosis not present

## 2022-11-09 DIAGNOSIS — H43393 Other vitreous opacities, bilateral: Secondary | ICD-10-CM | POA: Diagnosis not present

## 2022-11-15 DIAGNOSIS — R69 Illness, unspecified: Secondary | ICD-10-CM | POA: Diagnosis not present

## 2022-12-10 DIAGNOSIS — Z85828 Personal history of other malignant neoplasm of skin: Secondary | ICD-10-CM | POA: Diagnosis not present

## 2022-12-10 DIAGNOSIS — L821 Other seborrheic keratosis: Secondary | ICD-10-CM | POA: Diagnosis not present

## 2022-12-10 DIAGNOSIS — L570 Actinic keratosis: Secondary | ICD-10-CM | POA: Diagnosis not present

## 2022-12-10 DIAGNOSIS — Z08 Encounter for follow-up examination after completed treatment for malignant neoplasm: Secondary | ICD-10-CM | POA: Diagnosis not present

## 2022-12-10 DIAGNOSIS — D225 Melanocytic nevi of trunk: Secondary | ICD-10-CM | POA: Diagnosis not present

## 2022-12-13 DIAGNOSIS — R69 Illness, unspecified: Secondary | ICD-10-CM | POA: Diagnosis not present

## 2023-01-13 DIAGNOSIS — I872 Venous insufficiency (chronic) (peripheral): Secondary | ICD-10-CM | POA: Diagnosis not present

## 2023-01-25 DIAGNOSIS — R69 Illness, unspecified: Secondary | ICD-10-CM | POA: Diagnosis not present

## 2023-01-27 DIAGNOSIS — R69 Illness, unspecified: Secondary | ICD-10-CM | POA: Diagnosis not present

## 2023-02-22 DIAGNOSIS — R69 Illness, unspecified: Secondary | ICD-10-CM | POA: Diagnosis not present

## 2023-02-24 DIAGNOSIS — R69 Illness, unspecified: Secondary | ICD-10-CM | POA: Diagnosis not present

## 2023-03-06 NOTE — Progress Notes (Unsigned)
Clariza Sickman T. Hula Tasso, MD, CAQ Sports Medicine [facility] [address] [place], [address]  Phone: [phone]  FAX: [phone]  Kishon Garriga - 72 y.o. male  MRN [id number]  Date of Birth: Sep 28, 1950  Date: 03/09/2023  PCP: Hannah Beat, MD  Referral: Hannah Beat, MD  No chief complaint on file.  Patient Care Team: Hannah Beat, MD as PCP - General End, Cristal Deer, MD as PCP - Cardiology (Cardiology) Subjective:   Yoandri Congrove is a 72 y.o. pleasant patient who presents with the following:  Preventative Health Maintenance Visit:  Health Maintenance Summary Reviewed and updated, unless pt declines services.  Tobacco History Reviewed. Alcohol: No concerns, no excessive use Exercise Habits: Some activity, rec at least 30 mins 5 times a week STD concerns: no risk or activity to increase risk Drug Use: None  Loraine Leriche is a well-known patient who does have some coronary disease.  He also has hyperlipidemia.  Lipids: Doing well, stable. Tolerating meds fine with no SE. Panel reviewed with patient.  Lipids: Lab Results  Component Value Date   CHOL 73 01/25/2022   Lab Results  Component Value Date   HDL 27.00 (L) 01/25/2022   Lab Results  Component Value Date   LDLCALC 31 01/25/2022   Lab Results  Component Value Date   TRIG 73.0 01/25/2022   Lab Results  Component Value Date   CHOLHDL 3 01/25/2022    Lab Results  Component Value Date   ALT 14 01/25/2022   AST 17 01/25/2022   ALKPHOS 98 01/25/2022   BILITOT 0.7 01/25/2022      Health Maintenance  Topic Date Due   COVID-19 Vaccine (6 - 2023-24 season) 07/09/2022   Colonoscopy  05/25/2023   INFLUENZA VACCINE  03/17/2023   Medicare Annual Wellness (AWV)  09/09/2023   DTaP/Tdap/Td (3 - Td or Tdap) 10/17/2030   Pneumonia Vaccine 2+ Years old  Completed   Hepatitis C Screening  Completed   Zoster Vaccines- Shingrix  Completed   HPV VACCINES   Aged Out   Immunization History  Administered Date(s) Administered   Fluad Quad(high Dose 65+) 05/09/2019   H1N1 09/26/2008   Influenza Whole 09/26/2008   Influenza, High Dose Seasonal PF 05/31/2017, 06/14/2018, 06/04/2021, 05/14/2022   Influenza,inj,Quad PF,6+ Mos 05/30/2014, 06/07/2015, 05/10/2016   Influenza-Unspecified 05/16/2020   Moderna Covid-19 Vaccine Bivalent Booster 73yrs & up 06/04/2021   PFIZER Comirnaty(Gray Top)Covid-19 Tri-Sucrose Vaccine 05/14/2022   PFIZER(Purple Top)SARS-COV-2 Vaccination 08/31/2019, 09/21/2019, 08/05/2020   Pneumococcal Conjugate-13 07/22/2016   Pneumococcal Polysaccharide-23 08/17/2007, 11/18/2017   Respiratory Syncytial Virus Vaccine,Recomb Aduvanted(Arexvy) 07/31/2022   Td 12/16/2008   Tdap 10/16/2020   Zoster Recombinant(Shingrix) 02/06/2022, 07/31/2022   Patient Active Problem List   Diagnosis Date Noted   Hyperlipidemia LDL goal <70 07/20/2017    Priority: Medium    Coronary artery disease involving native coronary artery of native heart without angina pectoris 11/02/2016    Priority: Medium    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 06/2016 Echo: EF 55-60%, no rwma, mild AI, triv MR/TR/PR, lipomatous hypertrophy of atrial septum;  b. 06/2016 Ex MV: EF 56%, large, partially reversible anteroseptal, inferoseptal, inferior,and apical perfusion defect - borderline for ischemia (TID 1.64) - equivocal study;  c. 06/2016 PCI: LM nl, LAD 25p/38m (3.0x24 Promus Premier MR DES),  LCX min irregs, RCA nl.   Colon polyps    a. Severe dysplasia in a right-sided polyp removed surgically in [place], 2006--> final pathology showed this was not a cancer.  Follow up colonoscopy at one year interval was normal.   Erectile dysfunction    GERD (gastroesophageal reflux disease)    H/o Barrett's esophagus, orginally diagnosed in 2006, biopsies showed no dysplasia; follow up EGD also showed no dysplasia   History of BPH    Hyperlipidemia LDL goal <70 07/20/2017   Lyme disease    x2    Past Surgical History:  Procedure Laterality Date   CARDIAC CATHETERIZATION N/A 07/14/2016   Procedure: Left Heart Cath and Coronary Angiography;  Surgeon: Tonny Bollman, MD;  Location: [facility] INVASIVE CV LAB;  Service: Cardiovascular;  Laterality: N/A;   CARDIAC CATHETERIZATION N/A 07/14/2016   Procedure: Coronary Stent Intervention;  Surgeon: Tonny Bollman, MD;  Location: [facility] INVASIVE CV LAB;  Service: Cardiovascular;  Laterality: N/A;   COLONOSCOPY  12/14/2008   Repeat 5 years   ESOPHAGOGASTRODUODENOSCOPY  12/14/2008   Barrett's, repeat 3 years   HEMICOLECTOMY  08/16/2004   Right, for severely dysplastic right colon polyp   HERNIA REPAIR  08/16/2006   UMBILICAL   LUMBAR DISC SURGERY  08/16/1990   L4-5   TONSILLECTOMY     As a child   TUMOR REMOVAL  08/16/2000   Fatty tumor (neck)   UPPER GASTROINTESTINAL ENDOSCOPY     VARICOSE VEIN SURGERY  08/17/1995   Removal, right   VEIN SURGERY  2022    Family History  Problem Relation Age of Onset   Hypertension Father    Diabetes Father    Heart disease Father    Lymphoma Mother    Alcohol abuse Paternal Grandfather     Social History   Social History Narrative   Lives in [place] with his wife.  Ambulance person.    Past Medical History, Surgical History, Social History, Family History, Problem List, Medications, and Allergies have been reviewed and updated if relevant.  Review of Systems: Pertinent positives  are listed above.  Otherwise, a full 14 point review of systems has been done in full and it is negative except where it is noted positive.  Objective:   There were no vitals taken for this visit. Ideal Body Weight:    Ideal Body Weight:   No results found.    09/08/2022    2:01 PM 09/07/2021    2:03 PM 09/05/2020    2:11 PM 05/02/2019    2:05 PM 11/18/2017    8:14 AM  Depression screen PHQ 2/9  Decreased Interest 0 0 0 0 0  Down, Depressed, Hopeless 0 0 0 0 0  PHQ - 2 Score 0 0 0 0 0  Altered sleeping   0 0 0  Tired, decreased energy   0 0 0  Change in appetite   0 0 0  Feeling bad or failure about yourself    0 0 0  Trouble concentrating   0 0 0  Moving slowly or fidgety/restless   0 0 0  Suicidal thoughts  0 0 0  PHQ-9 Score   0 0 0  Difficult doing work/chores   Not difficult at all Not difficult at all Not difficult at all     GEN: well developed, well nourished, no acute distress Eyes: conjunctiva and lids normal, PERRLA, EOMI ENT: TM clear, nares clear, oral exam WNL Neck: supple, no lymphadenopathy, no thyromegaly, no JVD Pulm: clear to auscultation and percussion, respiratory effort normal CV: regular rate and rhythm, S1-S2, no murmur, rub or gallop, no bruits, peripheral pulses normal and symmetric, no cyanosis, clubbing, edema or varicosities GI: soft, non-tender; no hepatosplenomegaly, masses; active bowel sounds all quadrants GU: deferred Lymph: no cervical, axillary or inguinal adenopathy MSK: gait normal, muscle tone and strength WNL, no joint swelling, effusions, discoloration, crepitus  SKIN: clear, good turgor, color WNL, no rashes, lesions, or ulcerations Neuro: normal mental status, normal strength, sensation, and motion Psych: alert; oriented to person, place and time, normally interactive and not anxious or depressed in appearance.  All labs reviewed with patient. Results for orders placed or performed during the hospital encounter of 07/21/22  TSH   Result Value Ref Range   TSH 3.840 0.350 - 4.500 uIU/mL    Assessment and Plan:     ICD-10-CM   1. Healthcare maintenance  Z00.00       Health Maintenance Exam: The patient's preventative maintenance and recommended screening tests for an annual wellness exam were reviewed in full today. Brought up to date unless services declined.  Counselled on the importance of diet, exercise, and its role in overall health and mortality. The patient's FH and SH was reviewed, including their home life, tobacco status, and drug and alcohol status.  Follow-up in 1 year for physical exam or additional follow-up below.  Disposition: No follow-ups on file.  No orders of the defined types were placed in this encounter.  There are no discontinued medications. No orders of the defined types were placed in this encounter.   Signed,  Elpidio Galea. Osamah Schmader, MD   Allergies as of 03/09/2023       Reactions   Penicillins    REACTION: Rash as a child - Tolerated AMOXICILLIN SEVERAL TIMES AS ADULT RECENTLY Has patient had a PCN reaction causing immediate rash, facial/tongue/throat swelling, SOB or lightheadedness with hypotension: YES Has patient had a PCN reaction causing severe rash involving mucus membranes or skin necrosis:NO Has patient had a PCN reaction that required hospitalization NO Has patient had a PCN reaction occurring within the last 10 years: NO If all of the above answers are "NO", then may proceed with         Medication List        Accurate as of March 06, 2023  1:03 PM. If you have any questions, ask your nurse or doctor.          aspirin EC 81 MG tablet Take 1 tablet (81 mg total) by mouth daily.   atorvastatin 10 MG tablet Commonly known as: LIPITOR TAKE 1 TABLET BY MOUTH EVERY DAY   cholecalciferol 10 MCG/ML Liqd oral liquid Commonly known as: VITAMIN D3 Take 400 Units by mouth daily.   CO Q-10 PO Take 1 tablet by mouth daily.   cyanocobalamin 500 MCG  tablet Commonly known as: VITAMIN B12 Take 1 tablet (500 mcg total) by mouth daily.   folic acid 800 MCG tablet Commonly known as: FOLVITE Take 400 mcg by mouth daily.   GLUCOSAMINE PO Take 1,500 mg by mouth daily.   Magnesium  250 MG Tabs Take by mouth.   nitroGLYCERIN 0.4 MG SL tablet Commonly known as: NITROSTAT PLACE 1 TABLET UNDER THE TONGUE EVERY 5 MINUTES AS NEEDED FOR CHEST PAIN. MAX 3 DOSES   omeprazole 20 MG tablet Commonly known as: PRILOSEC OTC Take 20 mg by mouth daily.   saw palmetto 80 MG capsule Take 80 mg by mouth daily.   VITAMIN C PO Take 1 tablet by mouth daily.   ZINC PO Take 1 tablet by mouth daily.

## 2023-03-07 ENCOUNTER — Other Ambulatory Visit: Payer: Self-pay

## 2023-03-07 ENCOUNTER — Other Ambulatory Visit (INDEPENDENT_AMBULATORY_CARE_PROVIDER_SITE_OTHER): Payer: Medicare HMO

## 2023-03-07 DIAGNOSIS — Z125 Encounter for screening for malignant neoplasm of prostate: Secondary | ICD-10-CM | POA: Diagnosis not present

## 2023-03-07 DIAGNOSIS — R7309 Other abnormal glucose: Secondary | ICD-10-CM

## 2023-03-07 DIAGNOSIS — E785 Hyperlipidemia, unspecified: Secondary | ICD-10-CM

## 2023-03-07 DIAGNOSIS — Z79899 Other long term (current) drug therapy: Secondary | ICD-10-CM | POA: Diagnosis not present

## 2023-03-07 LAB — LIPID PANEL
Cholesterol: 80 mg/dL (ref 0–200)
HDL: 31.4 mg/dL — ABNORMAL LOW (ref >39.00)
LDL Cholesterol: 36 mg/dL (ref 0–99)
NonHDL: 48.79
Total CHOL/HDL Ratio: 3
Triglycerides: 66 mg/dL (ref 0.0–149.0)
VLDL: 13.2 mg/dL (ref 0.0–40.0)

## 2023-03-07 LAB — BASIC METABOLIC PANEL
BUN: 16 mg/dL (ref 6–23)
CO2: 25 mEq/L (ref 19–32)
Calcium: 9.2 mg/dL (ref 8.4–10.5)
Chloride: 106 mEq/L (ref 96–112)
Creatinine, Ser: 1.1 mg/dL (ref 0.40–1.50)
GFR: 67.23 mL/min (ref >60.00)
Glucose, Bld: 105 mg/dL — ABNORMAL HIGH (ref 70–99)
Potassium: 4.3 mEq/L (ref 3.5–5.1)
Sodium: 142 mEq/L (ref 135–145)

## 2023-03-07 LAB — CBC WITH DIFFERENTIAL/PLATELET
Basophils Absolute: 0 10*3/uL (ref 0.0–0.1)
Basophils Relative: 1.1 % (ref 0.0–3.0)
Eosinophils Absolute: 0.1 10*3/uL (ref 0.0–0.7)
Eosinophils Relative: 1.4 % (ref 0.0–5.0)
HCT: 46.7 % (ref 39.0–52.0)
Hemoglobin: 15.4 g/dL (ref 13.0–17.0)
Lymphocytes Relative: 25.2 % (ref 12.0–46.0)
Lymphs Abs: 1 10*3/uL (ref 0.7–4.0)
MCHC: 32.9 g/dL (ref 30.0–36.0)
MCV: 95.6 fl (ref 78.0–100.0)
Monocytes Absolute: 0.3 10*3/uL (ref 0.1–1.0)
Monocytes Relative: 8.3 % (ref 3.0–12.0)
Neutro Abs: 2.6 10*3/uL (ref 1.4–7.7)
Neutrophils Relative %: 64 % (ref 43.0–77.0)
Platelets: 108 10*3/uL — ABNORMAL LOW (ref 150.0–400.0)
RBC: 4.89 Mil/uL (ref 4.22–5.81)
RDW: 15 % (ref 11.5–15.5)
WBC: 4 10*3/uL (ref 4.0–10.5)

## 2023-03-07 LAB — PSA, MEDICARE: PSA: 0.62 ng/ml (ref 0.10–4.00)

## 2023-03-07 LAB — HEPATIC FUNCTION PANEL
ALT: 20 U/L (ref 0–53)
AST: 23 U/L (ref 0–37)
Albumin: 4.3 g/dL (ref 3.5–5.2)
Alkaline Phosphatase: 98 U/L (ref 39–117)
Bilirubin, Direct: 0.2 mg/dL (ref 0.0–0.3)
Total Bilirubin: 0.7 mg/dL (ref 0.2–1.2)
Total Protein: 6.7 g/dL (ref 6.0–8.3)

## 2023-03-07 LAB — HEMOGLOBIN A1C: Hgb A1c MFr Bld: 5.4 % (ref 4.6–6.5)

## 2023-03-09 ENCOUNTER — Encounter: Payer: Self-pay | Admitting: Family Medicine

## 2023-03-09 ENCOUNTER — Ambulatory Visit (INDEPENDENT_AMBULATORY_CARE_PROVIDER_SITE_OTHER): Payer: Medicare HMO | Admitting: Family Medicine

## 2023-03-09 ENCOUNTER — Telehealth: Payer: Self-pay | Admitting: Family Medicine

## 2023-03-09 VITALS — BP 120/66 | HR 65 | Temp 97.3°F | Ht 72.25 in | Wt 209.5 lb

## 2023-03-09 DIAGNOSIS — Z0000 Encounter for general adult medical examination without abnormal findings: Secondary | ICD-10-CM

## 2023-03-09 NOTE — Telephone Encounter (Signed)
While patient was checking out and making cpe appointment for 2025, patient asked if he could have next year's lab orders sent to [facility] on [address] since is is closer to his home. Advised pt I would send this is a message to Dr. Patsy Lager. Patient's cpe is on 03/14/2024 and he will need to do labs on 03/07/2024 for them to be complete a week before.

## 2023-03-09 NOTE — Telephone Encounter (Signed)
No problem.  I do not think that I can order them this far out.

## 2023-03-18 DIAGNOSIS — R69 Illness, unspecified: Secondary | ICD-10-CM | POA: Diagnosis not present

## 2023-03-29 DIAGNOSIS — R69 Illness, unspecified: Secondary | ICD-10-CM | POA: Diagnosis not present

## 2023-04-01 ENCOUNTER — Other Ambulatory Visit: Payer: Self-pay | Admitting: Family Medicine

## 2023-04-26 DIAGNOSIS — R69 Illness, unspecified: Secondary | ICD-10-CM | POA: Diagnosis not present

## 2023-05-27 DIAGNOSIS — R69 Illness, unspecified: Secondary | ICD-10-CM | POA: Diagnosis not present

## 2023-05-30 DIAGNOSIS — R69 Illness, unspecified: Secondary | ICD-10-CM | POA: Diagnosis not present

## 2023-06-09 DIAGNOSIS — R69 Illness, unspecified: Secondary | ICD-10-CM | POA: Diagnosis not present

## 2023-06-27 DIAGNOSIS — I872 Venous insufficiency (chronic) (peripheral): Secondary | ICD-10-CM | POA: Diagnosis not present

## 2023-06-27 DIAGNOSIS — R600 Localized edema: Secondary | ICD-10-CM | POA: Diagnosis not present

## 2023-06-27 DIAGNOSIS — M79604 Pain in right leg: Secondary | ICD-10-CM | POA: Diagnosis not present

## 2023-06-27 DIAGNOSIS — I87393 Chronic venous hypertension (idiopathic) with other complications of bilateral lower extremity: Secondary | ICD-10-CM | POA: Diagnosis not present

## 2023-06-27 DIAGNOSIS — L819 Disorder of pigmentation, unspecified: Secondary | ICD-10-CM | POA: Diagnosis not present

## 2023-06-28 ENCOUNTER — Encounter: Payer: Self-pay | Admitting: Family Medicine

## 2023-06-28 ENCOUNTER — Encounter: Payer: Self-pay | Admitting: Gastroenterology

## 2023-07-01 DIAGNOSIS — R69 Illness, unspecified: Secondary | ICD-10-CM | POA: Diagnosis not present

## 2023-07-22 ENCOUNTER — Encounter: Payer: Self-pay | Admitting: Gastroenterology

## 2023-07-25 DIAGNOSIS — I83892 Varicose veins of left lower extremities with other complications: Secondary | ICD-10-CM | POA: Diagnosis not present

## 2023-08-08 DIAGNOSIS — I87391 Chronic venous hypertension (idiopathic) with other complications of right lower extremity: Secondary | ICD-10-CM | POA: Diagnosis not present

## 2023-08-11 DIAGNOSIS — R69 Illness, unspecified: Secondary | ICD-10-CM | POA: Diagnosis not present

## 2023-08-15 DIAGNOSIS — I87392 Chronic venous hypertension (idiopathic) with other complications of left lower extremity: Secondary | ICD-10-CM | POA: Diagnosis not present

## 2023-08-16 DIAGNOSIS — R69 Illness, unspecified: Secondary | ICD-10-CM | POA: Diagnosis not present

## 2023-08-22 ENCOUNTER — Ambulatory Visit (AMBULATORY_SURGERY_CENTER): Payer: Medicare HMO

## 2023-08-22 VITALS — Ht 73.0 in | Wt 206.0 lb

## 2023-08-22 DIAGNOSIS — Z860100 Personal history of colon polyps, unspecified: Secondary | ICD-10-CM

## 2023-08-22 DIAGNOSIS — K2270 Barrett's esophagus without dysplasia: Secondary | ICD-10-CM

## 2023-08-22 MED ORDER — NA SULFATE-K SULFATE-MG SULF 17.5-3.13-1.6 GM/177ML PO SOLN
1.0000 | Freq: Once | ORAL | 0 refills | Status: AC
Start: 2023-08-22 — End: 2023-08-22

## 2023-08-22 NOTE — Progress Notes (Signed)
No egg or soy allergy known to patient  No issues known to pt with past sedation with any surgeries or procedures Patient denies ever being told they had issues or difficulty with intubation  No FH of Malignant Hyperthermia Pt is not on diet pills Pt is not on  home 02  Pt is not on blood thinners  Pt denies issues with constipation  No A fib or A flutter Have any cardiac testing pending--no Ambulates independently Patient's chart reviewed by Cathlyn Parsons CNRA prior to previsit and patient appropriate for the [facility].  Previsit completed and red dot placed by patient's name on their procedure day (on provider's schedule).    Pt instructed to use [email or web address] or GoodRx for a price reduction on prep

## 2023-09-01 ENCOUNTER — Telehealth: Payer: Self-pay | Admitting: Gastroenterology

## 2023-09-01 NOTE — Telephone Encounter (Signed)
Inbound call from patient stating he is scheduled to have a double procedure next week and is wanting to know if he can have pickle cubes or relish. Please advise.

## 2023-09-01 NOTE — Telephone Encounter (Signed)
Spoke with patient advise to hold until after his procedure

## 2023-09-02 ENCOUNTER — Encounter: Payer: Self-pay | Admitting: Gastroenterology

## 2023-09-06 ENCOUNTER — Encounter: Payer: Medicare HMO | Admitting: Gastroenterology

## 2023-09-12 ENCOUNTER — Ambulatory Visit (INDEPENDENT_AMBULATORY_CARE_PROVIDER_SITE_OTHER): Payer: Medicare HMO

## 2023-09-12 VITALS — Ht 72.25 in | Wt 206.0 lb

## 2023-09-12 DIAGNOSIS — Z0000 Encounter for general adult medical examination without abnormal findings: Secondary | ICD-10-CM

## 2023-09-12 NOTE — Progress Notes (Signed)
Subjective:   Phillip [name] is a 73 y.o. male who presents for Medicare Annual/Subsequent preventive examination.  Visit Complete: Virtual I connected with  Phillip [name] on 09/12/23 by a audio enabled telemedicine application and verified that I am speaking with the correct person using two identifiers.  Patient Location: Home  Provider Location: Home Office  I discussed the limitations of evaluation and management by telemedicine. The patient expressed understanding and agreed to proceed.  Vital Signs: Because this visit was a virtual/telehealth visit, some criteria may be missing or patient reported. Any vitals not documented were not able to be obtained and vitals that have been documented are patient reported.  Patient Medicare AWV questionnaire was completed by the patient on (not done); I have confirmed that all information answered by patient is correct and no changes since this date.  Cardiac Risk Factors include: advanced age (>24men, >20 women);dyslipidemia;male gender    Objective:    Today's Vitals   09/12/23 1101 09/12/23 1102  Weight: 206 lb (93.4 kg)   Height: 6' 0.25" (1.835 m)   PainSc: 4  4    Body mass index is 27.75 kg/m.     09/12/2023   11:11 AM 09/08/2022    2:03 PM 09/07/2021    2:00 PM 05/02/2019    2:04 PM 11/18/2017    8:15 AM 08/02/2016    2:21 PM 07/13/2016    6:00 AM  Advanced Directives  Does Patient Have a Medical Advance Directive? No No No No No No No  Would patient like information on creating a medical advance directive?  No - Patient declined Yes (MAU/Ambulatory/Procedural Areas - Information given) No - Patient declined Yes (MAU/Ambulatory/Procedural Areas - Information given) Yes (MAU/Ambulatory/Procedural Areas - Information given) No - Patient declined    Current Medications (verified) Outpatient Encounter Medications as of 09/12/2023  Medication Sig   Ascorbic Acid (VITAMIN C PO) Take 1,000 mg by mouth daily.   aspirin EC 81  MG tablet Take 1 tablet (81 mg total) by mouth daily.   atorvastatin (LIPITOR) 10 MG tablet TAKE 1 TABLET BY MOUTH EVERY DAY   cholecalciferol (VITAMIN D3) 10 MCG/ML LIQD oral liquid Take 400 Units by mouth daily.   Coenzyme Q10 (CO Q-10 PO) Take 1 tablet by mouth daily.   Glucosamine HCl (GLUCOSAMINE PO) Take 1,500 mg by mouth daily.   Magnesium 250 MG TABS Take by mouth.   Multiple Vitamins-Minerals (ZINC PO) Take 1 tablet by mouth daily.   naproxen sodium (ALEVE) 220 MG tablet Take 220 mg by mouth as needed.   niacin (TRUE VITAMIN B3) 500 MG tablet Take 500 mg by mouth at bedtime.   nitroGLYCERIN (NITROSTAT) 0.4 MG SL tablet PLACE 1 TABLET UNDER THE TONGUE EVERY 5 MINUTES AS NEEDED FOR CHEST PAIN. MAX 3 DOSES   Nutritional Supplements (VARICOSE VEINS FORMULA PO) Take 2 tablets by mouth daily.   omeprazole (PRILOSEC OTC) 20 MG tablet Take 20 mg by mouth every other day.   omeprazole (PRILOSEC) 40 MG capsule Take 40 mg by mouth every other day.   saw palmetto 80 MG capsule Take 80 mg by mouth daily.   vitamin B-12 500 MCG tablet Take 1 tablet (500 mcg total) by mouth daily.   No facility-administered encounter medications on file as of 09/12/2023.    Allergies (verified) Penicillins   History: Past Medical History:  Diagnosis Date   Allergic rhinitis    BARRETTS ESOPHAGUS 12/16/2008   Qualifier: Diagnosis of  By: Ernest Mallick  LPN, Rena     CAD (coronary artery disease)    a. 06/2016 Echo: EF 55-60%, no rwma, mild AI, triv MR/TR/PR, lipomatous hypertrophy of atrial septum;  b. 06/2016 Ex MV: EF 56%, large, partially reversible anteroseptal, inferoseptal, inferior,and apical perfusion defect - borderline for ischemia (TID 1.64) - equivocal study;  c. 06/2016 PCI: LM nl, LAD 25p/81m (3.0x24 Promus Premier MR DES), LCX min irregs, RCA nl.   Colon polyps    a. Severe dysplasia in a right-sided polyp removed surgically in [place], 2006--> final pathology showed this was not a cancer.  Follow up  colonoscopy at one year interval was normal.   Erectile dysfunction    GERD (gastroesophageal reflux disease)    H/o Barrett's esophagus, orginally diagnosed in 2006, biopsies showed no dysplasia; follow up EGD also showed no dysplasia   History of BPH    Hyperlipidemia LDL goal <70 07/20/2017   Lyme disease    x2   Past Surgical History:  Procedure Laterality Date   CARDIAC CATHETERIZATION N/A 07/14/2016   Procedure: Left Heart Cath and Coronary Angiography;  Surgeon: Tonny Bollman, MD;  Location: [facility] INVASIVE CV LAB;  Service: Cardiovascular;  Laterality: N/A;   CARDIAC CATHETERIZATION N/A 07/14/2016   Procedure: Coronary Stent Intervention;  Surgeon: Tonny Bollman, MD;  Location: [facility] INVASIVE CV LAB;  Service: Cardiovascular;  Laterality: N/A;   COLONOSCOPY  12/14/2008   Repeat 5 years   ESOPHAGOGASTRODUODENOSCOPY  12/14/2008   Barrett's, repeat 3 years   HEMICOLECTOMY  08/16/2004   Right, for severely dysplastic right colon polyp   HERNIA REPAIR  08/16/2006   UMBILICAL   LUMBAR DISC SURGERY  08/16/1990   L4-5   TONSILLECTOMY     As a child   TUMOR REMOVAL  08/16/2000   Fatty tumor (neck)   UPPER GASTROINTESTINAL ENDOSCOPY     VARICOSE VEIN SURGERY  08/17/1995   Removal, right   VEIN SURGERY  2022   Family History  Problem Relation Age of Onset   Lymphoma Mother    Hypertension Father    Diabetes Father    Heart disease Father    Alcohol abuse Paternal Grandfather    Colon cancer Neg Hx    Esophageal cancer Neg Hx    Rectal cancer Neg Hx    Stomach cancer Neg Hx    Colon polyps Neg Hx    Social History   Socioeconomic History   Marital status: Married    Spouse name: Not on file   Number of children: 3   Years of education: Not on file   Highest education level: Not on file  Occupational History   Occupation: Education officer, environmental, Designer, television/film set    Employer: [employer]  Tobacco Use   Smoking status: Never   Smokeless tobacco: Never  Vaping Use   Vaping  status: Never Used  Substance and Sexual Activity   Alcohol use: No   Drug use: No   Sexual activity: Not on file  Other Topics Concern   Not on file  Social History Narrative   Lives in [place] with his wife.  Ambulance person.   Social Drivers of Corporate investment banker Strain: Low Risk  (09/12/2023)   Overall Financial Resource Strain (CARDIA)    Difficulty of Paying Living Expenses: Not hard at all  Food Insecurity: No Food Insecurity (09/12/2023)   Hunger Vital Sign    Worried About Running Out of Food in the Last Year: Never true    Ran  Out of Food in the Last Year: Never true  Transportation Needs: No Transportation Needs (09/12/2023)   PRAPARE - Administrator, Civil Service (Medical): No    Lack of Transportation (Non-Medical): No  Physical Activity: Sufficiently Active (09/12/2023)   Exercise Vital Sign    Days of Exercise per Week: 3 days    Minutes of Exercise per Session: 100 min  Stress: No Stress Concern Present (09/12/2023)   [employer] of Occupational Health - Occupational Stress Questionnaire    Feeling of Stress : Not at all  Social Connections: Socially Integrated (09/12/2023)   Social Connection and Isolation Panel [NHANES]    Frequency of Communication with Friends and Family: More than three times a week    Frequency of Social Gatherings with Friends and Family: More than three times a week    Attends Religious Services: More than 4 times per year    Active Member of [employer] or Organizations: Yes    Attends Engineer, structural: More than 4 times per year    Marital Status: Married    Tobacco Counseling Counseling given: Not Answered   Clinical Intake:  Pre-visit preparation completed: Yes  Pain : 0-10 Pain Score: 4  Pain Type: Chronic pain Pain Location: Back Pain Orientation: Lower Pain Descriptors / Indicators: Aching Pain Onset: More than a month ago Pain Frequency: Intermittent Pain Relieving  Factors: ice/heat/Naprosyn  Pain Relieving Factors: ice/heat/Naprosyn  BMI - recorded: 27.75 Nutritional Status: BMI 25 -29 Overweight Nutritional Risks: None Diabetes: No  How often do you need to have someone help you when you read instructions, pamphlets, or other written materials from your doctor or pharmacy?: 1 - Never  Interpreter Needed?: No  Comments: lives with wife Information entered by :: B.Kalei Mckillop,LPN  Activities of Daily Living    09/12/2023   11:11 AM  In your present state of health, do you have any difficulty performing the following activities:  Hearing? 0  Vision? 0  Difficulty concentrating or making decisions? 0  Walking or climbing stairs? 0  Dressing or bathing? 0  Doing errands, shopping? 0  Preparing Food and eating ? N  Using the Toilet? N  In the past six months, have you accidently leaked urine? N  Do you have problems with loss of bowel control? N  Managing your Medications? N  Managing your Finances? N    Patient Care Team: Hannah Beat, MD as PCP - General End, Cristal Deer, MD as PCP - Cardiology (Cardiology)  Indicate any recent Medical Services you may have received from other than [facility] providers in the past year (date may be approximate).     Assessment:   This is a routine wellness examination for Phillip [name].  Hearing/Vision screen No results found.   Goals Addressed             This Visit's Progress    COMPLETED: DIET - EAT MORE FRUITS AND VEGETABLES       COMPLETED: Increase physical activity   Not on track    Starting 11/18/2017, I will continue to exercise at gym for 90 minutes once weekly and to remain a Engineer, water.      Patient Stated   Not on track    Would like to increase exercise routine and drink more water       Depression Screen    09/12/2023   11:07 AM 09/08/2022    2:01 PM 09/07/2021    2:03 PM 09/05/2020    2:11 PM 05/02/2019  2:05 PM 11/18/2017    8:14 AM 08/02/2016    2:07 PM  PHQ 2/9  Scores  PHQ - 2 Score 0 0 0 0 0 0 0  PHQ- 9 Score    0 0 0 1    Fall Risk    09/12/2023   11:04 AM 09/08/2022    2:03 PM 09/07/2022    4:47 PM 09/07/2021    2:01 PM 09/05/2020    2:10 PM  Fall Risk   Falls in the past year? 0 1 1 0 0  Number falls in past yr: 0 0 0 0 0  Injury with Fall? 0 0 0 0 0  Risk for fall due to : No Fall Risks No Fall Risks   No Fall Risks  Follow up Education provided;Falls prevention discussed Falls prevention discussed;Falls evaluation completed  Falls prevention discussed Falls evaluation completed;Falls prevention discussed    MEDICARE RISK AT HOME: Medicare Risk at Home Any stairs in or around the home?: Yes If so, are there any without handrails?: Yes Home free of loose throw rugs in walkways, pet beds, electrical cords, etc?: Yes Adequate lighting in your home to reduce risk of falls?: Yes Life alert?: No Use of a cane, walker or w/c?: No Grab bars in the bathroom?: Yes Shower chair or bench in shower?: Yes Elevated toilet seat or a handicapped toilet?: Yes  TIMED UP AND GO:  Was the test performed?  No    Cognitive Function:    09/05/2020    2:12 PM 05/02/2019    2:08 PM 11/18/2017    8:20 AM  MMSE - Mini Mental State Exam  Not completed: Refused    Orientation to time  5 5  Orientation to Place  5 5  Registration  3 3  Attention/ Calculation  5 0  Recall  3 3  Language- name 2 objects   0  Language- repeat  1 1  Language- follow 3 step command   3  Language- read & follow direction   0  Write a sentence   0  Copy design   0  Total score   20        09/12/2023   11:12 AM 09/08/2022    2:06 PM  6CIT Screen  What Year? 0 points 0 points  What month? 0 points 0 points  What time? 0 points 0 points  Count back from 20 0 points 0 points  Months in reverse 0 points 0 points  Repeat phrase 0 points 0 points  Total Score 0 points 0 points    Immunizations Immunization History  Administered Date(s) Administered   Fluad Quad(high  Dose 65+) 05/09/2019   H1N1 09/26/2008   Influenza Whole 09/26/2008   Influenza, High Dose Seasonal PF 05/31/2017, 06/14/2018, 06/04/2021, 05/14/2022   Influenza,inj,Quad PF,6+ Mos 05/30/2014, 06/07/2015, 05/10/2016   Influenza-Unspecified 05/16/2020   Moderna Covid-19 Vaccine Bivalent Booster 34yrs & up 06/04/2021   PFIZER Comirnaty(Gray Top)Covid-19 Tri-Sucrose Vaccine 05/14/2022   PFIZER(Purple Top)SARS-COV-2 Vaccination 08/31/2019, 09/21/2019, 08/05/2020   Pneumococcal Conjugate-13 07/22/2016   Pneumococcal Polysaccharide-23 08/17/2007, 11/18/2017   Respiratory Syncytial Virus Vaccine,Recomb Aduvanted(Arexvy) 07/31/2022   Td 12/16/2008   Tdap 10/16/2020   Zoster Recombinant(Shingrix) 02/06/2022, 07/31/2022    TDAP status: Up to date  Flu Vaccine status: Up to date  Pneumococcal vaccine status: Up to date  Covid-19 vaccine status: Completed vaccines  Qualifies for Shingles Vaccine? Yes   Zostavax completed Yes   Shingrix Completed?: Yes  Screening Tests Health  Maintenance  Topic Date Due   INFLUENZA VACCINE  03/17/2023   COVID-19 Vaccine (6 - 2024-25 season) 04/17/2023   Colonoscopy  05/25/2023   Medicare Annual Wellness (AWV)  09/11/2024   DTaP/Tdap/Td (3 - Td or Tdap) 10/17/2030   Pneumonia Vaccine 49+ Years old  Completed   Hepatitis C Screening  Completed   Zoster Vaccines- Shingrix  Completed   HPV VACCINES  Aged Out    Health Maintenance  Health Maintenance Due  Topic Date Due   INFLUENZA VACCINE  03/17/2023   COVID-19 Vaccine (6 - 2024-25 season) 04/17/2023   Colonoscopy  05/25/2023    Colorectal cancer screening: Type of screening: Colonoscopy. Completed no. Repeat every 5-10 years SCHEDULED IN MARCH 2025  Lung Cancer Screening: (Low Dose CT Chest recommended if Age 51-80 years, 20 pack-year currently smoking OR have quit w/in 15years.) does not qualify.   Lung Cancer Screening Referral: no  Additional Screening:  Hepatitis C Screening: does not  qualify; Completed 11/18/2017  Vision Screening: Recommended annual ophthalmology exams for early detection of glaucoma and other disorders of the eye. Is the patient up to date with their annual eye exam?  Yes  Who is the provider or what is the name of the office in which the patient attends annual eye exams? Dr Seymour Bars If pt is not established with a provider, would they like to be referred to a provider to establish care? No .   Dental Screening: Recommended annual dental exams for proper oral hygiene  Diabetic Foot Exam: n/a  Community Resource Referral / Chronic Care Management: CRR required this visit?  No   CCM required this visit?  No     Plan:     I have personally reviewed and noted the following in the patient's chart:   Medical and social history Use of alcohol, tobacco or illicit drugs  Current medications and supplements including opioid prescriptions. Patient is not currently taking opioid prescriptions. Functional ability and status Nutritional status Physical activity Advanced directives List of other physicians Hospitalizations, surgeries, and ER visits in previous 12 months Vitals Screenings to include cognitive, depression, and falls Referrals and appointments  In addition, I have reviewed and discussed with patient certain preventive protocols, quality metrics, and best practice recommendations. A written personalized care plan for preventive services as well as general preventive health recommendations were provided to patient.    Sue Lush, LPN   1/61/0960   After Visit Summary: (MyChart) Due to this being a telephonic visit, the after visit summary with patients personalized plan was offered to patient via MyChart   Nurse Notes: The patient states he is doing well and has no concerns or questions at this time.

## 2023-09-12 NOTE — Patient Instructions (Signed)
Mr. Baty , Thank you for taking time to come for your Medicare Wellness Visit. I appreciate your ongoing commitment to your health goals. Please review the following plan we discussed and let me know if I can assist you in the future.   Referrals/Orders/Follow-Ups/Clinician Recommendations: none  This is a list of the screening recommended for you and due dates:  Health Maintenance  Topic Date Due   Flu Shot  03/17/2023   COVID-19 Vaccine (6 - 2024-25 season) 04/17/2023   Colon Cancer Screening  05/25/2023   Medicare Annual Wellness Visit  09/11/2024   DTaP/Tdap/Td vaccine (3 - Td or Tdap) 10/17/2030   Pneumonia Vaccine  Completed   Hepatitis C Screening  Completed   Zoster (Shingles) Vaccine  Completed   HPV Vaccine  Aged Out    Advanced directives: (Declined) Advance directive discussed with you today. Even though you declined this today, please call our office should you change your mind, and we can give you the proper paperwork for you to fill out.  Next Medicare Annual Wellness Visit scheduled for next year: Yes  10/12/2024 @ 10:50am televist

## 2023-09-15 ENCOUNTER — Encounter: Payer: Self-pay | Admitting: Family Medicine

## 2023-09-15 ENCOUNTER — Ambulatory Visit: Payer: Medicare HMO | Admitting: Family Medicine

## 2023-09-15 ENCOUNTER — Telehealth: Payer: Medicare HMO | Admitting: Family Medicine

## 2023-09-15 VITALS — Temp 98.6°F | Ht 72.25 in

## 2023-09-15 DIAGNOSIS — R051 Acute cough: Secondary | ICD-10-CM | POA: Diagnosis not present

## 2023-09-15 DIAGNOSIS — S39012A Strain of muscle, fascia and tendon of lower back, initial encounter: Secondary | ICD-10-CM | POA: Diagnosis not present

## 2023-09-15 MED ORDER — AZITHROMYCIN 250 MG PO TABS: ORAL_TABLET | ORAL | 0 refills | Status: DC

## 2023-09-15 MED ORDER — CYCLOBENZAPRINE HCL 10 MG PO TABS
5.0000 mg | ORAL_TABLET | Freq: Every evening | ORAL | 0 refills | Status: DC | PRN
Start: 2023-09-15 — End: 2023-10-13

## 2023-09-15 NOTE — Progress Notes (Signed)
Patient ID: Phillip [name], male    DOB: 1951-07-24, 73 y.o.   MRN: [id number]  This visit was conducted in person.  Temp 98.6 F (37 C) (Oral)   Ht 6' 0.25" (1.835 m)   BMI 27.75 kg/m    CC:  Chief Complaint  Patient presents with   Fever    Low Grade- Symptoms started Sunday Night Negative Covid Test on Tuesday   Sore Throat   Fatigue   Chest Congestion   Back Spasms    Requesting Muscle Relaxant    Subjective:   HPI: Phillip [name] is a 73 y.o. male patient of Dr. Cyndie Chime with history of coronary artery disease presenting on 09/15/2023 for Fever (Low Grade-/Symptoms started Sunday Night/Negative Covid Test on Tuesday), Sore Throat, Fatigue, Chest Congestion, and Back Spasms (Requesting Muscle Relaxant)   Date of onset: 5 days Initial symptoms included  nasal congestion Symptoms progressed to  chest congestion, ST, cough.  No SOB,  slight wheeze.  Mild left  ear pain, no face pain  Also having some back spasms off and on.. lifting more  No radiation of legs, no  new numbness or weakness.  Hx of L4-L5 surgery.    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 Non-smoker.      Relevant past medical, surgical, family and social history reviewed and updated as indicated. Interim medical history since our last visit reviewed. Allergies and medications reviewed and updated. Outpatient Medications Prior to Visit  Medication Sig Dispense Refill   Ascorbic Acid (VITAMIN C PO) Take 1,000 mg by mouth daily.     aspirin EC 81 MG tablet Take 1 tablet (81 mg total) by mouth daily. 90 tablet 3   atorvastatin (LIPITOR) 10 MG tablet TAKE 1 TABLET BY MOUTH EVERY DAY 90 tablet 3   cholecalciferol (VITAMIN D3) 10 MCG/ML LIQD oral liquid Take 400 Units by mouth daily.     Coenzyme Q10 (CO Q-10 PO) Take 1 tablet by mouth daily.     Glucosamine  HCl (GLUCOSAMINE PO) Take 1,500 mg by mouth daily.     Magnesium 250 MG TABS Take by mouth.     Multiple Vitamins-Minerals (ZINC PO) Take 1 tablet by mouth daily.     naproxen sodium (ALEVE) 220 MG tablet Take 220 mg by mouth as needed.     niacin (TRUE VITAMIN B3) 500 MG tablet Take 500 mg by mouth at bedtime.     nitroGLYCERIN (NITROSTAT) 0.4 MG SL tablet PLACE 1 TABLET UNDER THE TONGUE EVERY 5 MINUTES AS NEEDED FOR CHEST PAIN. MAX 3 DOSES 25 tablet 0   Nutritional Supplements (VARICOSE VEINS FORMULA PO) Take 2 tablets by mouth daily.     omeprazole (PRILOSEC OTC) 20 MG tablet Take 20 mg by mouth every other day.     omeprazole (PRILOSEC) 40 MG capsule Take 40 mg by mouth every other day.     saw palmetto 80 MG capsule Take 80 mg by mouth daily.     vitamin B-12 500 MCG tablet Take 1 tablet (500 mcg total) by mouth daily. 30 tablet 0   No facility-administered medications prior to visit.     Per HPI unless specifically indicated in ROS section below Review of Systems Objective:  Temp 98.6 F (37 C) (Oral)   Ht 6' 0.25" (1.835 m)  BMI 27.75 kg/m   Wt Readings from Last 3 Encounters:  09/12/23 206 lb (93.4 kg)  08/22/23 206 lb (93.4 kg)  03/09/23 209 lb 8 oz (95 kg)      Physical Exam    Results for orders placed or performed in visit on 03/07/23  PSA, Medicare   Collection Time: 03/07/23 11:38 AM  Result Value Ref Range   PSA 0.62 0.10 - 4.00 ng/ml  Lipid panel   Collection Time: 03/07/23 11:38 AM  Result Value Ref Range   Cholesterol 80 0 - 200 mg/dL   Triglycerides 13.2 0.0 - 149.0 mg/dL   HDL 44.01 (L) >02.72 mg/dL   VLDL 53.6 0.0 - 64.4 mg/dL   LDL Cholesterol 36 0 - 99 mg/dL   Total CHOL/HDL Ratio 3    NonHDL 48.79   Hemoglobin A1c   Collection Time: 03/07/23 11:38 AM  Result Value Ref Range   Hgb A1c MFr Bld 5.4 4.6 - 6.5 %  Hepatic function panel   Collection Time: 03/07/23 11:38 AM  Result Value Ref Range   Total Bilirubin 0.7 0.2 - 1.2 mg/dL    Bilirubin, Direct 0.2 0.0 - 0.3 mg/dL   Alkaline Phosphatase 98 39 - 117 U/L   AST 23 0 - 37 U/L   ALT 20 0 - 53 U/L   Total Protein 6.7 6.0 - 8.3 g/dL   Albumin 4.3 3.5 - 5.2 g/dL  CBC with Differential/Platelet   Collection Time: 03/07/23 11:38 AM  Result Value Ref Range   WBC 4.0 4.0 - 10.5 K/uL   RBC 4.89 4.22 - 5.81 Mil/uL   Hemoglobin 15.4 13.0 - 17.0 g/dL   HCT 03.4 74.2 - 59.5 %   MCV 95.6 78.0 - 100.0 fl   MCHC 32.9 30.0 - 36.0 g/dL   RDW 63.8 75.6 - 43.3 %   Platelets 108.0 (L) 150.0 - 400.0 K/uL   Neutrophils Relative % 64.0 43.0 - 77.0 %   Lymphocytes Relative 25.2 12.0 - 46.0 %   Monocytes Relative 8.3 3.0 - 12.0 %   Eosinophils Relative 1.4 0.0 - 5.0 %   Basophils Relative 1.1 0.0 - 3.0 %   Neutro Abs 2.6 1.4 - 7.7 K/uL   Lymphs Abs 1.0 0.7 - 4.0 K/uL   Monocytes Absolute 0.3 0.1 - 1.0 K/uL   Eosinophils Absolute 0.1 0.0 - 0.7 K/uL   Basophils Absolute 0.0 0.0 - 0.1 K/uL  Basic metabolic panel   Collection Time: 03/07/23 11:38 AM  Result Value Ref Range   Sodium 142 135 - 145 mEq/L   Potassium 4.3 3.5 - 5.1 mEq/L   Chloride 106 96 - 112 mEq/L   CO2 25 19 - 32 mEq/L   Glucose, Bld 105 (H) 70 - 99 mg/dL   BUN 16 6 - 23 mg/dL   Creatinine, Ser 2.95 0.40 - 1.50 mg/dL   GFR 18.84 >16.60 mL/min   Calcium 9.2 8.4 - 10.5 mg/dL    Assessment and Plan  Acute cough Assessment & Plan: Acute, symptoms most consistent with viral infection most likely flu.  I have encouraged him to do home COVID/flu test today.  We will treat with Tamiflu given he has increased risk for complications if his flu test is positive. Recommend symptomatic and supportive care.  If he is not improving as expected and flu test is negative I have sent a prescription for antibiotics for him to use after 2 to 3 days for possible bacterial bronchitis.   Strain of lumbar  paraspinous muscle, initial encounter Assessment & Plan: Acute, unable to examine patient given appointment is virtual.   Symptoms sound most likely consistent with lumbar muscle strain.  Will prescribe cyclobenzaprine 5 to 10 mg p.o. nightly as needed muscle spasm, continue naproxen as anti-inflammatory, use heat and start low back stretches.  He will make in person office visit if symptoms or not improving as expected.  Return and ER precautions provided.   Other orders -     Cyclobenzaprine HCl; Take 0.5-1 tablets (5-10 mg total) by mouth at bedtime as needed for muscle spasms.  Dispense: 15 tablet; Refill: 0 -     Azithromycin; 2 tab po x 1 day then 1 tab po daily  Dispense: 6 tablet; Refill: 0    No follow-ups on file.   Kerby Nora, MD

## 2023-09-15 NOTE — Assessment & Plan Note (Signed)
Acute, symptoms most consistent with viral infection most likely flu.  I have encouraged him to do home COVID/flu test today.  We will treat with Tamiflu given he has increased risk for complications if his flu test is positive. Recommend symptomatic and supportive care.  If he is not improving as expected and flu test is negative I have sent a prescription for antibiotics for him to use after 2 to 3 days for possible bacterial bronchitis.

## 2023-09-15 NOTE — Assessment & Plan Note (Signed)
Acute, unable to examine patient given appointment is virtual.  Symptoms sound most likely consistent with lumbar muscle strain.  Will prescribe cyclobenzaprine 5 to 10 mg p.o. nightly as needed muscle spasm, continue naproxen as anti-inflammatory, use heat and start low back stretches.  He will make in person office visit if symptoms or not improving as expected.  Return and ER precautions provided.

## 2023-09-16 ENCOUNTER — Telehealth: Payer: Self-pay | Admitting: Family Medicine

## 2023-09-16 MED ORDER — OSELTAMIVIR PHOSPHATE 75 MG PO CAPS: 75.0000 mg | ORAL_CAPSULE | Freq: Two times a day (BID) | ORAL | 0 refills | Status: DC

## 2023-09-16 NOTE — Telephone Encounter (Signed)
Treatment dosing of Tamiflu sent to pharmacy.  See wife's chart for prescription sent in for her.

## 2023-09-16 NOTE — Addendum Note (Signed)
Addended by: Kerby Nora E on: 09/16/2023 01:17 PM   Modules accepted: Orders

## 2023-09-16 NOTE — Telephone Encounter (Signed)
Spoke with pt and he states that he was calling and wanted to let Dr. Ermalene Searing know that he took an at home test and he was positive for Influenza A. He stated that she told him yesterday during his virtual visit that she would send in another medication if his test came back positive. He would like for that to be sent in. Pt would also like for his wife to have a preventative medication sent in for her take. A message has been created in her chart and sent to Dr. Ermalene Searing.

## 2023-09-16 NOTE — Telephone Encounter (Signed)
Patient is calling in to give the nurse his test results he would like a call back

## 2023-10-12 ENCOUNTER — Other Ambulatory Visit: Payer: Self-pay | Admitting: Family Medicine

## 2023-10-12 NOTE — Telephone Encounter (Signed)
 Last office visit 09/15/2023 with Dr. Ermalene Searing for acute cough and muscle spasms.  Last refilled 09/15/2023 for #15 with no refills.  Next Appt: CPE 03/14/2024.

## 2023-10-14 ENCOUNTER — Ambulatory Visit (AMBULATORY_SURGERY_CENTER): Payer: Medicare HMO

## 2023-10-14 ENCOUNTER — Telehealth: Payer: Self-pay

## 2023-10-14 VITALS — Ht 72.0 in | Wt 212.0 lb

## 2023-10-14 DIAGNOSIS — K2270 Barrett's esophagus without dysplasia: Secondary | ICD-10-CM

## 2023-10-14 DIAGNOSIS — Z860100 Personal history of colon polyps, unspecified: Secondary | ICD-10-CM

## 2023-10-14 NOTE — Telephone Encounter (Signed)
Reached patient and completed pre visit

## 2023-10-14 NOTE — Telephone Encounter (Signed)
 Called patient for pre visit but went to voicemail.  Left message that I was trying to reach him for his pre visit.

## 2023-10-14 NOTE — Progress Notes (Signed)

## 2023-10-17 ENCOUNTER — Other Ambulatory Visit: Payer: Self-pay | Admitting: Family Medicine

## 2023-10-17 DIAGNOSIS — I25118 Atherosclerotic heart disease of native coronary artery with other forms of angina pectoris: Secondary | ICD-10-CM

## 2023-10-26 ENCOUNTER — Encounter: Payer: Self-pay | Admitting: Gastroenterology

## 2023-10-31 ENCOUNTER — Telehealth: Payer: Self-pay | Admitting: Gastroenterology

## 2023-10-31 NOTE — Telephone Encounter (Signed)
 New Implant in mouth and was making sure if OK to have Endoscopy. RN informed him that it should not be any issue with procedure he is to have. No other questions at this time.

## 2023-10-31 NOTE — Telephone Encounter (Signed)
 Inbound call from patient stating he recently had a dental implant. Requesting a call to ensure that it will not effect upcoming procedure. Please advise, thank you.

## 2023-11-02 ENCOUNTER — Encounter: Payer: Self-pay | Admitting: Gastroenterology

## 2023-11-02 ENCOUNTER — Ambulatory Visit: Payer: Medicare HMO | Admitting: Gastroenterology

## 2023-11-02 VITALS — BP 114/60 | HR 47 | Temp 97.3°F | Resp 13 | Ht 72.0 in | Wt 208.0 lb

## 2023-11-02 DIAGNOSIS — K3189 Other diseases of stomach and duodenum: Secondary | ICD-10-CM

## 2023-11-02 DIAGNOSIS — Z860100 Personal history of colon polyps, unspecified: Secondary | ICD-10-CM

## 2023-11-02 DIAGNOSIS — Z980 Intestinal bypass and anastomosis status: Secondary | ICD-10-CM

## 2023-11-02 DIAGNOSIS — Z9889 Other specified postprocedural states: Secondary | ICD-10-CM

## 2023-11-02 DIAGNOSIS — Z13810 Encounter for screening for upper gastrointestinal disorder: Secondary | ICD-10-CM

## 2023-11-02 DIAGNOSIS — K2970 Gastritis, unspecified, without bleeding: Secondary | ICD-10-CM | POA: Diagnosis not present

## 2023-11-02 DIAGNOSIS — K641 Second degree hemorrhoids: Secondary | ICD-10-CM | POA: Diagnosis not present

## 2023-11-02 DIAGNOSIS — K449 Diaphragmatic hernia without obstruction or gangrene: Secondary | ICD-10-CM | POA: Diagnosis not present

## 2023-11-02 DIAGNOSIS — K562 Volvulus: Secondary | ICD-10-CM

## 2023-11-02 DIAGNOSIS — D127 Benign neoplasm of rectosigmoid junction: Secondary | ICD-10-CM | POA: Diagnosis not present

## 2023-11-02 DIAGNOSIS — Z1211 Encounter for screening for malignant neoplasm of colon: Secondary | ICD-10-CM

## 2023-11-02 DIAGNOSIS — Q439 Congenital malformation of intestine, unspecified: Secondary | ICD-10-CM

## 2023-11-02 DIAGNOSIS — K319 Disease of stomach and duodenum, unspecified: Secondary | ICD-10-CM | POA: Diagnosis not present

## 2023-11-02 DIAGNOSIS — Z860101 Personal history of adenomatous and serrated colon polyps: Secondary | ICD-10-CM

## 2023-11-02 DIAGNOSIS — K2270 Barrett's esophagus without dysplasia: Secondary | ICD-10-CM

## 2023-11-02 DIAGNOSIS — I2510 Atherosclerotic heart disease of native coronary artery without angina pectoris: Secondary | ICD-10-CM | POA: Diagnosis not present

## 2023-11-02 DIAGNOSIS — K635 Polyp of colon: Secondary | ICD-10-CM | POA: Diagnosis not present

## 2023-11-02 MED ORDER — SODIUM CHLORIDE 0.9 % IV SOLN: 500.0000 mL | Freq: Once | INTRAVENOUS | Status: DC

## 2023-11-02 NOTE — Patient Instructions (Addendum)
 - High fiber diet.                           - Use FiberCon 1-2 tablets PO daily.                           - Continue present medications.                           - Await pathology results.                           - 1 polyp removed and sent to pathology. Hemorrhoids.                             - Repeat colonoscopy in 5 years for surveillance,                            no matter pathology due to history of previous                            surgically resected advanced adenoma.                                      - Repeat upper endoscopy in likely 3-5 years for                            surveillance, as long as no evidence of dysplastic                            changes are noted.   YOU HAD AN ENDOSCOPIC PROCEDURE TODAY AT THE [facility]:   Refer to the procedure report that was given to you for any specific questions about what was found during the examination.  If the procedure report does not answer your questions, please call your gastroenterologist to clarify.  If you requested that your care partner not be given the details of your procedure findings, then the procedure report has been included in a sealed envelope for you to review at your convenience later.  YOU SHOULD EXPECT: Some feelings of bloating in the abdomen. Passage of more gas than usual.  Walking can help get rid of the air that was put into your GI tract during the procedure and reduce the bloating. If you had a lower endoscopy (such as a colonoscopy or flexible sigmoidoscopy) you may notice spotting of blood in your stool or on the toilet paper. If you underwent a bowel prep for your procedure, you may not have a normal bowel movement for a few days.  Please Note:  You might notice some irritation and congestion in your nose or some drainage.  This is from the oxygen used during your procedure.  There is no need for concern and it should clear up in a day or so.  SYMPTOMS TO  REPORT IMMEDIATELY:  Following lower endoscopy (colonoscopy or flexible sigmoidoscopy):  Excessive amounts of blood in the stool  Significant tenderness or worsening of abdominal pains  Swelling of the abdomen  that is new, acute  Fever of 100F or higher  Following upper endoscopy (EGD)  Vomiting of blood or coffee ground material  New chest pain or pain under the shoulder blades  Painful or persistently difficult swallowing  New shortness of breath  Fever of 100F or higher  Black, tarry-looking stools  For urgent or emergent issues, a gastroenterologist can be reached at any hour by calling [phone]. Do not use MyChart messaging for urgent concerns.    DIET:  We do recommend a small meal at first, but then you may proceed to your regular diet.  Drink plenty of fluids but you should avoid alcoholic beverages for 24 hours.  ACTIVITY:  You should plan to take it easy for the rest of today and you should NOT DRIVE or use heavy machinery until tomorrow (because of the sedation medicines used during the test).    FOLLOW UP: Our staff will call the number listed on your records the next business day following your procedure.  We will call around 7:15- 8:00 am to check on you and address any questions or concerns that you may have regarding the information given to you following your procedure. If we do not reach you, we will leave a message.     If any biopsies were taken you will be contacted by phone or by letter within the next 1-3 weeks.  Please call us at [phone] if you have not heard about the biopsies in 3 weeks.    SIGNATURES/CONFIDENTIALITY: You and/or your care partner have signed paperwork which will be entered into your electronic medical record.  These signatures attest to the fact that that the information above on your After Visit Summary has been reviewed and is understood.  Full responsibility of the confidentiality of this discharge information lies with you  and/or your care-partner.

## 2023-11-02 NOTE — Progress Notes (Signed)
 Drowsy, VSS, resps reg and even. Report to RN

## 2023-11-02 NOTE — Op Note (Signed)
 [facility] Patient Name: Phillip [name] Procedure Date: 11/02/2023 10:29 AM MRN: [id number] Endoscopist: Corliss Parish , MD, [id number] Age: 73 Referring MD:  Date of Birth: 1950-09-08 Gender: Male Account #: [id number] Procedure:                Upper GI endoscopy Indications:              Surveillance for malignancy due to personal history                            of Barrett's esophagus Medicines:                Monitored Anesthesia Care Procedure:                Pre-Anesthesia Assessment:                           - Prior to the procedure, a History and Physical                            was performed, and patient medications and                            allergies were reviewed. The patient's tolerance of                            previous anesthesia was also reviewed. The risks                            and benefits of the procedure and the sedation                            options and risks were discussed with the patient.                            All questions were answered, and informed consent                            was obtained. Prior Anticoagulants: The patient has                            taken no anticoagulant or antiplatelet agents                            except for aspirin. ASA Grade Assessment: III - A                            patient with severe systemic disease. After                            reviewing the risks and benefits, the patient was                            deemed in satisfactory condition to undergo the  procedure.                           After obtaining informed consent, the endoscope was                            passed under direct vision. Throughout the                            procedure, the patient's blood pressure, pulse, and                            oxygen saturations were monitored continuously. The                            Olympus Scope [id number] was introduced through the                             mouth, and advanced to the second part of duodenum.                            The upper GI endoscopy was accomplished without                            difficulty. The patient tolerated the procedure. Scope In: Scope Out: Findings:                 No gross lesions were noted in the proximal                            esophagus and in the mid esophagus.                           The esophagus and gastroesophageal junction were                            examined with white light and narrow band imaging                            (NBI) from a forward view and retroflexed position.                            There were esophageal mucosal changes consistent                            with short-segment Barrett's esophagus. These                            changes involved the mucosa along an irregular                            Z-line (40 cm from the incisors). Two tongues of                            salmon-colored mucosa  were present from 39 to 40 cm                            and scattered islands of salmon-colored mucosa were                            present from 39 to 40 cm. The maximum longitudinal                            extent of these esophageal mucosal changes was 1.5                            cm in length. Mucosa was biopsied with a cold                            forceps for histology in a targeted manner. One                            specimen bottle was sent to pathology.                           A 2 cm hiatal hernia was present.                           Patchy mildly erythematous mucosa without bleeding                            was found in the entire examined stomach. Biopsies                            were taken with a cold forceps for histology and                            Helicobacter pylori testing.                           No gross lesions were noted in the duodenal bulb,                            in the first portion of the  duodenum and in the                            second portion of the duodenum. Complications:            No immediate complications. Estimated Blood Loss:     Estimated blood loss was minimal. Impression:               - No gross lesions in the proximal esophagus and in                            the mid esophagus.                           - Esophageal mucosal changes consistent  with                            short-segment Barrett's esophagus noted in the                            distal esophagus. Biopsied.                           - 2 cm hiatal hernia.                           - Erythematous mucosa in the stomach. Biopsied.                           - No gross lesions in the duodenal bulb, in the                            first portion of the duodenum and in the second                            portion of the duodenum. Recommendation:           - Proceed to scheduled colonoscopy.                           - Continue present medications.                           - Await pathology results.                           - Repeat upper endoscopy in likely 3-5 years for                            surveillance, as long as no evidence of dysplastic                            changes are noted.                           - The findings and recommendations were discussed                            with the patient.                           - The findings and recommendations were discussed                            with the patient's family. Corliss Parish, MD 11/02/2023 11:05:54 AM

## 2023-11-02 NOTE — Progress Notes (Signed)
 GASTROENTEROLOGY PROCEDURE H&P NOTE   Primary Care Physician: Hannah Beat, MD  HPI: Phillip [name] is a 73 y.o. male who presents for colonoscopy for surveillance in setting of previous advanced adenoma status post hemicolectomy and other polyps and for EGD for history of Barrett's esophagus.  Past Medical History:  Diagnosis Date   Allergic rhinitis    Arthritis    BARRETTS ESOPHAGUS 12/16/2008   Qualifier: Diagnosis of  By: Ernest Mallick LPN, Rena     CAD (coronary artery disease)    a. 06/2016 Echo: EF 55-60%, no rwma, mild AI, triv MR/TR/PR, lipomatous hypertrophy of atrial septum;  b. 06/2016 Ex MV: EF 56%, large, partially reversible anteroseptal, inferoseptal, inferior,and apical perfusion defect - borderline for ischemia (TID 1.64) - equivocal study;  c. 06/2016 PCI: LM nl, LAD 25p/37m (3.0x24 Promus Premier MR DES), LCX min irregs, RCA nl.   Colon polyps    a. Severe dysplasia in a right-sided polyp removed surgically in [place], 2006--> final pathology showed this was not a cancer.  Follow up colonoscopy at one year interval was normal.   Erectile dysfunction    GERD (gastroesophageal reflux disease)    H/o Barrett's esophagus, orginally diagnosed in 2006, biopsies showed no dysplasia; follow up EGD also showed no dysplasia   History of BPH    Hyperlipidemia LDL goal <70 07/20/2017   Lyme disease    x2   Past Surgical History:  Procedure Laterality Date   CARDIAC CATHETERIZATION N/A 07/14/2016   Procedure: Left Heart Cath and Coronary Angiography;  Surgeon: Tonny Bollman, MD;  Location: [facility] INVASIVE CV LAB;  Service: Cardiovascular;  Laterality: N/A;   CARDIAC CATHETERIZATION N/A 07/14/2016   Procedure: Coronary Stent Intervention;  Surgeon: Tonny Bollman, MD;  Location: [facility] INVASIVE CV LAB;  Service: Cardiovascular;  Laterality: N/A;   COLONOSCOPY  12/14/2008   Repeat 5 years   ESOPHAGOGASTRODUODENOSCOPY  12/14/2008   Barrett's, repeat 3 years   HEMICOLECTOMY   08/16/2004   Right, for severely dysplastic right colon polyp   HERNIA REPAIR  08/16/2006   UMBILICAL   LUMBAR DISC SURGERY  08/16/1990   L4-5   TONSILLECTOMY     As a child   TUMOR REMOVAL  08/16/2000   Fatty tumor (neck)   UPPER GASTROINTESTINAL ENDOSCOPY     VARICOSE VEIN SURGERY  08/17/1995   Removal, right   VEIN SURGERY  2022   Current Outpatient Medications  Medication Sig Dispense Refill   Ascorbic Acid (VITAMIN C PO) Take 1,000 mg by mouth daily.     aspirin EC 81 MG tablet Take 1 tablet (81 mg total) by mouth daily. 90 tablet 3   atorvastatin (LIPITOR) 10 MG tablet TAKE 1 TABLET BY MOUTH EVERY DAY 90 tablet 3   cholecalciferol (VITAMIN D3) 10 MCG/ML LIQD oral liquid Take 400 Units by mouth daily.     Coenzyme Q10 (CO Q-10 PO) Take 1 tablet by mouth daily.     Glucosamine HCl (GLUCOSAMINE PO) Take 1,500 mg by mouth daily.     Magnesium 250 MG TABS Take by mouth.     Multiple Vitamins-Minerals (ZINC PO) Take 1 tablet by mouth daily.     niacin (TRUE VITAMIN B3) 500 MG tablet Take 500 mg by mouth at bedtime.     Nutritional Supplements (VARICOSE VEINS FORMULA PO) Take 2 tablets by mouth daily.     omeprazole (PRILOSEC OTC) 20 MG tablet Take 20 mg by mouth every other day.     omeprazole (PRILOSEC) 40  MG capsule Take 40 mg by mouth every other day.     saw palmetto 80 MG capsule Take 80 mg by mouth daily.     vitamin B-12 500 MCG tablet Take 1 tablet (500 mcg total) by mouth daily. 30 tablet 0   cyclobenzaprine (FLEXERIL) 10 MG tablet TAKE 1/2 TO 1 TABLET (5-10 MG TOTAL) BY MOUTH EVERY DAY AT BEDTIME AS NEEDED FOR MUSCLE SPASM 30 tablet 1   naproxen sodium (ALEVE) 220 MG tablet Take 220 mg by mouth as needed.     nitroGLYCERIN (NITROSTAT) 0.4 MG SL tablet PLACE 1 TABLET UNDER THE TONGUE EVERY 5 MINUTES AS NEEDED FOR CHEST PAIN. MAX 3 DOSES 25 tablet 0   oseltamivir (TAMIFLU) 75 MG capsule Take 1 capsule (75 mg total) by mouth 2 (two) times daily. (Patient not taking: Reported  on 10/14/2023) 10 capsule 0   Current Facility-Administered Medications  Medication Dose Route Frequency Provider Last Rate Last Admin   0.9 %  sodium chloride infusion  500 mL Intravenous Once Mansouraty, Netty Starring., MD        Current Outpatient Medications:    Ascorbic Acid (VITAMIN C PO), Take 1,000 mg by mouth daily., Disp: , Rfl:    aspirin EC 81 MG tablet, Take 1 tablet (81 mg total) by mouth daily., Disp: 90 tablet, Rfl: 3   atorvastatin (LIPITOR) 10 MG tablet, TAKE 1 TABLET BY MOUTH EVERY DAY, Disp: 90 tablet, Rfl: 3   cholecalciferol (VITAMIN D3) 10 MCG/ML LIQD oral liquid, Take 400 Units by mouth daily., Disp: , Rfl:    Coenzyme Q10 (CO Q-10 PO), Take 1 tablet by mouth daily., Disp: , Rfl:    Glucosamine HCl (GLUCOSAMINE PO), Take 1,500 mg by mouth daily., Disp: , Rfl:    Magnesium 250 MG TABS, Take by mouth., Disp: , Rfl:    Multiple Vitamins-Minerals (ZINC PO), Take 1 tablet by mouth daily., Disp: , Rfl:    niacin (TRUE VITAMIN B3) 500 MG tablet, Take 500 mg by mouth at bedtime., Disp: , Rfl:    Nutritional Supplements (VARICOSE VEINS FORMULA PO), Take 2 tablets by mouth daily., Disp: , Rfl:    omeprazole (PRILOSEC OTC) 20 MG tablet, Take 20 mg by mouth every other day., Disp: , Rfl:    omeprazole (PRILOSEC) 40 MG capsule, Take 40 mg by mouth every other day., Disp: , Rfl:    saw palmetto 80 MG capsule, Take 80 mg by mouth daily., Disp: , Rfl:    vitamin B-12 500 MCG tablet, Take 1 tablet (500 mcg total) by mouth daily., Disp: 30 tablet, Rfl: 0   cyclobenzaprine (FLEXERIL) 10 MG tablet, TAKE 1/2 TO 1 TABLET (5-10 MG TOTAL) BY MOUTH EVERY DAY AT BEDTIME AS NEEDED FOR MUSCLE SPASM, Disp: 30 tablet, Rfl: 1   naproxen sodium (ALEVE) 220 MG tablet, Take 220 mg by mouth as needed., Disp: , Rfl:    nitroGLYCERIN (NITROSTAT) 0.4 MG SL tablet, PLACE 1 TABLET UNDER THE TONGUE EVERY 5 MINUTES AS NEEDED FOR CHEST PAIN. MAX 3 DOSES, Disp: 25 tablet, Rfl: 0   oseltamivir (TAMIFLU) 75 MG  capsule, Take 1 capsule (75 mg total) by mouth 2 (two) times daily. (Patient not taking: Reported on 10/14/2023), Disp: 10 capsule, Rfl: 0  Current Facility-Administered Medications:    0.9 %  sodium chloride infusion, 500 mL, Intravenous, Once, Mansouraty, Netty Starring., MD Allergies  Allergen Reactions   Penicillins Other (See Comments)    REACTION: Rash as a child - Tolerated AMOXICILLIN SEVERAL  TIMES AS ADULT RECENTLY Has patient had a PCN reaction causing immediate rash, facial/tongue/throat swelling, SOB or lightheadedness with hypotension: YES Has patient had a PCN reaction causing severe rash involving mucus membranes or skin necrosis:NO Has patient had a PCN reaction that required hospitalization NO Has patient had a PCN reaction occurring within the last 10 years: NO If all of the above answers are "NO", then may proceed with    Family History  Problem Relation Age of Onset   Lymphoma Mother    Hypertension Father    Diabetes Father    Heart disease Father    Alcohol abuse Paternal Grandfather    Colon cancer Neg Hx    Esophageal cancer Neg Hx    Rectal cancer Neg Hx    Stomach cancer Neg Hx    Colon polyps Neg Hx    Social History   Socioeconomic History   Marital status: Married    Spouse name: Not on file   Number of children: 3   Years of education: Not on file   Highest education level: Not on file  Occupational History   Occupation: Education officer, environmental, Designer, television/film set    Employer: [employer]  Tobacco Use   Smoking status: Never   Smokeless tobacco: Never  Vaping Use   Vaping status: Never Used  Substance and Sexual Activity   Alcohol use: No   Drug use: No   Sexual activity: Not on file  Other Topics Concern   Not on file  Social History Narrative   Lives in [place] with his wife.  Ambulance person.   Social Drivers of Corporate investment banker Strain: Low Risk  (09/12/2023)   Overall Financial Resource Strain (CARDIA)    Difficulty of  Paying Living Expenses: Not hard at all  Food Insecurity: No Food Insecurity (09/12/2023)   Hunger Vital Sign    Worried About Running Out of Food in the Last Year: Never true    Ran Out of Food in the Last Year: Never true  Transportation Needs: No Transportation Needs (09/12/2023)   PRAPARE - Administrator, Civil Service (Medical): No    Lack of Transportation (Non-Medical): No  Physical Activity: Sufficiently Active (09/12/2023)   Exercise Vital Sign    Days of Exercise per Week: 3 days    Minutes of Exercise per Session: 100 min  Stress: No Stress Concern Present (09/12/2023)   [employer] of Occupational Health - Occupational Stress Questionnaire    Feeling of Stress : Not at all  Social Connections: Socially Integrated (09/12/2023)   Social Connection and Isolation Panel [NHANES]    Frequency of Communication with Friends and Family: More than three times a week    Frequency of Social Gatherings with Friends and Family: More than three times a week    Attends Religious Services: More than 4 times per year    Active Member of Clubs or Organizations: Yes    Attends Banker Meetings: More than 4 times per year    Marital Status: Married  Catering manager Violence: Not At Risk (09/12/2023)   Humiliation, Afraid, Rape, and Kick questionnaire    Fear of Current or Ex-Partner: No    Emotionally Abused: No    Physically Abused: No    Sexually Abused: No    Physical Exam: Today's Vitals   11/02/23 0842 11/02/23 0852  BP: 129/85   Pulse: 82   Temp: (!) 97.3 F (36.3 C) (!) 97.3 F (36.3  C)  SpO2: 100%   Weight: 208 lb (94.3 kg)   Height: 6' (1.829 m)    Body mass index is 28.21 kg/m. GEN: NAD EYE: Sclerae anicteric ENT: MMM CV: Non-tachycardic GI: Soft, NT/ND NEURO:  Alert & Oriented x 3  Lab Results: No results for input(s): "WBC", "HGB", "HCT", "PLT" in the last 72 hours. BMET No results for input(s): "NA", "K", "CL", "CO2", "GLUCOSE",  "BUN", "CREATININE", "CALCIUM" in the last 72 hours. LFT No results for input(s): "PROT", "ALBUMIN", "AST", "ALT", "ALKPHOS", "BILITOT", "BILIDIR", "IBILI" in the last 72 hours. PT/INR No results for input(s): "LABPROT", "INR" in the last 72 hours.   Impression / Plan: This is a 73 y.o.male  who presents for colonoscopy for surveillance in setting of previous advanced adenoma status post hemicolectomy and other polyps and for EGD for history of Barrett's esophagus.  The risks and benefits of endoscopic evaluation/treatment were discussed with the patient and/or family; these include but are not limited to the risk of perforation, infection, bleeding, missed lesions, lack of diagnosis, severe illness requiring hospitalization, as well as anesthesia and sedation related illnesses.  The patient's history has been reviewed, patient examined, no change in status, and deemed stable for procedure.  The patient and/or family is agreeable to proceed.    Corliss Parish, MD  Gastroenterology Advanced Endoscopy Office # [phone]

## 2023-11-02 NOTE — Progress Notes (Signed)
 Pt's states no medical or surgical changes since previsit or office visit.

## 2023-11-02 NOTE — Progress Notes (Signed)
 Called to room to assist during endoscopic procedure.  Patient ID and intended procedure confirmed with present staff. Received instructions for my participation in the procedure from the performing physician.

## 2023-11-02 NOTE — Op Note (Signed)
 [facility] Patient Name: Phillip [name] Procedure Date: 11/02/2023 9:32 AM MRN: [id number] Endoscopist: Corliss Parish , MD, [id number] Age: 73 Referring MD:  Date of Birth: 06-21-1951 Gender: Male Account #: [id number] Procedure:                Colonoscopy Indications:              High risk colon cancer surveillance: Personal                            history of colonic polyps, Surveillance: Personal                            history of adenomatous polyps on last colonoscopy >                            5 years ago, High risk colon cancer surveillance:                            Personal history of adenoma (10 mm or greater in                            size), High risk colon cancer surveillance:                            Personal history of adenoma with high grade                            dysplasia Medicines:                Monitored Anesthesia Care Procedure:                Pre-Anesthesia Assessment:                           - Prior to the procedure, a History and Physical                            was performed, and patient medications and                            allergies were reviewed. The patient's tolerance of                            previous anesthesia was also reviewed. The risks                            and benefits of the procedure and the sedation                            options and risks were discussed with the patient.                            All questions were answered, and informed consent  was obtained. Prior Anticoagulants: The patient has                            taken no anticoagulant or antiplatelet agents                            except for aspirin. ASA Grade Assessment: III - A                            patient with severe systemic disease. After                            reviewing the risks and benefits, the patient was                            deemed in satisfactory condition to undergo the                             procedure.                           After obtaining informed consent, the colonoscope                            was passed under direct vision. Throughout the                            procedure, the patient's blood pressure, pulse, and                            oxygen saturations were monitored continuously. The                            CF [id number] #[id number] was introduced through the anus                            and advanced to the the ileocolonic anastomosis.                            The colonoscopy was somewhat difficult due to                            significant looping and a tortuous colon.                            Successful completion of the procedure was aided by                            changing the patient's position, using manual                            pressure, straightening and shortening the scope to  obtain bowel loop reduction and using scope                            torsion. The patient tolerated the procedure. The                            quality of the bowel preparation was adequate. Scope In: 10:40:58 AM Scope Out: 10:58:06 AM Scope Withdrawal Time: 0 hours 10 minutes 46 seconds  Total Procedure Duration: 0 hours 17 minutes 8 seconds  Findings:                 The digital rectal exam findings include                            hemorrhoids. Pertinent negatives include no                            palpable rectal lesions.                           The left colon was moderately tortuous.                           The colon (entire examined portion) revealed                            significantly excessive looping.                           There was evidence of a prior functional end-to-end                            ileo-colonic anastomosis in the transverse colon.                            This was patent and was characterized by healthy                            appearing mucosa. The  anastomosis was traversed.                           A 3 mm polyp was found in the recto-sigmoid colon.                            The polyp was sessile. The polyp was removed with a                            cold snare. Resection and retrieval were complete.                           A tattoo was seen in the recto-sigmoid colon. The                            tattoo site appeared normal.  Normal mucosa was found in the entire colon                            otherwise.                           Non-bleeding non-thrombosed external and internal                            hemorrhoids were found during retroflexion, during                            perianal exam and during digital exam. The                            hemorrhoids were Grade II (internal hemorrhoids                            that prolapse but reduce spontaneously). Complications:            No immediate complications. Estimated Blood Loss:     Estimated blood loss was minimal. Impression:               - Hemorrhoids found on digital rectal exam.                           - Tortuous left colon.                           - There was significant looping of the entire colon.                           - Patent functional end-to-end ileo-colonic                            anastomosis, characterized by healthy appearing                            mucosa.                           - One 3 mm polyp at the recto-sigmoid colon,                            removed with a cold snare. Resected and retrieved.                           - A tattoo was seen in the recto-sigmoid colon.                            Normal mucosa in the entire examined colon                            otherwise.                           - Non-bleeding non-thrombosed  external and internal                            hemorrhoids. Recommendation:           - The patient will be observed post-procedure,                            until all  discharge criteria are met.                           - Discharge patient to home.                           - Patient has a contact number available for                            emergencies. The signs and symptoms of potential                            delayed complications were discussed with the                            patient. Return to normal activities tomorrow.                            Written discharge instructions were provided to the                            patient.                           - High fiber diet.                           - Use FiberCon 1-2 tablets PO daily.                           - Continue present medications.                           - Await pathology results.                           - Repeat colonoscopy in 5 years for surveillance,                            no matter pathology due to history of previous                            surgically resected advanced adenoma.                           - The findings and recommendations were discussed                            with the patient.                           -  The findings and recommendations were discussed                            with the patient's family. Corliss Parish, MD 11/02/2023 11:10:14 AM

## 2023-11-03 ENCOUNTER — Telehealth: Payer: Self-pay | Admitting: *Deleted

## 2023-11-03 NOTE — Telephone Encounter (Signed)
 Attempted f/u phone call. No answer. Left message.

## 2023-11-04 LAB — SURGICAL PATHOLOGY

## 2023-11-08 ENCOUNTER — Encounter: Payer: Self-pay | Admitting: Gastroenterology

## 2023-11-10 DIAGNOSIS — H43393 Other vitreous opacities, bilateral: Secondary | ICD-10-CM | POA: Diagnosis not present

## 2023-11-17 DIAGNOSIS — I2510 Atherosclerotic heart disease of native coronary artery without angina pectoris: Secondary | ICD-10-CM | POA: Diagnosis not present

## 2023-11-17 DIAGNOSIS — L819 Disorder of pigmentation, unspecified: Secondary | ICD-10-CM | POA: Diagnosis not present

## 2023-11-17 DIAGNOSIS — R600 Localized edema: Secondary | ICD-10-CM | POA: Diagnosis not present

## 2023-11-17 DIAGNOSIS — I87393 Chronic venous hypertension (idiopathic) with other complications of bilateral lower extremity: Secondary | ICD-10-CM | POA: Diagnosis not present

## 2023-11-17 DIAGNOSIS — I872 Venous insufficiency (chronic) (peripheral): Secondary | ICD-10-CM | POA: Diagnosis not present

## 2023-12-16 IMAGING — DX DG KNEE COMPLETE 4+V*L*
4 series · 4 of 4 positions shown · non-contrast
Comparison: None Available.

CLINICAL DATA: Presumed osteoarthritis

EXAM:
LEFT KNEE - COMPLETE 4+ VIEW

[knee ap]
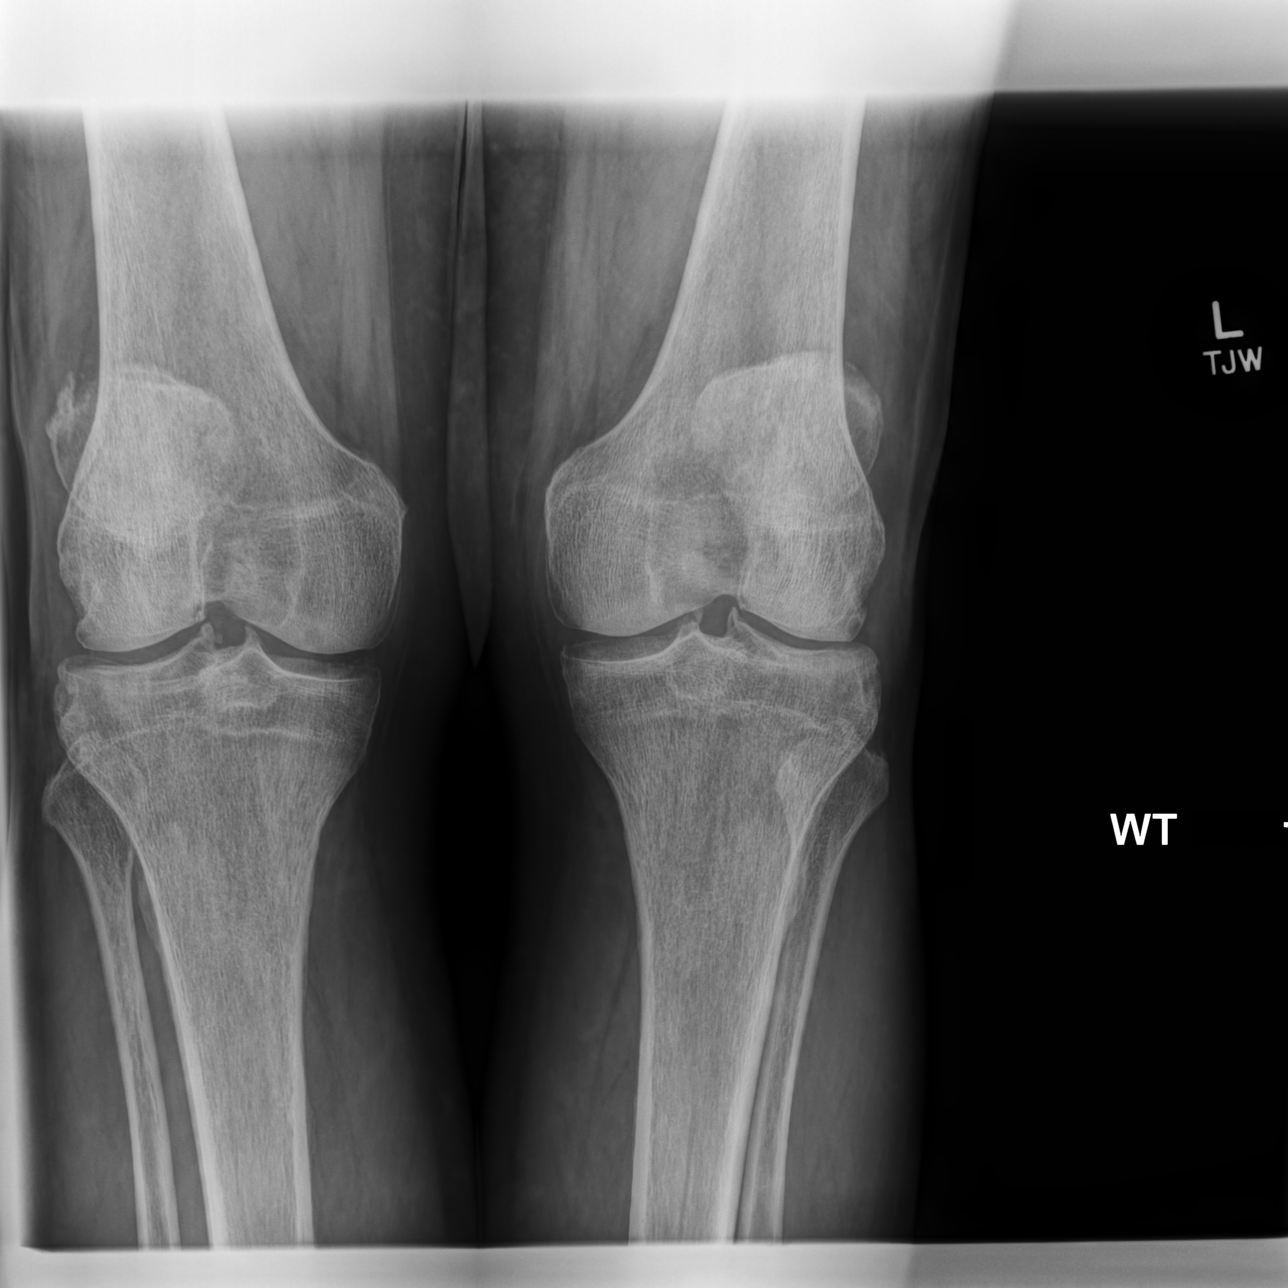

[knee lat]
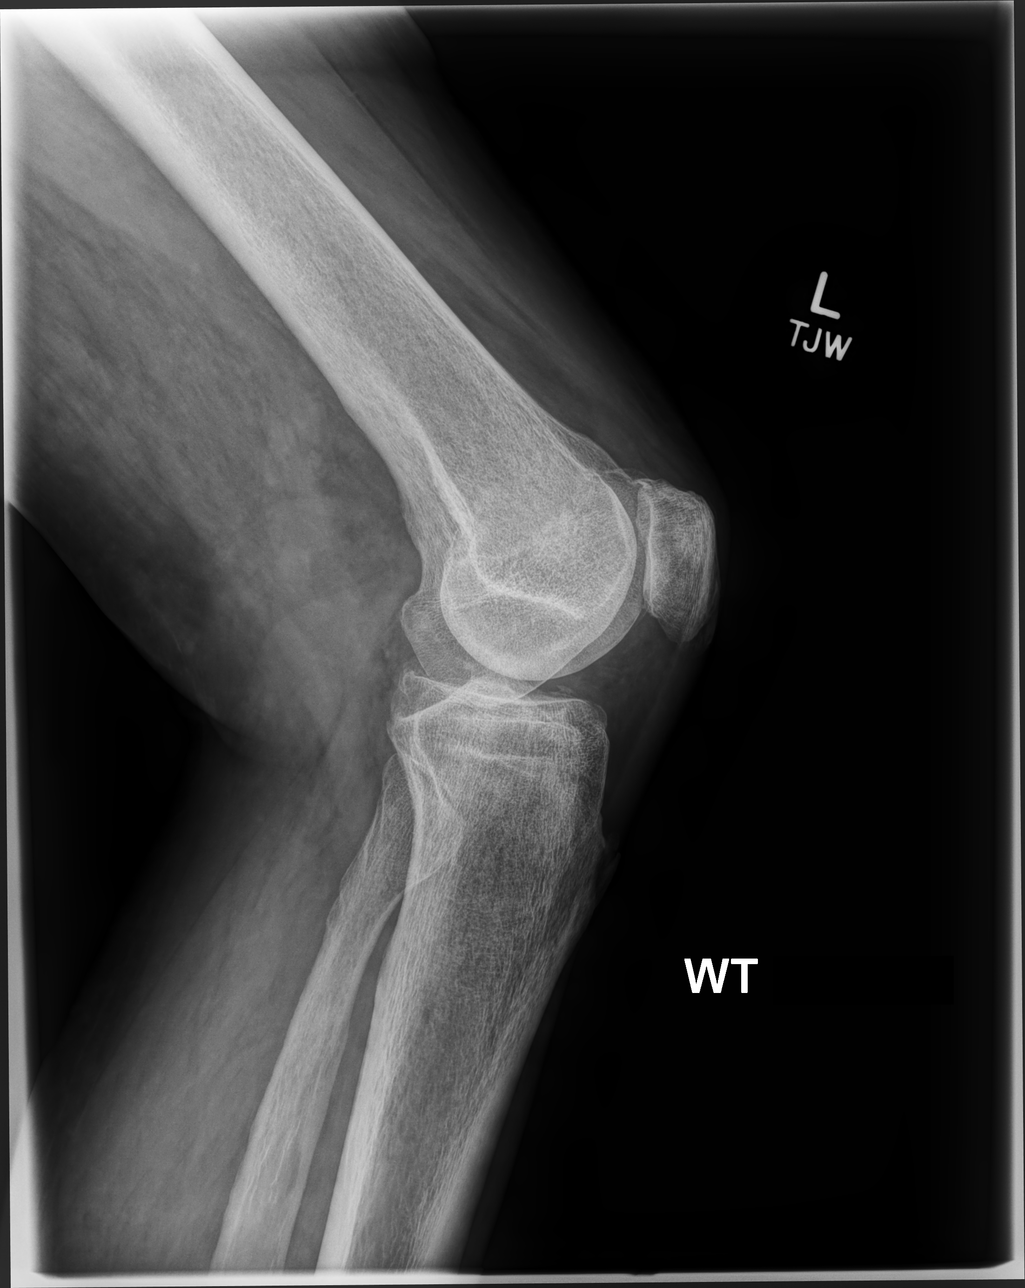

[knee [person_name] view pa]
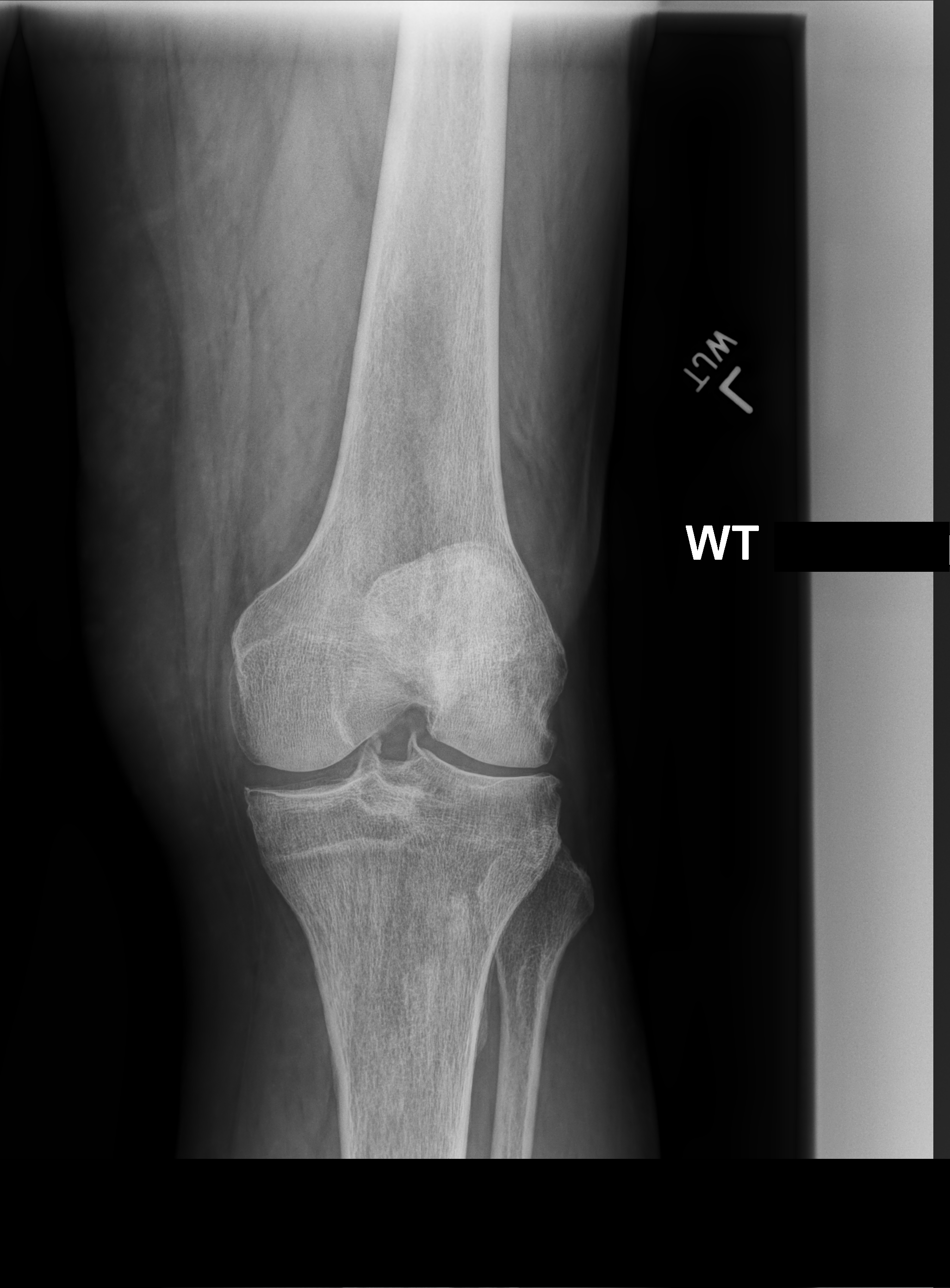

[patella (sunrise) tan]
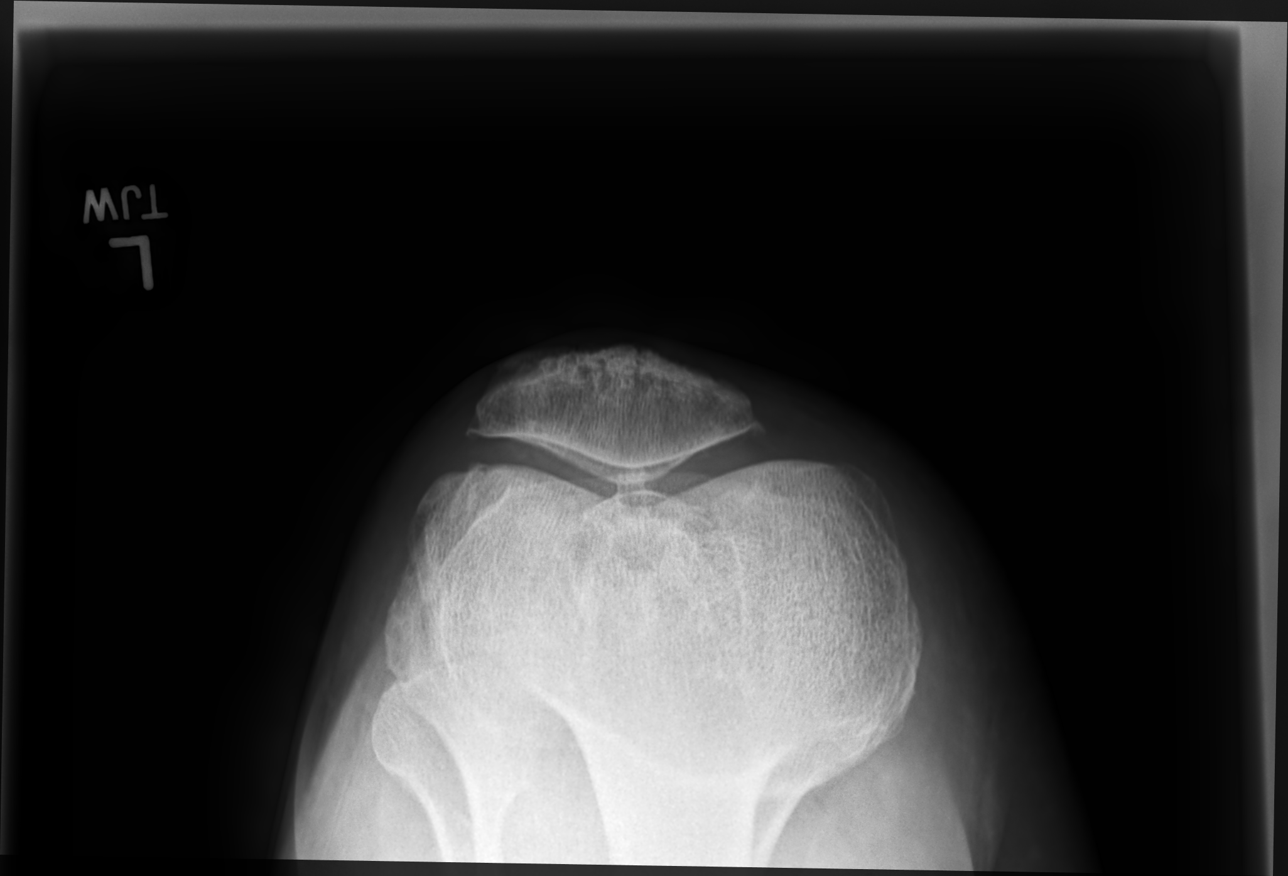

[4 of 4 positions shown; findings below may reference images not displayed]

FINDINGS: A single anterior view of the right knee demonstrates lateral
degenerative changes and enthesopathic changes off the superior
patella.

The left knee demonstrates no fracture, dislocation, or bony lesion.
Degenerative changes are seen in the patellofemoral compartment.
Mild degenerative changes in the medial and lateral compartments
with no medial joint space loss. There may be some mild lateral
joint space loss. No other abnormalities.
IMPRESSION: 1. On a single anterior view of the right knee, degenerative changes
are identified in the lateral compartment and enthesopathic changes
are associated with the patella.
2. Tricompartmental degenerative changes are identified on the left.
There may be mild joint space loss in the lateral compartment. No
other abnormalities on the left.

## 2024-01-17 ENCOUNTER — Ambulatory Visit: Admitting: Physician Assistant

## 2024-01-17 ENCOUNTER — Encounter: Payer: Self-pay | Admitting: Physician Assistant

## 2024-01-17 VITALS — BP 132/73

## 2024-01-17 DIAGNOSIS — W908XXA Exposure to other nonionizing radiation, initial encounter: Secondary | ICD-10-CM | POA: Diagnosis not present

## 2024-01-17 DIAGNOSIS — L578 Other skin changes due to chronic exposure to nonionizing radiation: Secondary | ICD-10-CM | POA: Diagnosis not present

## 2024-01-17 DIAGNOSIS — Z1283 Encounter for screening for malignant neoplasm of skin: Secondary | ICD-10-CM | POA: Diagnosis not present

## 2024-01-17 DIAGNOSIS — L814 Other melanin hyperpigmentation: Secondary | ICD-10-CM

## 2024-01-17 DIAGNOSIS — Z8589 Personal history of malignant neoplasm of other organs and systems: Secondary | ICD-10-CM

## 2024-01-17 DIAGNOSIS — L84 Corns and callosities: Secondary | ICD-10-CM

## 2024-01-17 DIAGNOSIS — L570 Actinic keratosis: Secondary | ICD-10-CM | POA: Diagnosis not present

## 2024-01-17 DIAGNOSIS — D229 Melanocytic nevi, unspecified: Secondary | ICD-10-CM

## 2024-01-17 DIAGNOSIS — Z85828 Personal history of other malignant neoplasm of skin: Secondary | ICD-10-CM

## 2024-01-17 DIAGNOSIS — D1801 Hemangioma of skin and subcutaneous tissue: Secondary | ICD-10-CM | POA: Diagnosis not present

## 2024-01-17 DIAGNOSIS — L821 Other seborrheic keratosis: Secondary | ICD-10-CM | POA: Diagnosis not present

## 2024-01-17 NOTE — Patient Instructions (Signed)

## 2024-01-17 NOTE — Progress Notes (Signed)
 New Patient Visit   Subjective  Phillip [name] is a 73 y.o. male who presents for the following: Skin Cancer Screening and Full Body Skin Exam. Patient previously seen at [facility] in [place]. 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   The patient presents for Total-Body Skin Exam (TBSE) for skin cancer screening and mole check. The patient has spots, moles and lesions to be evaluated, some may be new or changing and the patient may have concern these could be cancer.    The following portions of the chart were reviewed this encounter and updated as appropriate: medications, allergies, medical history  Review of Systems:  No other skin or systemic complaints except as noted in HPI or Assessment and Plan.  Objective  Well appearing patient in no apparent distress; mood and affect are within normal limits.  A full examination was performed including scalp, head, eyes, ears, nose, lips, neck, chest, axillae, abdomen, back, buttocks, bilateral upper extremities, bilateral lower extremities, hands, feet, fingers, toes, fingernails, and toenails. All findings within normal limits unless otherwise noted below.   Relevant physical exam findings are noted in the Assessment and Plan.  Right Ear x 1, right cheek x 2 (3) Erythematous thin papules/macules with gritty scale.   Assessment & Plan   SKIN CANCER SCREENING PERFORMED TODAY.  ACTINIC DAMAGE - Chronic condition, secondary to cumulative UV/sun exposure - diffuse scaly erythematous macules with underlying dyspigmentation - Recommend daily broad spectrum sunscreen SPF 30+ to sun-exposed areas, reapply every 2 hours as needed.  - Staying in the shade or wearing long sleeves, sun glasses (UVA+UVB protection) and wide brim hats (4-inch brim around the entire circumference of the hat) are also recommended for sun protection.  - Call for new or changing lesions.  LENTIGINES,  SEBORRHEIC KERATOSES, Cherry angiomas - Benign normal skin lesions - Benign-appearing - Call for any changes  MELANOCYTIC NEVI - Tan-brown and/or pink-flesh-colored symmetric macules and papules - Benign appearing on exam today - Observation - Call clinic for new or changing moles - Recommend daily use of broad spectrum spf 30+ sunscreen to sun-exposed areas.   HISTORY OF SQUAMOUS CELL CARCINOMA OF THE SKIN - No evidence of recurrence today - No lymphadenopathy - Recommend regular full body skin exams - Recommend daily broad spectrum sunscreen SPF 30+ to sun-exposed areas, reapply every 2 hours as needed.  - Call if any new or changing lesions are noted between office visits   CLAVUS Exam: Hyperkeratotic papule of left index finger  Treatment Plan: Recommend paring.   AK (ACTINIC KERATOSIS) (3) Right Ear x 1, right cheek x 2 (3) Destruction of lesion - Right Ear x 1, right cheek x 2 (3) Complexity: simple   Destruction method: cryotherapy   Informed consent: discussed and consent obtained   Timeout:  patient name, date of birth, surgical site, and procedure verified Lesion destroyed using liquid nitrogen: Yes   Region frozen until ice ball extended beyond lesion: Yes   Outcome: patient tolerated procedure well with no complications   Post-procedure details: wound care instructions given   CHERRY ANGIOMA   MULTIPLE BENIGN NEVI   ACTINIC SKIN DAMAGE   SEBORRHEIC KERATOSIS   LENTIGINES   HISTORY OF SQUAMOUS CELL CARCINOMA   CLAVUS   SKIN CANCER SCREENING    Return in about 6 months (around 07/18/2024) for AK follow up and UBSE.  I, Eliot Guernsey, CMA, am acting as scribe for Marliyah Reid K,  PA-C .   Documentation: I have reviewed the above documentation for accuracy and completeness, and I agree with the above.  Fitz Matsuo K, PA-C

## 2024-02-29 ENCOUNTER — Encounter: Payer: Self-pay | Admitting: Family Medicine

## 2024-02-29 ENCOUNTER — Ambulatory Visit: Payer: Self-pay

## 2024-02-29 ENCOUNTER — Ambulatory Visit (INDEPENDENT_AMBULATORY_CARE_PROVIDER_SITE_OTHER): Admitting: Family Medicine

## 2024-02-29 VITALS — BP 130/78 | HR 57 | Temp 97.9°F | Ht 72.0 in | Wt 216.4 lb

## 2024-02-29 DIAGNOSIS — R519 Headache, unspecified: Secondary | ICD-10-CM | POA: Insufficient documentation

## 2024-02-29 MED ORDER — CEPHALEXIN 500 MG PO CAPS: 500.0000 mg | ORAL_CAPSULE | Freq: Three times a day (TID) | ORAL | 0 refills | Status: DC

## 2024-02-29 MED ORDER — TRIAMCINOLONE ACETONIDE 0.1 % EX CREA: 1.0000 | TOPICAL_CREAM | Freq: Two times a day (BID) | CUTANEOUS | 0 refills | Status: AC

## 2024-02-29 NOTE — Patient Instructions (Addendum)
 Area to skin where pain comes from is red and inflamed.  Possible neuralgia, possible scalp infection after bug bite.  Treat with keflex  antibiotic for 5 days, may also use topical steroid to affected area for next 7-10 days.  May continue tylenol  as well.  Let us  know if new or worsening symptoms, or if not better with above.

## 2024-02-29 NOTE — Telephone Encounter (Signed)
 Seen today. He has upcoming appt with PCP in a few weeks.

## 2024-02-29 NOTE — Progress Notes (Signed)
 Ph: [phone] Fax: [phone]   Patient ID: Phillip [name], male    DOB: 03/11/1951, 73 y.o.   MRN: [id number]  This visit was conducted in person.  BP 130/78   Pulse (!) 57   Temp 97.9 F (36.6 C) (Oral)   Ht 6' (1.829 m)   Wt 216 lb 6 oz (98.1 kg)   SpO2 100%   BMI 29.35 kg/m    CC: swollen gland to R head  Subjective:   HPI: Phillip [name] is a 73 y.o. male presenting on 02/29/2024 for Cyst (C/o swollen lymph node in top R side of  head. Started 2-3 days)   Ines 2-3d h/o sharp pains to right scalp with radiation of pain down posterior ear to neck. Very sharp/intense pain, possible electrical shock, lasts seconds. Previously could happen about once a month, but over the past few days it's becoming more frequent. Woke him up from sleep this morning.   He has been in the heat for the past few days - lots of sweating.   He recently started total restore supplement.   Tried tylenol  for this with some benefit.   No recent fevers/chills, viral infection  Remote history of lyme disease.  No pain to temple or vision changes noted.      Relevant past medical, surgical, family and social history reviewed and updated as indicated. Interim medical history since our last visit reviewed. Allergies and medications reviewed and updated. Outpatient Medications Prior to Visit  Medication Sig Dispense Refill   Ascorbic Acid (VITAMIN C PO) Take 1,000 mg by mouth daily.     aspirin  EC 81 MG tablet Take 1 tablet (81 mg total) by mouth daily. 90 tablet 3   atorvastatin  (LIPITOR) 10 MG tablet TAKE 1 TABLET BY MOUTH EVERY DAY 90 tablet 3   cholecalciferol (VITAMIN D3) 10 MCG/ML LIQD oral liquid Take 400 Units by mouth daily.     Coenzyme Q10 (CO Q-10 PO) Take 1 tablet by mouth daily.     cyclobenzaprine  (FLEXERIL ) 10 MG tablet TAKE 1/2 TO 1 TABLET (5-10 MG TOTAL) BY MOUTH EVERY DAY AT BEDTIME AS NEEDED FOR MUSCLE SPASM 30 tablet 1   Glucosamine HCl (GLUCOSAMINE PO) Take 1,500  mg by mouth daily.     Magnesium 250 MG TABS Take by mouth.     Multiple Vitamins-Minerals (ZINC PO) Take 1 tablet by mouth daily.     naproxen sodium (ALEVE) 220 MG tablet Take 220 mg by mouth as needed.     niacin (TRUE VITAMIN B3) 500 MG tablet Take 500 mg by mouth at bedtime.     nitroGLYCERIN  (NITROSTAT ) 0.4 MG SL tablet PLACE 1 TABLET UNDER THE TONGUE EVERY 5 MINUTES AS NEEDED FOR CHEST PAIN. MAX 3 DOSES 25 tablet 0   Nutritional Supplements (VARICOSE VEINS FORMULA PO) Take 2 tablets by mouth daily.     omeprazole (PRILOSEC OTC) 20 MG tablet Take 20 mg by mouth every other day.     omeprazole (PRILOSEC) 40 MG capsule Take 40 mg by mouth every other day.     oseltamivir  (TAMIFLU ) 75 MG capsule Take 1 capsule (75 mg total) by mouth 2 (two) times daily. 10 capsule 0   saw palmetto  80 MG capsule Take 80 mg by mouth daily.     vitamin B-12 500 MCG tablet Take 1 tablet (500 mcg total) by mouth daily. 30 tablet 0   No facility-administered medications prior to visit.     Per HPI unless  specifically indicated in ROS section below Review of Systems  Objective:  BP 130/78   Pulse (!) 57   Temp 97.9 F (36.6 C) (Oral)   Ht 6' (1.829 m)   Wt 216 lb 6 oz (98.1 kg)   SpO2 100%   BMI 29.35 kg/m   Wt Readings from Last 3 Encounters:  02/29/24 216 lb 6 oz (98.1 kg)  11/02/23 208 lb (94.3 kg)  10/14/23 212 lb (96.2 kg)      Physical Exam Vitals and nursing note reviewed.  Constitutional:      Appearance: Normal appearance. He is not ill-appearing.  HENT:     Head: Normocephalic and atraumatic.      Comments:  Erythema to right parietal scalp, mildly tender to palpation without break in skin appreciated No tenderness to occipital region No tenderness to right temporal region    Right Ear: Tympanic membrane, ear canal and external ear normal. There is no impacted cerumen.     Left Ear: Tympanic membrane, ear canal and external ear normal. There is no impacted cerumen.      Mouth/Throat:     Mouth: Mucous membranes are moist.     Pharynx: Oropharynx is clear. No oropharyngeal exudate or posterior oropharyngeal erythema.  Eyes:     Extraocular Movements: Extraocular movements intact.     Conjunctiva/sclera: Conjunctivae normal.     Pupils: Pupils are equal, round, and reactive to light.  Cardiovascular:     Rate and Rhythm: Normal rate and regular rhythm.     Pulses: Normal pulses.     Heart sounds: Normal heart sounds. No murmur heard. Pulmonary:     Effort: Pulmonary effort is normal. No respiratory distress.     Breath sounds: Normal breath sounds. No wheezing, rhonchi or rales.  Musculoskeletal:     Cervical back: Normal range of motion and neck supple.  Lymphadenopathy:     Head:     Right side of head: No submental, submandibular, tonsillar, preauricular, posterior auricular or occipital adenopathy.     Left side of head: No submental, submandibular, tonsillar, preauricular, posterior auricular or occipital adenopathy.     Cervical: No cervical adenopathy.     Upper Body:     Right upper body: No supraclavicular adenopathy.     Left upper body: No supraclavicular adenopathy.  Skin:    General: Skin is warm and dry.     Findings: Erythema (see HEENT section) present. No rash.  Neurological:     General: No focal deficit present.     Mental Status: He is alert.     Cranial Nerves: Cranial nerves 2-12 are intact.     Sensory: Sensation is intact.     Motor: Motor function is intact.     Coordination: Coordination is intact.     Gait: Gait is intact.     Comments:  CN 2-12 intact FTN intact EOMI   Psychiatric:        Mood and Affect: Mood normal.        Behavior: Behavior normal.         Assessment & Plan:   Problem List Items Addressed This Visit     Pain of scalp - Primary   Pain stems from R parietal scalp with radiation down to occipital region and occasionally into temple. Description of pain sounds neuropathic but he also has  some erythema to R parietal scalp. Location of pain not consistent with temporal arteritis, trigeminal neuralgia - ?occipital neuralgia component. Will treat for  possible cellulitis given associated erythema - keflex  5d course + topical triamcinolone  cream. Update if not improving with treatment.         Meds ordered this encounter  Medications   triamcinolone  cream (KENALOG ) 0.1 %    Sig: Apply 1 Application topically 2 (two) times daily.    Dispense:  30 g    Refill:  0   cephALEXin  (KEFLEX ) 500 MG capsule    Sig: Take 1 capsule (500 mg total) by mouth 3 (three) times daily.    Dispense:  15 capsule    Refill:  0    No orders of the defined types were placed in this encounter.   Patient Instructions  Area to skin where pain comes from is red and inflamed.  Possible neuralgia, possible scalp infection after bug bite.  Treat with keflex  antibiotic for 5 days, may also use topical steroid to affected area for next 7-10 days.  May continue tylenol  as well.  Let us  know if new or worsening symptoms, or if not better with above.   Follow up plan: Return if symptoms worsen or fail to improve.  Anton Blas, MD

## 2024-02-29 NOTE — Assessment & Plan Note (Addendum)
 Pain stems from R parietal scalp with radiation down to occipital region and occasionally into temple. Description of pain sounds neuropathic but he also has some erythema to R parietal scalp. Location of pain not consistent with temporal arteritis, trigeminal neuralgia - ?occipital neuralgia component. Will treat for possible cellulitis given associated erythema - keflex  5d course + topical triamcinolone  cream. Update if not improving with treatment.

## 2024-02-29 NOTE — Telephone Encounter (Signed)
 FYI Only or Action Required?: FYI only for provider.  Patient was last seen in primary care on 09/15/2023 by Avelina Greig BRAVO, MD.  Called Nurse Triage reporting swollen lymphnodes.  Symptoms began several days ago.  Interventions attempted: Nothing.  Symptoms are: unchanged.  Triage Disposition: No disposition on file.  Patient/caregiver understands and will follow disposition?:     Copied from CRM [id number]. Topic: Clinical - Red Word Triage >> Feb 29, 2024  8:02 AM Franky GRADE wrote: Red Word that prompted transfer to Nurse Triage: Patient is experiencing swollen lymph nodes for the last couple of day and have started to hurt. Reason for Disposition  [1] Swelling is painful to touch AND [2] no fever  Answer Assessment - Initial Assessment Questions 1. APPEARANCE of SWELLING: What does it look like?     Unable to see, on back of head 2. SIZE: How large is the swelling? (e.g., inches, cm; or compare to size of pinhead, tip of pen, eraser, coin, pea, grape, ping pong ball)      Size of pea 3. LOCATION: Where is the swelling located?     Back of head 4. ONSET: When did the swelling start?     Couple days ago 5. COLOR: What color is it? Is there more than one color?     Na can't see 6. PAIN: Is there any pain? If Yes, ask: How bad is the pain? (Scale 1-10; or mild, moderate, severe)       moderate 7. ITCH: Does it itch? If Yes, ask: How bad is the itch?      no 8. CAUSE: What do you think caused the swelling?     unknown 9 OTHER SYMPTOMS: Do you have any other symptoms? (e.g., fever)     denies  Protocols used: Skin Lump or Localized Swelling-A-AH

## 2024-03-13 ENCOUNTER — Telehealth: Payer: Self-pay | Admitting: *Deleted

## 2024-03-13 ENCOUNTER — Other Ambulatory Visit: Payer: Self-pay | Admitting: Family Medicine

## 2024-03-13 DIAGNOSIS — Z125 Encounter for screening for malignant neoplasm of prostate: Secondary | ICD-10-CM

## 2024-03-13 DIAGNOSIS — R001 Bradycardia, unspecified: Secondary | ICD-10-CM

## 2024-03-13 DIAGNOSIS — E785 Hyperlipidemia, unspecified: Secondary | ICD-10-CM

## 2024-03-13 DIAGNOSIS — Z79899 Other long term (current) drug therapy: Secondary | ICD-10-CM

## 2024-03-13 DIAGNOSIS — R7309 Other abnormal glucose: Secondary | ICD-10-CM

## 2024-03-13 NOTE — Telephone Encounter (Addendum)
 Future orders in Epic.  Mark notified by telephone.

## 2024-03-13 NOTE — Telephone Encounter (Signed)
 Copied from CRM [id number]. Topic: Clinical - Request for Lab/Test Order >> Mar 13, 2024  8:35 AM Suzen RAMAN wrote: Reason for CRM: Patient would like to have labs order prior to Physical scheduled on Aug 6th. Patient would like to have labs drawn at [address] [place], [place] [address].

## 2024-03-14 ENCOUNTER — Other Ambulatory Visit (INDEPENDENT_AMBULATORY_CARE_PROVIDER_SITE_OTHER)

## 2024-03-14 ENCOUNTER — Encounter: Payer: Medicare HMO | Admitting: Family Medicine

## 2024-03-14 DIAGNOSIS — E785 Hyperlipidemia, unspecified: Secondary | ICD-10-CM | POA: Diagnosis not present

## 2024-03-14 DIAGNOSIS — R001 Bradycardia, unspecified: Secondary | ICD-10-CM | POA: Diagnosis not present

## 2024-03-14 DIAGNOSIS — Z79899 Other long term (current) drug therapy: Secondary | ICD-10-CM

## 2024-03-14 DIAGNOSIS — Z125 Encounter for screening for malignant neoplasm of prostate: Secondary | ICD-10-CM | POA: Diagnosis not present

## 2024-03-14 DIAGNOSIS — R7309 Other abnormal glucose: Secondary | ICD-10-CM

## 2024-03-14 LAB — BASIC METABOLIC PANEL WITH GFR
BUN: 18 mg/dL (ref 6–23)
CO2: 28 meq/L (ref 19–32)
Calcium: 9 mg/dL (ref 8.4–10.5)
Chloride: 106 meq/L (ref 96–112)
Creatinine, Ser: 1.19 mg/dL (ref 0.40–1.50)
GFR: 60.73 mL/min (ref >60.00)
Glucose, Bld: 101 mg/dL — ABNORMAL HIGH (ref 70–99)
Potassium: 4.5 meq/L (ref 3.5–5.1)
Sodium: 140 meq/L (ref 135–145)

## 2024-03-14 LAB — HEPATIC FUNCTION PANEL
ALT: 15 U/L (ref 0–53)
AST: 20 U/L (ref 0–37)
Albumin: 3.9 g/dL (ref 3.5–5.2)
Alkaline Phosphatase: 83 U/L (ref 39–117)
Bilirubin, Direct: 0.2 mg/dL (ref 0.0–0.3)
Total Bilirubin: 0.8 mg/dL (ref 0.2–1.2)
Total Protein: 6.1 g/dL (ref 6.0–8.3)

## 2024-03-14 LAB — CBC WITH DIFFERENTIAL/PLATELET
Basophils Absolute: 0 K/uL (ref 0.0–0.1)
Basophils Relative: 0.9 % (ref 0.0–3.0)
Eosinophils Absolute: 0.1 K/uL (ref 0.0–0.7)
Eosinophils Relative: 1.7 % (ref 0.0–5.0)
HCT: 37.6 % — ABNORMAL LOW (ref 39.0–52.0)
Hemoglobin: 12.6 g/dL — ABNORMAL LOW (ref 13.0–17.0)
Lymphocytes Relative: 30.7 % (ref 12.0–46.0)
Lymphs Abs: 1.1 K/uL (ref 0.7–4.0)
MCHC: 33.6 g/dL (ref 30.0–36.0)
MCV: 89.1 fl (ref 78.0–100.0)
Monocytes Absolute: 0.3 K/uL (ref 0.1–1.0)
Monocytes Relative: 8.9 % (ref 3.0–12.0)
Neutro Abs: 2.1 K/uL (ref 1.4–7.7)
Neutrophils Relative %: 57.8 % (ref 43.0–77.0)
Platelets: 116 K/uL — ABNORMAL LOW (ref 150.0–400.0)
RBC: 4.22 Mil/uL (ref 4.22–5.81)
RDW: 16.7 % — ABNORMAL HIGH (ref 11.5–15.5)
WBC: 3.6 K/uL — ABNORMAL LOW (ref 4.0–10.5)

## 2024-03-14 LAB — LIPID PANEL
Cholesterol: 75 mg/dL (ref 0–200)
HDL: 25.2 mg/dL — ABNORMAL LOW (ref >39.00)
LDL Cholesterol: 33 mg/dL (ref 0–99)
NonHDL: 49.31
Total CHOL/HDL Ratio: 3
Triglycerides: 80 mg/dL (ref 0.0–149.0)
VLDL: 16 mg/dL (ref 0.0–40.0)

## 2024-03-14 LAB — TSH: TSH: 3.02 u[IU]/mL (ref 0.35–5.50)

## 2024-03-14 LAB — PSA, MEDICARE: PSA: 0.62 ng/mL (ref 0.10–4.00)

## 2024-03-14 LAB — HEMOGLOBIN A1C: Hgb A1c MFr Bld: 5.7 % (ref 4.6–6.5)

## 2024-03-18 NOTE — Progress Notes (Unsigned)
 Phillip [name] T. Phillip Markovitz, MD, Phillip [name] [address] [place], [address]  Phone: [phone]  FAX: [phone]  Phillip [name] - 73 y.o. male  MRN [id number]  Date of Birth: August 05, 1951  Date: 03/21/2024  PCP: Watt Mirza, MD  Referral: Watt Mirza, MD  No chief complaint on file.  Patient Care Team: Watt Mirza, MD as PCP - General End, Lonni, MD as PCP - [facility] Subjective:   Phillip [name] is a 73 y.o. pleasant patient who presents with the following:  Preventative Health Maintenance Visit:  Health Maintenance Summary Reviewed and updated, unless pt declines services.  Tobacco History Reviewed. Alcohol: No concerns, no excessive use Exercise Habits: Some activity, rec at least 30 mins 5 times a week STD concerns: no risk or activity to increase risk Drug Use: None  Phillip [name] is a very well-known patient, known for many years.  He does have a history of coronary disease.  He has historically seen Dr. Mady.  History of PCI in November 2017 in the setting of unstable angina.  He has not been seen in almost 2 years.    Health Maintenance  Topic Date Due   COVID-19 Vaccine (7 - Pfizer risk 2024-25 season) 12/06/2023   INFLUENZA VACCINE  03/16/2024   Medicare Annual Wellness (AWV)  09/11/2024   Colonoscopy  11/01/2028   DTaP/Tdap/Td (3 - Td or Tdap) 10/17/2030   Pneumococcal Vaccine: 50+ Years  Completed   Hepatitis C Screening  Completed   Zoster Vaccines- Shingrix  Completed   Hepatitis B Vaccines  Aged Out   HPV VACCINES  Aged Out   Meningococcal B Vaccine  Aged Out   Immunization History  Administered Date(s) Administered   Fluad Quad(high Dose 65+) 05/09/2019   H1N1 09/26/2008   Influenza Whole 09/26/2008   Influenza, High Dose Seasonal PF 05/31/2017, 06/14/2018, 06/04/2021, 05/14/2022   Influenza,inj,Quad PF,6+ Mos 05/30/2014,  06/07/2015, 05/10/2016, 06/07/2023   Influenza-Unspecified 05/16/2020   Moderna Covid-19 Vaccine Bivalent Booster 46yrs & up 06/04/2021   PFIZER Comirnaty(Gray Top)Covid-19 Tri-Sucrose Vaccine 05/14/2022   PFIZER(Purple Top)SARS-COV-2 Vaccination 08/31/2019, 09/21/2019, 08/05/2020   Pfizer(Comirnaty)Fall Seasonal Vaccine 12 years and older 06/07/2023   Pneumococcal Conjugate-13 07/22/2016   Pneumococcal Polysaccharide-23 08/17/2007, 11/18/2017   Respiratory Syncytial Virus Vaccine,Recomb Aduvanted(Arexvy) 07/31/2022   Td 12/16/2008   Tdap 10/16/2020   Zoster Recombinant(Shingrix) 02/06/2022, 07/31/2022   Patient Active Problem List   Diagnosis Date Noted   Hyperlipidemia LDL goal <70 07/20/2017    Priority: Medium    Coronary artery disease involving native coronary artery of native heart without angina pectoris 11/02/2016    Priority: Medium    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 06/2016 Echo: EF 55-60%, no rwma, mild AI, triv MR/TR/PR, lipomatous hypertrophy of atrial septum;  b. 06/2016 Ex MV: EF 56%, large, partially reversible anteroseptal, inferoseptal, inferior,and apical perfusion defect -  borderline for ischemia (TID 1.64) - equivocal study;  c. 06/2016 PCI: LM nl, LAD 25p/74m (3.0x24 Promus Premier MR DES), LCX min irregs, RCA nl.   Colon polyps    a. Severe dysplasia in a right-sided polyp removed surgically in [place] , 2006--> final pathology showed this was not a cancer.   Follow up colonoscopy at one year interval was normal.   Erectile dysfunction    GERD (gastroesophageal reflux disease)    H/o Barrett's esophagus, orginally diagnosed in 2006, biopsies showed no dysplasia; follow up EGD also showed no dysplasia   History of BPH    Hyperlipidemia LDL goal <70 07/20/2017   Lyme disease    x2   Squamous cell carcinoma of skin    Face    Past Surgical History:  Procedure Laterality Date   CARDIAC CATHETERIZATION N/A 07/14/2016   Procedure: Left Heart Cath and Coronary Angiography;  Surgeon: Ozell Fell, MD;  Location: [name] INVASIVE CV LAB;  Service: Cardiovascular;  Laterality: N/A;   CARDIAC CATHETERIZATION N/A 07/14/2016   Procedure: Coronary Stent Intervention;  Surgeon: Ozell Fell, MD;  Location: [facility] INVASIVE CV LAB;  Service: Cardiovascular;  Laterality: N/A;   COLONOSCOPY  12/14/2008   Repeat 5 years   ESOPHAGOGASTRODUODENOSCOPY  12/14/2008   Barrett's, repeat 3 years   HEMICOLECTOMY  08/16/2004   Right, for severely dysplastic right colon polyp   HERNIA REPAIR  08/16/2006   UMBILICAL   LUMBAR DISC SURGERY  08/16/1990   L4-5   TONSILLECTOMY     As a child   TUMOR REMOVAL  08/16/2000   Fatty tumor (neck)   UPPER GASTROINTESTINAL ENDOSCOPY     VARICOSE VEIN SURGERY  08/17/1995   Removal, right   VEIN SURGERY  2022    Family History  Problem Relation Age of Onset   Lymphoma Mother    Hypertension Father    Diabetes Father    Heart disease Father    Alcohol abuse Paternal Grandfather    Colon cancer Neg Hx    Esophageal cancer Neg Hx    Rectal cancer Neg Hx    Stomach cancer Neg Hx    Colon polyps Neg Hx     Social History   Social History Narrative   Lives in [place] with his wife.  Ambulance person.    Past Medical History, Surgical History, Social History, Family History, Problem List, Medications, and Allergies have been reviewed and updated if relevant.  Review of Systems: Pertinent positives are  listed above.  Otherwise, a full 14 point review of systems has been done in full and it is negative except where it is noted positive.  Objective:   There were no vitals taken for this visit. Ideal Body Weight:    Ideal Body Weight:   No results found.    02/29/2024    9:06 AM 09/12/2023   11:07 AM 09/08/2022    2:01 PM 09/07/2021    2:03 PM 09/05/2020    2:11 PM  Depression screen PHQ 2/9  Decreased Interest 0 0 0 0 0  Down, Depressed, Hopeless 0 0 0 0 0  PHQ - 2 Score 0 0 0 0 0  Altered sleeping     0  Tired, decreased energy     0  Change in appetite     0  Feeling bad or failure about yourself      0  Trouble concentrating     0  Moving slowly or fidgety/restless     0  Suicidal thoughts     0  PHQ-9 Score     0  Difficult doing work/chores     Not difficult at all     GEN: well developed, well nourished, no acute distress Eyes: conjunctiva and lids normal, PERRLA, EOMI ENT: TM clear, nares clear, oral exam WNL Neck: supple, no lymphadenopathy, no thyromegaly, no JVD Pulm: clear to auscultation and percussion, respiratory effort normal CV: regular rate and rhythm, S1-S2, no murmur, rub or gallop, no bruits, peripheral pulses normal and symmetric, no cyanosis, clubbing, edema or varicosities GI: soft, non-tender; no hepatosplenomegaly, masses; active bowel sounds all quadrants GU: deferred Lymph: no cervical, axillary or inguinal adenopathy MSK: gait normal, muscle tone and strength WNL, no joint swelling, effusions, discoloration, crepitus  SKIN: clear, good turgor, color WNL, no rashes, lesions, or ulcerations Neuro: normal mental status, normal strength, sensation, and motion Psych: alert; oriented to person, place and time, normally interactive and not anxious or depressed in appearance.  All labs reviewed with patient. Results for orders placed or performed in visit on 03/14/24  TSH   Collection Time: 03/14/24  8:55 AM  Result Value Ref Range   TSH 3.02 0.35 -  5.50 uIU/mL  PSA, Medicare   Collection Time: 03/14/24  8:55 AM  Result Value Ref Range   PSA 0.62 0.10 - 4.00 ng/ml  Lipid panel   Collection Time: 03/14/24  8:55 AM  Result Value Ref Range   Cholesterol 75 0 - 200 mg/dL   Triglycerides 19.9 0.0 - 149.0 mg/dL   HDL 74.79 (L) >60.99 mg/dL   VLDL 83.9 0.0 - 59.9 mg/dL   LDL Cholesterol 33 0 - 99 mg/dL   Total CHOL/HDL Ratio 3    NonHDL 49.31   Hemoglobin A1c   Collection Time: 03/14/24  8:55 AM  Result Value Ref Range   Hgb A1c MFr Bld 5.7 4.6 - 6.5 %  Hepatic function panel   Collection Time: 03/14/24  8:55 AM  Result Value Ref Range   Total Bilirubin 0.8 0.2 - 1.2 mg/dL   Bilirubin, Direct 0.2 0.0 - 0.3 mg/dL   Alkaline Phosphatase 83 39 - 117 U/L   AST 20 0 - 37 U/L   ALT 15 0 - 53 U/L   Total Protein 6.1 6.0 - 8.3 g/dL   Albumin 3.9 3.5 - 5.2 g/dL  CBC with Differential/Platelet   Collection Time: 03/14/24  8:55 AM  Result Value Ref Range   WBC 3.6 (L) 4.0 - 10.5 K/uL   RBC 4.22 4.22 - 5.81 Mil/uL   Hemoglobin 12.6 (L) 13.0 - 17.0 g/dL   HCT 62.3 (L) 60.9 - 47.9 %   MCV 89.1 78.0 - 100.0 fl   MCHC 33.6 30.0 - 36.0 g/dL   RDW 83.2 (H) 88.4 - 84.4 %   Platelets 116.0 (L) 150.0 - 400.0 K/uL   Neutrophils Relative % 57.8 43.0 - 77.0 %   Lymphocytes Relative 30.7 12.0 - 46.0 %   Monocytes Relative 8.9 3.0 - 12.0 %   Eosinophils Relative 1.7 0.0 - 5.0 %   Basophils Relative 0.9 0.0 - 3.0 %   Neutro Abs 2.1 1.4 - 7.7 K/uL   Lymphs Abs 1.1 0.7 - 4.0 K/uL   Monocytes Absolute 0.3 0.1 - 1.0 K/uL   Eosinophils Absolute 0.1 0.0 - 0.7 K/uL   Basophils Absolute 0.0 0.0 - 0.1 K/uL  Basic metabolic panel   Collection Time: 03/14/24  8:55 AM  Result Value Ref Range   Sodium  140 135 - 145 mEq/L   Potassium 4.5 3.5 - 5.1 mEq/L   Chloride 106 96 - 112 mEq/L   CO2 28 19 - 32 mEq/L   Glucose, Bld 101 (H) 70 - 99 mg/dL   BUN 18 6 - 23 mg/dL   Creatinine, Ser 8.80 0.40 - 1.50 mg/dL   GFR 39.26 >39.99 mL/min   Calcium  9.0 8.4 -  10.5 mg/dL    Assessment and Plan:     ICD-10-CM   1. Healthcare maintenance  Z00.00       Health Maintenance Exam: The patient's preventative maintenance and recommended screening tests for an annual wellness exam were reviewed in full today. Brought up to date unless services declined.  Counselled on the importance of diet, exercise, and its role in overall health and mortality. The patient's FH and SH was reviewed, including their home life, tobacco status, and drug and alcohol status.  Follow-up in 1 year for physical exam or additional follow-up below.  Disposition: No follow-ups on file.  No orders of the defined types were placed in this encounter.  There are no discontinued medications. No orders of the defined types were placed in this encounter.   Signed,  Jacques DASEN. Kolten Ryback, MD   Allergies as of 03/21/2024       Reactions   Penicillins Other (See Comments)   REACTION: Rash as a child - Tolerated AMOXICILLIN  SEVERAL TIMES AS ADULT RECENTLY Has patient had a PCN reaction causing immediate rash, facial/tongue/throat swelling, SOB or lightheadedness with hypotension: YES Has patient had a PCN reaction causing severe rash involving mucus membranes or skin necrosis:NO Has patient had a PCN reaction that required hospitalization NO Has patient had a PCN reaction occurring within the last 10 years: NO If all of the above answers are NO, then may proceed with         Medication List        Accurate as of March 18, 2024  2:00 PM. If you have any questions, ask your nurse or doctor.          aspirin  EC 81 MG tablet Take 1 tablet (81 mg total) by mouth daily.   atorvastatin  10 MG tablet Commonly known as: LIPITOR TAKE 1 TABLET BY MOUTH EVERY DAY   cephALEXin  500 MG capsule Commonly known as: KEFLEX  Take 1 capsule (500 mg total) by mouth 3 (three) times daily.   cholecalciferol 10 MCG/ML Liqd oral liquid Commonly known as: VITAMIN D3 Take 400 Units by  mouth daily.   CO Q-10 PO Take 1 tablet by mouth daily.   cyanocobalamin  500 MCG tablet Commonly known as: VITAMIN B12 Take 1 tablet (500 mcg total) by mouth daily.   cyclobenzaprine  10 MG tablet Commonly known as: FLEXERIL  TAKE 1/2 TO 1 TABLET (5-10 MG TOTAL) BY MOUTH EVERY DAY AT BEDTIME AS NEEDED FOR MUSCLE SPASM   GLUCOSAMINE PO Take 1,500 mg by mouth daily.   Magnesium 250 MG Tabs Take by mouth.   naproxen sodium 220 MG tablet Commonly known as: ALEVE Take 220 mg by mouth as needed.   nitroGLYCERIN  0.4 MG SL tablet Commonly known as: NITROSTAT  PLACE 1 TABLET UNDER THE TONGUE EVERY 5 MINUTES AS NEEDED FOR CHEST PAIN. MAX 3 DOSES   omeprazole 20 MG tablet Commonly known as: PRILOSEC OTC Take 20 mg by mouth every other day.   omeprazole 40 MG capsule Commonly known as: PRILOSEC Take 40 mg by mouth every other day.   oseltamivir  75 MG capsule Commonly  known as: Tamiflu  Take 1 capsule (75 mg total) by mouth 2 (two) times daily.   saw palmetto  80 MG capsule Take 80 mg by mouth daily.   Sodium Fluoride 5000 Sensitive 1.1-5 % Gel Generic drug: Sod Fluoride-Potassium Nitrate   triamcinolone  cream 0.1 % Commonly known as: KENALOG  Apply 1 Application topically 2 (two) times daily.   True Vitamin B3 500 MG tablet Generic drug: niacin Take 500 mg by mouth at bedtime.   VARICOSE VEINS FORMULA PO Take 2 tablets by mouth daily.   VITAMIN C PO Take 1,000 mg by mouth daily.   ZINC PO Take 1 tablet by mouth daily.

## 2024-03-20 ENCOUNTER — Other Ambulatory Visit: Payer: Self-pay | Admitting: Family Medicine

## 2024-03-21 ENCOUNTER — Ambulatory Visit (INDEPENDENT_AMBULATORY_CARE_PROVIDER_SITE_OTHER): Admitting: Family Medicine

## 2024-03-21 ENCOUNTER — Encounter: Payer: Self-pay | Admitting: Family Medicine

## 2024-03-21 VITALS — BP 120/70 | HR 61 | Temp 97.8°F | Ht 72.0 in | Wt 215.2 lb

## 2024-03-21 DIAGNOSIS — Z0000 Encounter for general adult medical examination without abnormal findings: Secondary | ICD-10-CM | POA: Diagnosis not present

## 2024-03-21 DIAGNOSIS — R7303 Prediabetes: Secondary | ICD-10-CM

## 2024-04-17 ENCOUNTER — Encounter: Payer: Self-pay | Admitting: Cardiology

## 2024-04-17 ENCOUNTER — Ambulatory Visit: Attending: Cardiology | Admitting: Cardiology

## 2024-04-17 VITALS — BP 126/74 | HR 54 | Ht 73.0 in | Wt 214.2 lb

## 2024-04-17 DIAGNOSIS — R072 Precordial pain: Secondary | ICD-10-CM | POA: Diagnosis not present

## 2024-04-17 DIAGNOSIS — E785 Hyperlipidemia, unspecified: Secondary | ICD-10-CM

## 2024-04-17 DIAGNOSIS — R001 Bradycardia, unspecified: Secondary | ICD-10-CM

## 2024-04-17 DIAGNOSIS — I2510 Atherosclerotic heart disease of native coronary artery without angina pectoris: Secondary | ICD-10-CM | POA: Diagnosis not present

## 2024-04-17 DIAGNOSIS — R0602 Shortness of breath: Secondary | ICD-10-CM

## 2024-04-17 NOTE — Progress Notes (Signed)
 Cardiology Office Note   Date:  04/17/2024  ID:  [name], DOB 25-Jan-1951, MRN [id number] PCP: Watt Mirza, MD  [facility] Providers Cardiologist:  Lonni Hanson, MD     History of Present Illness Phillip [name] is a 73 y.o. male with past medical history of coronary disease status post PCI to the mid LAD in the setting of unstable angina (06/2016), Barrett's esophagus, venous insufficiency, COVID infection (03/2020, hyperlipidemia, GERD, presents today for follow-up and concerns for recent complaints of chest discomfort.   Prior echocardiogram completed 06/2016 showed normal LV size and function.  LVEF 55 to 60%.  Mild AI, and trivial MR.  He was evaluated in December 2018 with occasional neck and chest pain during stressful situations.  He does exercise regularly without anginal symptoms.  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  He had normal exercise tolerance test which was considered low risk without evidence of ischemia.  He suffered from COVID in 03/2020.  Was followed up with cardiology in 04/2019 and was feeling well with only mild residual symptoms.  There were no medication changes that were made and further testing was needed at that time.  He was again evaluated in 05/2020 he was stable from a cardiac perspective.  He was evaluated in clinic 05/2021 with no concerns.  EKG showed bradycardia.  There were no changes made to his medication regimen at that time as he was considered to be doing well from the cardiac perspective.   He was last seen in clinic 07/2022 by Dr. Hanson.  At he continued to feel well.  He noticed that his resting heart rate was a little bit low at that she often in the 50s however he denies associated symptoms.  There were no medication changes that were made and no further testing that was ordered at that time.  He returns to clinic today stating that he has had several  episodes of chest discomfort that would last for 1 to 2 minutes and then spontaneously resolve.  He stated that more of a heavy sensation.  He has not required the use of any Nitrostat .  States that primarily episodes happen with rest.  He has noted as of late that he has shortness of breath and dyspnea on exertion primarily with stairs that are different from his normal.  He does note that on occasion he has suffered from lightheadedness or dizziness.  Denies any syncope or near syncope.  He continues to remain active playing golf and has not had any reoccurrence of symptoms.  States that he has been compliant with his current medication regimen without any undue side effects.  Denies any hospitalizations or visits to the emergency department.  ROS: 10 point review of systems has been reviewed and considered negative except ones were listed in the HPI  Studies Reviewed EKG Interpretation Date/Time:  Tuesday April 17 2024 08:04:24 EDT Ventricular Rate:  54 PR Interval:  168 QRS Duration:  80 QT Interval:  424 QTC Calculation: 402 R Axis:   16  Text Interpretation: Sinus bradycardia When compared with ECG of 15-Jul-2016 05:06, No significant change since last tracing Confirmed by Gerard Frederick ([id number]) on 04/17/2024 8:06:38 AM    Cardiovascular History & Procedures: Cardiovascular Problems: Coronary artery disease status post PCI to the mid LAD in the setting of unstable angina (06/2016) Chronic venous insufficiency   Risk Factors: Known coronary artery disease, male  gender, and age greater than 44   Cath/PCI: LHC/PCI (07/14/16): LMCA normal. Ostial LAD with 25% stenosis. Mid LAD with 95% lesion. LCx with minimal diffuse disease. RCA normal. Successful PCI to the mid LAD with placement of a Promus Premier 3.0 x 24 mm drug-eluting stent.   CV Surgery: None   EP Procedures and Devices: None   Non-Invasive Evaluation(s): Exercise tolerance test (08/02/2017): Low risk study without  ischemia. TTE (07/13/2016): Normal LV size and function. LVEF 55-60%. Mild AI. Trivial MR. Lipomatous hypertrophy of the intra-atrial septum. Trivial TR. Trivial PR. Normal RV size and function. Risk Assessment/Calculations           Physical Exam VS:  BP 126/74 (BP Location: Left Arm, Patient Position: Sitting, Cuff Size: Normal)   Pulse (!) 54   Ht 6' 1 (1.854 m)   Wt 214 lb 4 oz (97.2 kg)   SpO2 99%   BMI 28.27 kg/m        Wt Readings from Last 3 Encounters:  04/17/24 214 lb 4 oz (97.2 kg)  03/21/24 215 lb 4 oz (97.6 kg)  02/29/24 216 lb 6 oz (98.1 kg)    GEN: Well nourished, well developed in no acute distress NECK: No JVD; No carotid bruits CARDIAC: RRR, bradycardic no murmurs, rubs, gallops RESPIRATORY:  Clear to auscultation without rales, wheezing or rhonchi  ABDOMEN: Soft, non-tender, non-distended EXTREMITIES:  No edema; No deformity   ASSESSMENT AND PLAN Precordial pain that has been coming and going on for the last several months the last 1 to 2 minutes.  Typically happens with rest and not exertion.  Has not required the use of Nitrostat .  Is concerning as it is new symptomology. prior history of coronary artery disease with stent placement approximately 8 months ago.  With his history he has been scheduled for a cardiac PET stress to evaluate for ischemic causes for his chest discomfort.  Shortness of breath and dyspnea on exertion primarily on stairs.  He has been scheduled for an updated echocardiogram to evaluate changes in LVEF.  Coronary artery disease with prior stenting to the mid LAD in 2017.  He has been continued on secondary prevention with aspirin  81 mg daily and atorvastatin  10 mg daily.  EKG today reveals sinus bradycardia with no ischemic changes.  Hyperlipidemia with last LDL of 33.  Remains at goal.  Continue on atorvastatin  10 mg daily.  Sinus bradycardia essentially asymptomatic with only occasional lightheadedness and dizziness.  TSH was normal.   Given lack of symptoms and reported no more chronotropic response to exercise and no further intervention is indicated at this time.  If he does become symptomatic we will likely need an ZIO XT to rule out arrhythmia or pulse.    Informed Consent   Shared Decision Making/Informed Consent The risks [chest pain, shortness of breath, cardiac arrhythmias, dizziness, blood pressure fluctuations, myocardial infarction, stroke/transient ischemic attack, nausea, vomiting, allergic reaction, radiation exposure, metallic taste sensation and life-threatening complications (estimated to be 1 in 10,000)], benefits (risk stratification, diagnosing coronary artery disease, treatment guidance) and alternatives of a cardiac PET stress test were discussed in detail with Mr. Colford and he agrees to proceed.     Dispo: Patient to return to clinic to see MD/APP in 2 months or sooner if needed for further evaluation after testing is completed.  Testing to be scheduled in [place], as it is closer to the patient.  Signed, Muhsin Doris, NP

## 2024-04-17 NOTE — Patient Instructions (Signed)
 Medication Instructions:  Your physician recommends that you continue on your current medications as directed. Please refer to the Current Medication list given to you today.   *If you need a refill on your cardiac medications before your next appointment, please call your pharmacy*  Lab Work: No labs ordered today  If you have labs (blood work) drawn today and your tests are completely normal, you will receive your results only by: MyChart Message (if you have MyChart) OR A paper copy in the mail If you have any lab test that is abnormal or we need to change your treatment, we will call you to review the results.  Testing/Procedures: Your physician has requested that you have an echocardiogram. Echocardiography is a painless test that uses sound waves to create images of your heart. It provides your doctor with information about the size and shape of your heart and how well your heart's chambers and valves are working.   You may receive an ultrasound enhancing agent through an IV if needed to better visualize your heart during the echo. This procedure takes approximately one hour.  There are no restrictions for this procedure.  This will take place at [facility].  Please note: We ask at that you not bring children with you during ultrasound (echo/ vascular) testing. Due to room size and safety concerns, children are not allowed in the ultrasound rooms during exams. Our front office staff cannot provide observation of children in our lobby area while testing is being conducted. An adult accompanying a patient to their appointment will only be allowed in the ultrasound room at the discretion of the ultrasound technician under special circumstances. We apologize for any inconvenience.      Please report to Radiology at the [facility] Main Entrance 30 minutes early for your test.  [address] [place], [place] [address]                        How to Prepare for  Your Cardiac PET/CT Stress Test:  Nothing to eat or drink, except water, 3 hours prior to arrival time.  NO caffeine/decaffeinated products, or chocolate 12 hours prior to arrival. (Please note decaffeinated beverages (teas/coffees) still contain caffeine).  If you have caffeine within 12 hours prior, the test will need to be rescheduled.  Medication instructions: Do not take erectile dysfunction medications for 72 hours prior to test (sildenafil , tadalafil) Do not take nitrates (isosorbide mononitrate, Ranexa) the day before or day of test Do not take tamsulosin the day before or morning of test Hold theophylline containing medications for 12 hours. Hold Dipyridamole 48 hours prior to the test.  Diabetic Preparation: If able to eat breakfast prior to 3 hour fasting, you may take all medications, including your insulin. Do not worry if you miss your breakfast dose of insulin - start at your next meal. If you do not eat prior to 3 hour fast-Hold all diabetes (oral and insulin) medications. Patients who wear a continuous glucose monitor MUST remove the device prior to scanning.  You may take your remaining medications with water.  NO perfume, cologne or lotion on chest or abdomen area. FEMALES - Please avoid wearing dresses to this appointment.  Total time is 1 to 2 hours; you may want to bring reading material for the waiting time.  IF YOU THINK YOU MAY BE PREGNANT, OR ARE NURSING PLEASE INFORM THE TECHNOLOGIST.  In preparation for your appointment, medication and supplies  will be purchased.  Appointment availability is limited, so if you need to cancel or reschedule, please call the Radiology Department Scheduler at [phone] 24 hours in advance to avoid a cancellation fee of $100.00  What to Expect When you Arrive:  Once you arrive and check in for your appointment, you will be taken to a preparation room within the Radiology Department.  A technologist or Nurse will obtain your  medical history, verify that you are correctly prepped for the exam, and explain the procedure.  Afterwards, an IV will be started in your arm and electrodes will be placed on your skin for EKG monitoring during the stress portion of the exam. Then you will be escorted to the PET/CT scanner.  There, staff will get you positioned on the scanner and obtain a blood pressure and EKG.  During the exam, you will continue to be connected to the EKG and blood pressure machines.  A small, safe amount of a radioactive tracer will be injected in your IV to obtain a series of pictures of your heart along with an injection of a stress agent.    After your Exam:  It is recommended that you eat a meal and drink a caffeinated beverage to counter act any effects of the stress agent.  Drink plenty of fluids for the remainder of the day and urinate frequently for the first couple of hours after the exam.  Your doctor will inform you of your test results within 7-10 business days.  For more information and frequently asked questions, please visit our website: [email or web address]  For questions about your test or how to prepare for your test, please call: Cardiac Imaging Nurse Navigators Office: [phone]   Follow-Up: At [facility], you and your health needs are our priority.  As part of our continuing mission to provide you with exceptional heart care, our providers are all part of one team.  This team includes your primary Cardiologist (physician) and Advanced Practice Providers or APPs (Physician Assistants and Nurse Practitioners) who all work together to provide you with the care you need, when you need it.  Your next appointment:   2 month(s)  Provider:

## 2024-05-18 ENCOUNTER — Encounter (HOSPITAL_COMMUNITY): Payer: Self-pay

## 2024-05-21 DIAGNOSIS — I87393 Chronic venous hypertension (idiopathic) with other complications of bilateral lower extremity: Secondary | ICD-10-CM | POA: Diagnosis not present

## 2024-05-21 DIAGNOSIS — I8311 Varicose veins of right lower extremity with inflammation: Secondary | ICD-10-CM | POA: Diagnosis not present

## 2024-05-21 DIAGNOSIS — M79661 Pain in right lower leg: Secondary | ICD-10-CM | POA: Diagnosis not present

## 2024-05-21 DIAGNOSIS — I8312 Varicose veins of left lower extremity with inflammation: Secondary | ICD-10-CM | POA: Diagnosis not present

## 2024-05-22 ENCOUNTER — Ambulatory Visit (HOSPITAL_COMMUNITY)
Admission: RE | Admit: 2024-05-22 | Discharge: 2024-05-22 | Disposition: A | Discharge status: Home / Self Care | Source: Ambulatory Visit | Attending: Cardiology | Admitting: Cardiology

## 2024-05-22 DIAGNOSIS — R072 Precordial pain: Secondary | ICD-10-CM | POA: Insufficient documentation

## 2024-05-22 MED ORDER — RUBIDIUM RB82 GENERATOR (RUBYFILL)
24.9700 | PACK | Freq: Once | INTRAVENOUS | Status: AC
  Administered 2024-05-22: 24.97 via INTRAVENOUS

## 2024-05-22 MED ORDER — REGADENOSON 0.4 MG/5ML IV SOLN
0.4000 mg | Freq: Once | INTRAVENOUS | Status: AC
  Administered 2024-05-22: 0.4 mg via INTRAVENOUS

## 2024-05-22 MED ORDER — REGADENOSON 0.4 MG/5ML IV SOLN
INTRAVENOUS | Status: AC
  Filled 2024-05-22: qty 5

## 2024-05-22 MED ORDER — RUBIDIUM RB82 GENERATOR (RUBYFILL)
24.7000 | PACK | Freq: Once | INTRAVENOUS | Status: AC
  Administered 2024-05-22: 24.7 via INTRAVENOUS

## 2024-05-22 NOTE — Progress Notes (Signed)
 Pt. Tolerated lexi scan well.

## 2024-05-23 ENCOUNTER — Ambulatory Visit: Payer: Self-pay | Admitting: Cardiology

## 2024-05-23 DIAGNOSIS — I77810 Thoracic aortic ectasia: Secondary | ICD-10-CM

## 2024-05-23 LAB — NM PET CT CARDIAC PERFUSION MULTI W/ABSOLUTE BLOODFLOW
MBFR: 2.03
Nuc Rest EF: 52 %
Nuc Stress EF: 69 %
Rest MBF: 0.62 ml/g/min
Rest Nuclear Isotope Dose: 25 mCi
ST Depression (mm): 0 mm
Stress MBF: 1.26 ml/g/min
Stress Nuclear Isotope Dose: 24.7 mCi

## 2024-05-23 NOTE — Progress Notes (Signed)
 LV  Perfusion is normal.  Study is normal and considered low risk.  No extra cardiac findings noted on CT overread.

## 2024-05-28 ENCOUNTER — Ambulatory Visit (HOSPITAL_COMMUNITY)
Admission: RE | Admit: 2024-05-28 | Discharge: 2024-05-28 | Disposition: A | Discharge status: Home / Self Care | Source: Ambulatory Visit | Attending: Cardiovascular Disease | Admitting: Cardiovascular Disease

## 2024-05-28 DIAGNOSIS — R0602 Shortness of breath: Secondary | ICD-10-CM | POA: Diagnosis not present

## 2024-05-28 LAB — ECHOCARDIOGRAM COMPLETE
Area-P 1/2: 2.21 cm2
P 1/2 time: 801 ms
S' Lateral: 3 cm

## 2024-06-17 ENCOUNTER — Other Ambulatory Visit: Payer: Self-pay | Admitting: Family Medicine

## 2024-06-17 DIAGNOSIS — E785 Hyperlipidemia, unspecified: Secondary | ICD-10-CM

## 2024-06-18 DIAGNOSIS — I87392 Chronic venous hypertension (idiopathic) with other complications of left lower extremity: Secondary | ICD-10-CM | POA: Diagnosis not present

## 2024-06-19 ENCOUNTER — Ambulatory Visit
Admission: RE | Admit: 2024-06-19 | Discharge: 2024-06-19 | Disposition: A | Discharge status: Home / Self Care | Source: Ambulatory Visit | Attending: Cardiology | Admitting: Cardiology

## 2024-06-19 DIAGNOSIS — I77810 Thoracic aortic ectasia: Secondary | ICD-10-CM | POA: Diagnosis not present

## 2024-06-19 DIAGNOSIS — I77819 Aortic ectasia, unspecified site: Secondary | ICD-10-CM | POA: Diagnosis not present

## 2024-06-19 MED ORDER — IOHEXOL 350 MG/ML SOLN
100.0000 mL | Freq: Once | INTRAVENOUS | Status: AC | PRN
  Administered 2024-06-19: 100 mL via INTRAVENOUS

## 2024-06-20 ENCOUNTER — Encounter: Payer: Self-pay | Admitting: Cardiology

## 2024-06-20 ENCOUNTER — Ambulatory Visit: Payer: Self-pay | Admitting: Cardiology

## 2024-06-20 ENCOUNTER — Ambulatory Visit: Attending: Cardiology | Admitting: Cardiology

## 2024-06-20 VITALS — BP 120/70 | HR 64 | Ht 73.0 in | Wt 220.4 lb

## 2024-06-20 DIAGNOSIS — E785 Hyperlipidemia, unspecified: Secondary | ICD-10-CM

## 2024-06-20 DIAGNOSIS — I25118 Atherosclerotic heart disease of native coronary artery with other forms of angina pectoris: Secondary | ICD-10-CM | POA: Diagnosis not present

## 2024-06-20 DIAGNOSIS — R0789 Other chest pain: Secondary | ICD-10-CM

## 2024-06-20 DIAGNOSIS — I77810 Thoracic aortic ectasia: Secondary | ICD-10-CM | POA: Diagnosis not present

## 2024-06-20 DIAGNOSIS — R0602 Shortness of breath: Secondary | ICD-10-CM

## 2024-06-20 DIAGNOSIS — R001 Bradycardia, unspecified: Secondary | ICD-10-CM

## 2024-06-20 MED ORDER — NITROGLYCERIN 0.4 MG SL SUBL
SUBLINGUAL_TABLET | SUBLINGUAL | 2 refills | Status: AC
Start: 2024-06-20 — End: ?

## 2024-06-20 NOTE — Progress Notes (Signed)
 Cardiology Office Note   Date:  06/20/2024  ID:  Phillip [name], DOB 1950-11-10, MRN [id number] PCP: Watt Mirza, MD  [facility] Providers Cardiologist:  Lonni Hanson, MD     History of Present Illness Phillip [name] is a 73 y.o. male with a past medical history of coronary artery disease status post PCI to the mid LAD in setting of unstable angina (06/2019), Barrett's esophagus, venous insufficiency, COVID infection (03/2020), hyperlipidemia, GERD, who presents today for follow-up.   Prior echocardiogram completed 06/2016 showed normal LV size and function.  LVEF 55 to 60%.  Mild AI, and trivial MR.  He was evaluated in December 2018 with occasional neck and chest pain during stressful situations.  He does exercise regularly without anginal symptoms.  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  He had normal exercise tolerance test which was considered low risk without evidence of ischemia.  He suffered from COVID in 03/2020.  Was followed up with cardiology in 04/2019 and was feeling well with only mild residual symptoms.  There were no medication changes that were made and further testing was needed at that time.  He was again evaluated in 05/2020 he was stable from a cardiac perspective.  He was evaluated in clinic 05/2021 with no concerns.  EKG showed bradycardia.  There were no changes made to his medication regimen at that time as he was considered to be doing well from the cardiac perspective.  He was last seen in clinic 07/2022 by Dr. Hanson.  He continued to feel well.  Resting heart rate is little bit lower in the 50s however denied any associated symptoms.  There were no medication changes that were made and no further testing that was ordered at the time.   He was last seen in clinic 04/17/2024 stating he had several episodes of chest discomfort that would last for 1 to 2 minutes and then spontaneously  resolved.  He stated that they were more of a heaviness sensation and had not required the use of Nitrostat .  Symptoms primarily happen with rest.  He also noted as of late shortness of breath and dyspnea exertion primarily with stairs that were different from his norm.  He was scheduled for a cardiac PET stress to evaluate ischemic causes of the chest discomfort as well as an updated echocardiogram.  He returns to clinic today stating that overall he has been doing fairly well.  He denies any reoccurrence of chest discomfort.  Has not required the use of any Nitrostat .  Denies any shortness of breath dyspnea on exertion peripheral edema.  Continues to have vein treatment done to his bilateral lower extremities with sclerotherapy for venous insufficiency.  Continues to remain active.  He has been compliant with his current medication regimen without any undue side effects.  Denies any hospitalizations or visits to the emergency department.  He has questions related to prior testing.  ROS: 10 point review of systems has been reviewed and considered negative the exception was been listed in the HPI  Studies Reviewed     2d echo 05/28/2024 1. Left ventricular ejection fraction, by estimation, is 60 to 65%. The  left ventricle has normal function. The left ventricle has no regional  wall motion abnormalities. Left ventricular diastolic parameters were  normal. The average left ventricular  global longitudinal strain is -18.5 %. The global longitudinal strain is  normal.   2.  Right ventricular systolic function is normal. The right ventricular  size is normal. There is mildly elevated pulmonary artery systolic  pressure.   3. Left atrial size was moderately dilated.   4. The mitral valve is abnormal. Trivial mitral valve regurgitation. No  evidence of mitral stenosis.   5. The aortic valve is tricuspid. Aortic valve regurgitation is mild. No  aortic stenosis is present.   6. Aortic dilatation  noted. There is moderate dilatation of the aortic  root, measuring 42 mm. There is mild dilatation of the ascending aorta,  measuring 38 mm.   7. The inferior vena cava is normal in size with greater than 50%  respiratory variability, suggesting right atrial pressure of 3 mmHg.   cPET stress 05/22/2024   LV perfusion is normal.   Rest left ventricular function is normal. Rest EF: 52%. Stress left ventricular function is normal. Stress EF: 69%. End diastolic cavity size is normal. End systolic cavity size is normal.   Myocardial blood flow was computed to be 0.62ml/g/min at rest and 1.26ml/g/min at stress. Global myocardial blood flow reserve was 2.03 and was normal.   Coronary calcium  assessment not performed due to prior revascularization.   The study is normal. The study is low risk.  Cardiovascular History & Procedures: Cardiovascular Problems: Coronary artery disease status post PCI to the mid LAD in the setting of unstable angina (06/2016) Chronic venous insufficiency   Risk Factors: Known coronary artery disease, male gender, and age greater than 1   Cath/PCI: LHC/PCI (07/14/16): LMCA normal. Ostial LAD with 25% stenosis. Mid LAD with 95% lesion. LCx with minimal diffuse disease. RCA normal. Successful PCI to the mid LAD with placement of a Promus Premier 3.0 x 24 mm drug-eluting stent.   CV Surgery: None   EP Procedures and Devices: None   Non-Invasive Evaluation(s): Exercise tolerance test (08/02/2017): Low risk study without ischemia. TTE (07/13/2016): Normal LV size and function. LVEF 55-60%. Mild AI. Trivial MR. Lipomatous hypertrophy of the intra-atrial septum. Trivial TR. Trivial PR. Normal RV size and function.  Risk Assessment/Calculations           Physical Exam VS:  BP 120/70 (BP Location: Left Arm, Patient Position: Sitting, Cuff Size: Normal)   Pulse 64   Ht 6' 1 (1.854 m)   Wt 220 lb 6 oz (100 kg)   SpO2 99%   BMI 29.07 kg/m        Wt Readings from  Last 3 Encounters:  06/20/24 220 lb 6 oz (100 kg)  04/17/24 214 lb 4 oz (97.2 kg)  03/21/24 215 lb 4 oz (97.6 kg)    GEN: Well nourished, well developed in no acute distress NECK: No JVD; No carotid bruits CARDIAC: RRR, no murmurs, rubs, gallops RESPIRATORY:  Clear to auscultation without rales, wheezing or rhonchi  ABDOMEN: Soft, non-tender, non-distended EXTREMITIES: Trace pretibial edema; No deformity   ASSESSMENT AND PLAN Precordial pain that is resolved since his last visit.  Coronary CTA was completed which revealed LV perfusion that was normal, myocardial blood flow was considered normal.  The study was low risk scan.  He has been sent in a new prescription for Nitrostat  today per his request as the Nitrostat  he is carrying has expired.  Shortness of breath dyspnea on exertion that has improved.  Echocardiogram revealed an LVEF of 60 to 65%, no RWMA, right ventricular systolic function is normal and normal in size, there is mildly elevated pulmonary artery systolic pressure, left atrial size is moderately dilated,  trivial mitral valve regurgitation, aortic valve regurgitation is mild, no aortic valve stenosis is present, aortic dilatation was noted with mild dilatation of the aortic root measuring 42 mm there is mild dilatation of the ascending aorta measuring 38 mm.  With new finding of aortic root and ascending aorta dilatation he was sent for a CT of the aorta.  Unfortunately those results have not returned prior to his appointment we have called radiology for report.  Coronary artery disease with prior stenting to the mid LAD in 2017.  He is continued on secondary prevention with aspirin  81 mg daily and atorvastatin  10 mg daily.  Recent cardiac PET stress was low risk with normal myocardial blood flow.  Hyperlipidemia with last LDL 33.  Remains at goal.  Continued on atorvastatin  10 mg daily.  Aortic root dilatation noted on echocardiogram.  He was sent for CT angio of the chest on  with continue to wait for read as report is not back yet.  We have called radiology for the report and will call the patient with findings when the report is available.  Sinus bradycardia where he remains asymptomatic given lack of symptoms and being chronotropic appropriate will defer ZIO monitoring at this time       Dispo: Patient to return to clinic to see MD/APP in 6 months or sooner if needed for further evaluation  Signed, Norwood Quezada, NP

## 2024-06-20 NOTE — Patient Instructions (Signed)
 Medication Instructions:  Your physician recommends that you continue on your current medications as directed. Please refer to the Current Medication list given to you today.   *If you need a refill on your cardiac medications before your next appointment, please call your pharmacy*  Lab Work: No labs ordered today  If you have labs (blood work) drawn today and your tests are completely normal, you will receive your results only by: MyChart Message (if you have MyChart) OR A paper copy in the mail If you have any lab test that is abnormal or we need to change your treatment, we will call you to review the results.  Testing/Procedures: No test ordered today   Follow-Up: At [facility], you and your health needs are our priority.  As part of our continuing mission to provide you with exceptional heart care, our providers are all part of one team.  This team includes your primary Cardiologist (physician) and Advanced Practice Providers or APPs (Physician Assistants and Nurse Practitioners) who all work together to provide you with the care you need, when you need it.  Your next appointment:   6 month(s)  Provider:   You may see Lonni Hanson, MD or one of the following Advanced Practice Providers on your designated Care Team:   Tylene Lunch, NP

## 2024-06-25 DIAGNOSIS — I87391 Chronic venous hypertension (idiopathic) with other complications of right lower extremity: Secondary | ICD-10-CM | POA: Diagnosis not present

## 2024-07-02 DIAGNOSIS — I87392 Chronic venous hypertension (idiopathic) with other complications of left lower extremity: Secondary | ICD-10-CM | POA: Diagnosis not present

## 2024-07-11 DIAGNOSIS — I87391 Chronic venous hypertension (idiopathic) with other complications of right lower extremity: Secondary | ICD-10-CM | POA: Diagnosis not present

## 2024-07-18 ENCOUNTER — Encounter: Payer: Self-pay | Admitting: Physician Assistant

## 2024-07-18 ENCOUNTER — Ambulatory Visit: Admitting: Physician Assistant

## 2024-07-18 VITALS — BP 106/67

## 2024-07-18 DIAGNOSIS — L578 Other skin changes due to chronic exposure to nonionizing radiation: Secondary | ICD-10-CM | POA: Diagnosis not present

## 2024-07-18 DIAGNOSIS — D1801 Hemangioma of skin and subcutaneous tissue: Secondary | ICD-10-CM | POA: Diagnosis not present

## 2024-07-18 DIAGNOSIS — L821 Other seborrheic keratosis: Secondary | ICD-10-CM | POA: Diagnosis not present

## 2024-07-18 DIAGNOSIS — W908XXA Exposure to other nonionizing radiation, initial encounter: Secondary | ICD-10-CM | POA: Diagnosis not present

## 2024-07-18 DIAGNOSIS — Z8589 Personal history of malignant neoplasm of other organs and systems: Secondary | ICD-10-CM

## 2024-07-18 DIAGNOSIS — Z85828 Personal history of other malignant neoplasm of skin: Secondary | ICD-10-CM

## 2024-07-18 DIAGNOSIS — D229 Melanocytic nevi, unspecified: Secondary | ICD-10-CM

## 2024-07-18 DIAGNOSIS — L814 Other melanin hyperpigmentation: Secondary | ICD-10-CM

## 2024-07-18 DIAGNOSIS — Z1283 Encounter for screening for malignant neoplasm of skin: Secondary | ICD-10-CM

## 2024-07-18 NOTE — Patient Instructions (Signed)

## 2024-07-18 NOTE — Progress Notes (Signed)
   Follow-Up Visit   Subjective  Phillip [name] is a 73 y.o. male ESTABLISHED PATIENT who presents for the following: Skin Cancer Screening and Upper Body Skin Exam - History of SCC.   He is also here for an AK follow up of his right ear and right cheek treated with LN2.  The patient presents for Upper Body Skin Exam (UBSE) for skin cancer screening and mole check. The patient has spots, moles and lesions to be evaluated, some may be new or changing and the patient may have concern these could be cancer.    The following portions of the chart were reviewed this encounter and updated as appropriate: medications, allergies, medical history  Review of Systems:  No other skin or systemic complaints except as noted in HPI or Assessment and Plan.  Objective  Well appearing patient in no apparent distress; mood and affect are within normal limits.  All skin waist up examined. Relevant physical exam findings are noted in the Assessment and Plan.    Assessment & Plan   HISTORY OF SQUAMOUS CELL CARCINOMA   LENTIGINES   ACTINIC SKIN DAMAGE   CHERRY ANGIOMA   MULTIPLE BENIGN NEVI   SKIN CANCER SCREENING   Skin cancer screening performed today.  Actinic Damage - Chronic condition, secondary to cumulative UV/sun exposure - diffuse scaly erythematous macules with underlying dyspigmentation - Recommend daily broad spectrum sunscreen SPF 30+ to sun-exposed areas, reapply every 2 hours as needed.  - Staying in the shade or wearing long sleeves, sun glasses (UVA+UVB protection) and wide brim hats (4-inch brim around the entire circumference of the hat) are also recommended for sun protection.  - Call for new or changing lesions.  Lentigines, Seborrheic Keratoses, Hemangiomas - Benign normal skin lesions - Benign-appearing - Call for any changes  Melanocytic Nevi - Tan-brown and/or pink-flesh-colored symmetric macules and papules - Benign appearing on exam today - Observation -  Call clinic for new or changing moles - Recommend daily use of broad spectrum spf 30+ sunscreen to sun-exposed areas.   HISTORY OF SQUAMOUS CELL CARCINOMA OF FACE - No evidence of recurrence today - No lymphadenopathy - Recommend regular full body skin exams - Recommend daily broad spectrum sunscreen SPF 30+ to sun-exposed areas, reapply every 2 hours as needed.  - Call if any new or changing lesions are noted between office visits     Return in about 9 months (around 04/18/2025) for TBSE.  I, Roseline Hutchinson, CMA, am acting as scribe for Nandini Bogdanski K, PA-C .   Documentation: I have reviewed the above documentation for accuracy and completeness, and I agree with the above.  Adain Geurin K, PA-C

## 2024-10-12 ENCOUNTER — Ambulatory Visit: Payer: Medicare HMO

## 2025-01-02 ENCOUNTER — Ambulatory Visit: Admitting: Cardiology

## 2025-04-24 ENCOUNTER — Ambulatory Visit: Admitting: Physician Assistant
